# Patient Record
Sex: Female | Born: 1950 | Race: White | Hispanic: No | Marital: Single
Health system: Midwestern US, Community
[De-identification: ages and names within clinical notes are randomized; demographics above are authoritative.]

## PROBLEM LIST (undated history)

## (undated) DIAGNOSIS — J852 Abscess of lung without pneumonia: Principal | ICD-10-CM

## (undated) DIAGNOSIS — J851 Abscess of lung with pneumonia: Secondary | ICD-10-CM

---

## 2012-03-27 LAB — CBC WITH AUTOMATED DIFF
ABS. BASOPHILS: 0 10*3/uL (ref 0.0–0.06)
ABS. EOSINOPHILS: 0.2 10*3/uL (ref 0.0–0.4)
ABS. LYMPHOCYTES: 3 10*3/uL (ref 0.9–3.6)
ABS. MONOCYTES: 0.4 10*3/uL (ref 0.05–1.2)
ABS. NEUTROPHILS: 2.6 10*3/uL (ref 1.8–8.0)
BASOPHILS: 1 % (ref 0–2)
EOSINOPHILS: 3 % (ref 0–5)
HCT: 43.6 % (ref 35.0–45.0)
HGB: 14.9 g/dL (ref 12.0–16.0)
LYMPHOCYTES: 49 % (ref 21–52)
MCH: 32.5 PG (ref 24.0–34.0)
MCHC: 34.2 g/dL (ref 31.0–37.0)
MCV: 95 FL (ref 74.0–97.0)
MONOCYTES: 6 % (ref 3–10)
MPV: 10.2 FL (ref 9.2–11.8)
NEUTROPHILS: 41 % (ref 40–73)
PLATELET: 214 10*3/uL (ref 135–420)
RBC: 4.59 M/uL (ref 4.20–5.30)
RDW: 13.2 % (ref 11.6–14.5)
WBC: 6.2 10*3/uL (ref 4.6–13.2)

## 2012-03-27 LAB — URINALYSIS W/ RFLX MICROSCOPIC
Bilirubin: NEGATIVE
Blood: NEGATIVE
Glucose: NEGATIVE MG/DL
Ketone: NEGATIVE MG/DL
Leukocyte Esterase: NEGATIVE
Nitrites: NEGATIVE
Protein: NEGATIVE MG/DL
Specific gravity: 1.02 (ref 1.003–1.030)
Urobilinogen: 0.2 EU/DL (ref 0.2–1.0)
pH (UA): 5.5 (ref 5.0–8.0)

## 2012-03-27 LAB — METABOLIC PANEL, COMPREHENSIVE
A-G Ratio: 1.1 (ref 0.8–1.7)
ALT (SGPT): 20 U/L (ref 12.0–78.0)
AST (SGOT): 20 U/L (ref 15–37)
Albumin: 3.9 g/dL (ref 3.4–5.0)
Alk. phosphatase: 93 U/L (ref 50–136)
Anion gap: 5 mmol/L (ref 3.0–18)
BUN/Creatinine ratio: 21 — ABNORMAL HIGH (ref 12–20)
BUN: 18 MG/DL (ref 7.0–18)
Bilirubin, total: 0.4 MG/DL (ref 0.2–1.0)
CO2: 34 MMOL/L — ABNORMAL HIGH (ref 21–32)
Calcium: 9.1 MG/DL (ref 8.5–10.1)
Chloride: 103 MMOL/L (ref 100–108)
Creatinine: 0.84 MG/DL (ref 0.6–1.3)
GFR est AA: >60 mL/min/{1.73_m2} (ref >60)
GFR est non-AA: >60 mL/min/{1.73_m2} (ref >60)
Globulin: 3.6 g/dL (ref 2.0–4.0)
Glucose: 85 MG/DL (ref 74–99)
Potassium: 3.9 MMOL/L (ref 3.5–5.5)
Protein, total: 7.5 g/dL (ref 6.4–8.2)
Sodium: 142 MMOL/L (ref 136–145)

## 2012-03-27 LAB — CARDIAC PANEL,(CK, CKMB & TROPONIN)
CK - MB: 1.5 ng/ml (ref 0.5–3.6)
CK-MB Index: 1.3 % (ref 0.0–4.0)
CK: 116 U/L (ref 26–192)
Troponin-I, QT: <0.02 NG/ML (ref 0.00–0.06)

## 2012-03-27 LAB — AMYLASE: Amylase: 65 U/L (ref 25–115)

## 2012-03-27 LAB — LIPASE: Lipase: 174 U/L (ref 73–393)

## 2012-03-27 MED ORDER — ONDANSETRON (PF) 4 MG/2 ML INJECTION
4 mg/2 mL | Freq: Once | INTRAMUSCULAR | Status: AC
Start: 2012-03-27 — End: 2012-03-27
  Administered 2012-03-27: 23:00:00 via INTRAVENOUS

## 2012-03-27 MED ADMIN — sodium chloride 0.9 % bolus infusion 1,000 mL: INTRAVENOUS | @ 23:00:00 | NDC 00409798309

## 2012-03-27 MED FILL — SODIUM CHLORIDE 0.9 % IV: INTRAVENOUS | Qty: 1000

## 2012-03-27 NOTE — ED Notes (Signed)
Pt ambulatory to bathroom with steady gait to void.

## 2012-03-27 NOTE — ED Notes (Signed)
I have reviewed discharge instructions with the patient.  The patient verbalized understanding. Patient armband removed and shredded.

## 2012-03-27 NOTE — ED Notes (Signed)
No answer when called to triage room for bed placement

## 2012-03-27 NOTE — ED Provider Notes (Signed)
HPI Comments: 61 y.o. female presents to the ED C/O epigastric pain onset yesterday and worsening today. At bedside pt reports a 4/10 epigastric pain described as burning, stabbing, and associated with a "feeling of fullness." Pt states she has been experiencing "bubbling and rumbling" stomach noises that are so loud it wakes her up from sleep. Pt C/O subjective fever onset this morning. Pt reports Hx of GERD and HTN; PMHx of hysterectomy. Pt denies Hx of high cholesterol or DM; chest pain, SOB, and any further symptoms or complaints     (+) smoker; 6 cigarettes daily   (-) ETOH use      Patient is a 61 y.o. female presenting with abdominal pain. The history is provided by the patient. No language interpreter was used.   Abdominal Pain   This is a new problem. The problem has not changed since onset.The pain is located in the epigastric region. The quality of the pain is burning. The pain is at a severity of 4/10. The pain is moderate. Associated symptoms include a fever (subjective ) and nausea. Pertinent negatives include no diarrhea, no vomiting, no dysuria, no headaches, no arthralgias, no myalgias and no chest pain.        Past Medical History   Diagnosis Date   ??? Hypertension    ??? Gastrointestinal disorder    ??? GERD (gastroesophageal reflux disease)         Past Surgical History   Procedure Date   ??? Hx gyn          No family history on file.     History     Social History   ??? Marital Status: SINGLE     Spouse Name: N/A     Number of Children: N/A   ??? Years of Education: N/A     Occupational History   ??? Not on file.     Social History Main Topics   ??? Smoking status: Current Everyday Smoker   ??? Smokeless tobacco: Not on file   ??? Alcohol Use:    ??? Drug Use:    ??? Sexually Active:      Other Topics Concern   ??? Not on file     Social History Narrative   ??? No narrative on file                  ALLERGIES: Phenergan; Gluten; and Voltaren      Review of Systems   Constitutional: Positive for fever (subjective ).  Negative for fatigue.   HENT: Negative for sore throat and rhinorrhea.    Respiratory: Negative for cough and shortness of breath.    Cardiovascular: Negative for chest pain and palpitations.   Gastrointestinal: Positive for nausea and abdominal pain. Negative for vomiting and diarrhea.   Genitourinary: Negative for dysuria and difficulty urinating.   Musculoskeletal: Negative for myalgias and arthralgias.   Skin: Negative for color change and rash.   Neurological: Negative for light-headedness and headaches.   All other systems reviewed and are negative.        Filed Vitals:    03/27/12 1612 03/27/12 2021   BP: 166/92 172/74   Pulse: 86 79   Temp: 98.3 ??F (36.8 ??C)    Resp: 15 18   Height: 5' 4.5" (1.638 m)    Weight: 68.493 kg (151 lb)    SpO2: 98% 100%            Physical Exam   Vital signs and nursing notes reviewed  CONSTITUTIONAL: Alert, elderly female in no apparent distress; well-developed; well-nourished.  HEAD:  Normocephalic, atraumatic  EYES: PERRL; EOM's intact.  ENTM: Nose: no rhinorrhea; Throat: no erythema or exudate, mucous membranes moist  Neck:  No JVD, supple without lymphadenopathy  RESP: Chest clear, equal breath sounds.  CV: S1 and S2 WNL; No murmurs, gallops or rubs.  GI: Normal bowel sounds, abdomen soft with epigastric TTP and RUQ TTP. No masses or organomegaly.  GU: No costo-vertebral angle tenderness.  BACK:  Non-tender  UPPER EXT:  Normal inspection  LOWER EXT: No edema, no calf tenderness.  Distal pulses intact.  NEURO: CN intact, reflexes 2/4 and sym, strength 5/5 and sym, sensation intact.  SKIN: No rashes; Normal for age and stage.  PSYCH:  Alert and oriented, normal affect.      MDM     Differential Diagnosis; Clinical Impression; Plan:     DDX: GERD, gallstones, pancreatitis   Amount and/or Complexity of Data Reviewed:   Clinical lab tests:  Ordered and reviewed  Tests in the radiology section of CPT??:  Ordered and reviewed (US gallbladder )  Tests in the medicine section of the  CPT??:  Ordered and reviewed (EKG)   Obtain history from someone other than the patient:  No   Review and summarize past medical records:  Yes   Independant visualization of image, tracing, or specimen:  Yes (EKG)      Procedures    PROGRESS NOTE:   8:42 PM  Pt has been re-examined by Kenn File, MD. Pt is feeling better and pt is ready for discharge.   Written by Tresa Moore, ED Scribe, as dictated by Hetty Blend, MD .    Results    EKG interpretation: (Preliminary)  7:08 PM   NSR at 78 bpm; possible left atrial enlargement; non-specific changes   EKG read by Hetty Blend, MD at 18:56    Labs Reviewed   METABOLIC PANEL, COMPREHENSIVE - Abnormal; Notable for the following:     CO2 34 (*)      BUN/Creatinine ratio 21 (*)      All other components within normal limits   URINALYSIS W/ RFLX MICROSCOPIC   CBC WITH AUTOMATED DIFF   LIPASE   AMYLASE   CARDIAC PANEL,(CK, CKMB & TROPONIN)       Recent Results (from the past 12 hour(s))   URINALYSIS W/ RFLX MICROSCOPIC    Collection Time    03/27/12  4:15 PM       Component Value Range    Color YELLOW      Appearance CLEAR      Specific gravity 1.020  1.003 - 1.030      pH 5.5  5.0 - 8.0      Protein NEGATIVE   NEGATIVE MG/DL    Glucose NEGATIVE   NEGATIVE MG/DL    Ketone NEGATIVE   NEGATIVE MG/DL    Bilirubin NEGATIVE   NEGATIVE    Blood NEGATIVE   NEGATIVE    Urobilinogen 0.2  0.2 - 1.0 EU/DL    Nitrites NEGATIVE   NEGATIVE    Leukocyte Esterase NEGATIVE   NEGATIVE   CBC WITH AUTOMATED DIFF    Collection Time    03/27/12  6:45 PM       Component Value Range    WBC 6.2  4.6 - 13.2 K/uL    RBC 4.59  4.20 - 5.30 M/uL    HGB 14.9  12.0 - 16.0 g/dL    HCT  43.6  35.0 - 45.0 %    MCV 95.0  74.0 - 97.0 FL    MCH 32.5  24.0 - 34.0 PG    MCHC 34.2  31.0 - 37.0 g/dL    RDW 16.1  09.6 - 04.5 %    PLATELET 214  135 - 420 K/uL    MPV 10.2  9.2 - 11.8 FL    NEUTROPHILS 41  40 - 73 %    LYMPHOCYTES 49  21 - 52 %    MONOCYTES 6  3 - 10 %    EOSINOPHILS 3  0 - 5 %     BASOPHILS 1  0 - 2 %    ABS. NEUTROPHILS 2.6  1.8 - 8.0 K/UL    ABS. LYMPHOCYTES 3.0  0.9 - 3.6 K/UL    ABS. MONOCYTES 0.4  0.05 - 1.2 K/UL    ABS. EOSINOPHILS 0.2  0.0 - 0.4 K/UL    ABS. BASOPHILS 0.0  0.0 - 0.06 K/UL    DF AUTOMATED     METABOLIC PANEL, COMPREHENSIVE    Collection Time    03/27/12  6:45 PM       Component Value Range    Sodium 142  136 - 145 MMOL/L    Potassium 3.9  3.5 - 5.5 MMOL/L    Chloride 103  100 - 108 MMOL/L    CO2 34 (*) 21 - 32 MMOL/L    Anion gap 5  3.0 - 18 mmol/L    Glucose 85  74 - 99 MG/DL    BUN 18  7.0 - 18 MG/DL    Creatinine 4.09  0.6 - 1.3 MG/DL    BUN/Creatinine ratio 21 (*) 12 - 20      GFR est AA >60  >60 ml/min/1.25m2    GFR est non-AA >60  >60 ml/min/1.10m2    Calcium 9.1  8.5 - 10.1 MG/DL    Bilirubin, total 0.4  0.2 - 1.0 MG/DL    ALT 20  81.1 - 91.4 U/L    AST 20  15 - 37 U/L    Alk. phosphatase 93  50 - 136 U/L    Protein, total 7.5  6.4 - 8.2 g/dL    Albumin 3.9  3.4 - 5.0 g/dL    Globulin 3.6  2.0 - 4.0 g/dL    A-G Ratio 1.1  0.8 - 1.7     LIPASE    Collection Time    03/27/12  6:45 PM       Component Value Range    Lipase 174  73 - 393 U/L   AMYLASE    Collection Time    03/27/12  6:45 PM       Component Value Range    Amylase 65  25 - 115 U/L   CARDIAC PANEL,(CK, CKMB & TROPONIN)    Collection Time    03/27/12  6:45 PM       Component Value Range    CK 116  26 - 192 U/L    CK - MB 1.5  0.5 - 3.6 ng/ml    CK-MB Index 1.3  0.0 - 4.0 %    Troponin-I, Qt. <0.02  0.00 - 0.06 NG/ML   EKG, 12 LEAD, INITIAL    Collection Time    03/27/12  6:56 PM       Component Value Range    Ventricular Rate 78      Atrial Rate 78      P-R  Interval 174      QRS Duration 78      Q-T Interval 378      QTC Calculation (Bezet) 430      Calculated P Axis 62      Calculated R Axis 7      Calculated T Axis 70      Diagnosis        Value: Normal sinus rhythm      Possible Left atrial enlargement      Anterior myocardial infarction , age undetermined      Abnormal ECG      No previous ECGs available       Confirmed by [id number], Fenton Foy MD [phone]) on 03/27/2012 8:35:09 PM       CLINICAL IMPRESSION    1. GERD (gastroesophageal reflux disease)      8:42 PM  [facility]'s  results have been reviewed with her.  She has been counseled regarding her diagnosis, treatment, and plan.  She verbally conveys understanding and agreement of the signs, symptoms, diagnosis, treatment and prognosis and additionally agrees to follow up as discussed.  She also agrees with the care-plan and conveys that all of her questions have been answered.  I have also provided discharge instructions for her that include: educational information regarding their diagnosis and treatment, and list of reasons why they would want to return to the ED prior to their follow-up appointment, should her condition change.      Written by Tresa Moore, ED Scribe, as dictated by Hetty Blend, MD

## 2012-03-27 NOTE — ED Notes (Signed)
Report given to b. Walls rn.

## 2012-03-27 NOTE — ED Notes (Signed)
Pt upset, ready to leave. Awaiting MD to talk to her about [place] results. Dr. Sandi Mariscal notified.

## 2012-03-27 NOTE — ED Notes (Signed)
Explanation of wait provided to patient/caregiver at this time. Patient expressed understanding and is to notify triage nurse of any new or worsening symptoms.

## 2012-03-27 NOTE — ED Notes (Signed)
Patient states "my stomach hurts" "I get nauseous when I eat solid foods" "its bubbling and burning" "

## 2012-03-27 NOTE — ED Notes (Signed)
Assumed care of pt. [place] bedside doing gallbladder US at this time. Will reassess when [place] is finished. Call bell within reach. Will continue to monitor.

## 2012-03-27 NOTE — ED Notes (Signed)
Pt resting on stretcher a/o answering questions approp. Reports 4/10 epigastric pain that started last night. Pt reports this occuring before but thinking it was her acid reflux. Pt reports increased pain upon palpations. Pt reports feeling nauseous but denies v/d. Last bm today.

## 2012-03-27 NOTE — ED Notes (Signed)
Dr. Sandi Mariscal bedside discussing lab results and [place] results and need for follow up. Pt verbalizes understanding.

## 2012-03-28 LAB — EKG, 12 LEAD, INITIAL
Atrial Rate: 78 {beats}/min
Calculated P Axis: 62 degrees
Calculated R Axis: 7 degrees
Calculated T Axis: 70 degrees
Diagnosis: NORMAL
P-R Interval: 174 ms
Q-T Interval: 378 ms
QRS Duration: 78 ms
QTC Calculation (Bezet): 430 ms
Ventricular Rate: 78 {beats}/min

## 2012-03-28 MED ORDER — OMEPRAZOLE 20 MG CAP, DELAYED RELEASE
20 mg | ORAL_CAPSULE | Freq: Two times a day (BID) | ORAL | Status: DC
Start: 2012-03-28 — End: 2016-05-09

## 2016-05-02 ENCOUNTER — Inpatient Hospital Stay

## 2016-05-02 ENCOUNTER — Inpatient Hospital Stay
Admit: 2016-05-02 | Discharge: 2016-05-07 | Disposition: A | Discharge status: Home / Self Care | Payer: MEDICARE | Attending: Family Medicine | Admitting: Family Medicine

## 2016-05-02 ENCOUNTER — Emergency Department: Admit: 2016-05-02 | Payer: MEDICARE | Primary: Family Medicine

## 2016-05-02 DIAGNOSIS — J852 Abscess of lung without pneumonia: Principal | ICD-10-CM

## 2016-05-02 LAB — URINALYSIS W/ RFLX MICROSCOPIC
Bilirubin: NEGATIVE
Blood: NEGATIVE
Glucose: NEGATIVE mg/dL
Ketone: 15 mg/dL — AB
Leukocyte Esterase: NEGATIVE
Nitrites: NEGATIVE
Protein: NEGATIVE mg/dL
Specific gravity: 1.013 (ref 1.003–1.030)
Urobilinogen: 1 EU/dL (ref 0.2–1.0)
pH (UA): 5.5 (ref 5.0–8.0)

## 2016-05-02 LAB — LIPASE: Lipase: 80 U/L (ref 73–393)

## 2016-05-02 LAB — METABOLIC PANEL, COMPREHENSIVE
A-G Ratio: 0.5 — ABNORMAL LOW (ref 0.8–1.7)
ALT (SGPT): 19 U/L (ref 13–56)
AST (SGOT): 19 U/L (ref 15–37)
Albumin: 2.4 g/dL — ABNORMAL LOW (ref 3.4–5.0)
Alk. phosphatase: 93 U/L (ref 45–117)
Anion gap: 9 mmol/L (ref 3.0–18)
BUN/Creatinine ratio: 17 (ref 12–20)
BUN: 11 MG/DL (ref 7.0–18)
Bilirubin, total: 0.6 MG/DL (ref 0.2–1.0)
CO2: 28 mmol/L (ref 21–32)
Calcium: 8.7 MG/DL (ref 8.5–10.1)
Chloride: 98 mmol/L — ABNORMAL LOW (ref 100–108)
Creatinine: 0.64 MG/DL (ref 0.6–1.3)
GFR est AA: >60 mL/min/{1.73_m2} (ref >60)
GFR est non-AA: >60 mL/min/{1.73_m2} (ref >60)
Globulin: 5 g/dL — ABNORMAL HIGH (ref 2.0–4.0)
Glucose: 104 mg/dL — ABNORMAL HIGH (ref 74–99)
Potassium: 3.9 mmol/L (ref 3.5–5.5)
Protein, total: 7.4 g/dL (ref 6.4–8.2)
Sodium: 135 mmol/L — ABNORMAL LOW (ref 136–145)

## 2016-05-02 LAB — CBC WITH AUTOMATED DIFF
ABS. BASOPHILS: 0.1 10*3/uL — ABNORMAL HIGH (ref 0.0–0.06)
ABS. EOSINOPHILS: 0.1 10*3/uL (ref 0.0–0.4)
ABS. LYMPHOCYTES: 1.8 10*3/uL (ref 0.9–3.6)
ABS. MONOCYTES: 1.2 10*3/uL (ref 0.05–1.2)
ABS. NEUTROPHILS: 11.3 10*3/uL — ABNORMAL HIGH (ref 1.8–8.0)
BASOPHILS: 0 % (ref 0–2)
EOSINOPHILS: 1 % (ref 0–5)
HCT: 37.2 % (ref 35.0–45.0)
HGB: 12.8 g/dL (ref 12.0–16.0)
LYMPHOCYTES: 12 % — ABNORMAL LOW (ref 21–52)
MCH: 31.8 PG (ref 24.0–34.0)
MCHC: 34.4 g/dL (ref 31.0–37.0)
MCV: 92.3 FL (ref 74.0–97.0)
MONOCYTES: 9 % (ref 3–10)
MPV: 10.1 FL (ref 9.2–11.8)
NEUTROPHILS: 78 % — ABNORMAL HIGH (ref 40–73)
PLATELET: 327 10*3/uL (ref 135–420)
RBC: 4.03 M/uL — ABNORMAL LOW (ref 4.20–5.30)
RDW: 12.9 % (ref 11.6–14.5)
WBC: 14.4 10*3/uL — ABNORMAL HIGH (ref 4.6–13.2)

## 2016-05-02 LAB — CARDIAC PANEL,(CK, CKMB & TROPONIN)
CK - MB: <1 ng/ml (ref <3.6)
CK: 87 U/L (ref 26–192)
Troponin-I, QT: <0.02 NG/ML (ref 0.00–0.06)

## 2016-05-02 LAB — EKG, 12 LEAD, INITIAL
Atrial Rate: 83 {beats}/min
Calculated P Axis: 61 degrees
Calculated R Axis: -21 degrees
Calculated T Axis: 64 degrees
Diagnosis: NORMAL
P-R Interval: 162 ms
Q-T Interval: 378 ms
QRS Duration: 90 ms
QTC Calculation (Bezet): 444 ms
Ventricular Rate: 83 {beats}/min

## 2016-05-02 LAB — HIV 1/2 RAPID SCREEN
HIV RAPID SCR 1/2,RHIV12: NEGATIVE
HIV-1/2 rapid screen: NEGATIVE

## 2016-05-02 LAB — MAGNESIUM: Magnesium: 1.8 mg/dL (ref 1.6–2.6)

## 2016-05-02 LAB — EKG 12-LEAD
Atrial Rate: 83 {beats}/min
Diagnosis: NORMAL
P Axis: 61 degrees
P-R Interval: 162 ms
Q-T Interval: 378 ms
QRS Duration: 90 ms
QTc Calculation (Bazett): 444 ms
R Axis: -21 degrees
T Axis: 64 degrees
Ventricular Rate: 83 {beats}/min

## 2016-05-02 MED ORDER — PROPRANOLOL 20 MG TAB
20 mg | Freq: Three times a day (TID) | ORAL | Status: DC
Start: 2016-05-02 — End: 2016-05-07
  Administered 2016-05-03 – 2016-05-07 (×12): via ORAL

## 2016-05-02 MED ORDER — SODIUM CHLORIDE 0.9 % IV
INTRAVENOUS | Status: DC
Start: 2016-05-02 — End: 2016-05-02
  Administered 2016-05-02: 19:00:00 via INTRAVENOUS

## 2016-05-02 MED ORDER — IOPAMIDOL 61 % IV SOLN
300 mg iodine /mL (61 %) | Freq: Once | INTRAVENOUS | Status: AC
Start: 2016-05-02 — End: 2016-05-02
  Administered 2016-05-02: 14:00:00 via INTRAVENOUS

## 2016-05-02 MED ORDER — PIPERACILLIN-TAZOBACTAM 4.5 GRAM IV SOLR
4.5 gram | Freq: Four times a day (QID) | INTRAVENOUS | Status: DC
Start: 2016-05-02 — End: 2016-05-06
  Administered 2016-05-03 – 2016-05-06 (×16): via INTRAVENOUS

## 2016-05-02 MED ORDER — SODIUM CHLORIDE 0.9% BOLUS IV
0.9 % | Freq: Once | INTRAVENOUS | Status: AC
Start: 2016-05-02 — End: 2016-05-07
  Administered 2016-05-02: 12:00:00 via INTRAVENOUS

## 2016-05-02 MED ORDER — IPRATROPIUM-ALBUTEROL 2.5 MG-0.5 MG/3 ML NEB SOLUTION
2.5 mg-0.5 mg/3 ml | RESPIRATORY_TRACT | Status: DC | PRN
Start: 2016-05-02 — End: 2016-05-07

## 2016-05-02 MED ORDER — HEPARIN (PORCINE) 5,000 UNIT/ML IJ SOLN
5000 unit/mL | Freq: Three times a day (TID) | INTRAMUSCULAR | Status: DC
Start: 2016-05-02 — End: 2016-05-07
  Administered 2016-05-02 – 2016-05-07 (×11): via SUBCUTANEOUS

## 2016-05-02 MED ORDER — SODIUM CHLORIDE 0.9 % IV PIGGY BACK
4.5 gram | INTRAVENOUS | Status: AC
Start: 2016-05-02 — End: 2016-05-07
  Administered 2016-05-02: 17:00:00 via INTRAVENOUS

## 2016-05-02 MED ORDER — VANCOMYCIN 10 GRAM IV SOLR
10 gram | Freq: Once | INTRAVENOUS | Status: AC
Start: 2016-05-02 — End: 2016-05-07
  Administered 2016-05-02: 19:00:00 via INTRAVENOUS

## 2016-05-02 MED ORDER — PHARMACY INFORMATION NOTE
Status: DC
Start: 2016-05-02 — End: 2016-05-02

## 2016-05-02 MED ORDER — SODIUM CHLORIDE 0.9 % IV
INTRAVENOUS | Status: AC
Start: 2016-05-02 — End: 2016-05-04
  Administered 2016-05-03 (×3): via INTRAVENOUS

## 2016-05-02 MED ORDER — SODIUM CHLORIDE 0.9 % IV PIGGY BACK
1000 mg | Freq: Two times a day (BID) | INTRAVENOUS | Status: DC
Start: 2016-05-02 — End: 2016-05-02

## 2016-05-02 MED FILL — PIPERACILLIN-TAZOBACTAM 4.5 GRAM IV SOLR: 4.5 gram | INTRAVENOUS | Qty: 4.5

## 2016-05-02 MED FILL — VANCOMYCIN 10 GRAM IV SOLR: 10 gram | INTRAVENOUS | Qty: 1250

## 2016-05-02 MED FILL — SODIUM CHLORIDE 0.9 % IV: INTRAVENOUS | Qty: 1000

## 2016-05-02 MED FILL — PHARMACY INFORMATION NOTE: Qty: 1

## 2016-05-02 MED FILL — ISOVUE-300  61 % INTRAVENOUS SOLUTION: 300 mg iodine /mL (61 %) | INTRAVENOUS | Qty: 100

## 2016-05-02 MED FILL — HEPARIN (PORCINE) 5,000 UNIT/ML IJ SOLN: 5000 unit/mL | INTRAMUSCULAR | Qty: 1

## 2016-05-02 NOTE — ED Provider Notes (Signed)
[facility]  EMERGENCY DEPARTMENT HISTORY AND PHYSICAL EXAM       Date: 05/02/2016   Patient Name: Denise [name]   Date of Birth: [birth year]  Medical Record Number: [id number]    History of Presenting Illness     Chief Complaint   Patient presents with   ??? Cough   ??? Lethargy        History Provided By:  patient    Additional History: 7:13 AM   Denise [name] is a 65 y.o. female who presents to the emergency department C/O fatigue and cough productive of "milky" phlegm. Associated sxs include decreased appetite, decreased sense of taste, weight loss, nausea, and vomiting (1 episode) starting 1.5 weeks ago. Pt has been evaluated at [facility] and was rx'd Doxycycline for Bronchitis. Other sxs include occasional chest pressure and SOB starting "years ago". Medical allergies to Phenergan. Hx of coccidioidomycosis and surgery. Reports recent cigarette use cessation 1.5 weeks ago. Denies chest pain, dysuria, increased/decreased urination, and any other sxs or complaints.     Primary Care Provider: Hyman Bower, MD   Specialist:    Past History     Past Medical History:   Past Medical History:   Diagnosis Date   ??? Gastrointestinal disorder    ??? GERD (gastroesophageal reflux disease)    ??? Hypertension         Past Surgical History:   Past Surgical History:   Procedure Laterality Date   ??? HX GYN          Family History:   History reviewed. No pertinent family history.     Social History:   Social History   Substance Use Topics   ??? Smoking status: Former Smoker     Quit date: 04/19/2016   ??? Smokeless tobacco: None   ??? Alcohol use None        Allergies:   Allergies   Allergen Reactions   ??? Phenergan [Promethazine] Anaphylaxis   ??? Gluten Other (comments)   ??? Voltaren [Diclofenac Sodium] Other (comments)     syncope        Review of Systems   Review of Systems   Constitutional: Positive for appetite change (decreased), fatigue, fever and unexpected weight change (weight loss).    HENT: Negative for rhinorrhea and sore throat.    Respiratory: Positive for cough. Negative for shortness of breath.    Cardiovascular: Negative for chest pain and palpitations.   Gastrointestinal: Positive for nausea. Negative for abdominal pain, diarrhea and vomiting.   Genitourinary: Negative for decreased urine volume, difficulty urinating, dysuria and frequency.   Musculoskeletal: Negative for arthralgias and myalgias.   Skin: Negative for color change and rash.   Neurological: Negative for light-headedness and headaches.   All other systems reviewed and are negative.      Physical Exam  Vitals:    05/02/16 1000 05/02/16 1035 05/02/16 1045 05/02/16 1100   BP: 135/54 157/81 147/53 152/67   Pulse:       Resp:       Temp:       SpO2: 99% 100% 99% 99%   Weight:       Height:           Physical Exam   Nursing note and vitals reviewed.  Vital signs and nursing notes reviewed    CONSTITUTIONAL: Alert, in no apparent distress; well-developed; well-nourished.  HEAD:  Normocephalic, atraumatic  EYES: PERRL; EOM's intact.  ENTM: Nose: no rhinorrhea; Throat: no  erythema or exudate, mucous membranes moist  Neck:  No JVD, supple without lymphadenopathy  RESP: Rhonchi in chest  CV: S1 and S2 WNL; No murmurs, gallops or rubs.  GI: Normal bowel sounds, abdomen soft with epigastric tenderness. No masses or organomegaly.  GU: No costo-vertebral angle tenderness.  BACK:  Non-tender  UPPER EXT:  Normal inspection  LOWER EXT: No edema, no calf tenderness.  Distal pulses intact.  NEURO: CN intact, reflexes 2/4 and sym, strength 5/5 and sym, sensation intact.  SKIN: No rashes; Normal for age and stage.  PSYCH:  Alert and oriented, normal affect.     Diagnostic Study Results     Labs -      Recent Results (from the past 12 hour(s))   EKG, 12 LEAD, INITIAL    Collection Time: 05/02/16  7:37 AM   Result Value Ref Range    Ventricular Rate 83 BPM    Atrial Rate 83 BPM    P-R Interval 162 ms    QRS Duration 90 ms    Q-T Interval 378 ms     QTC Calculation (Bezet) 444 ms    Calculated P Axis 61 degrees    Calculated R Axis -21 degrees    Calculated T Axis 64 degrees    Diagnosis       Normal sinus rhythm  Normal ECG  When compared with ECG of 27-Mar-2012 18:56,  Criteria for Anteroseptal infarct are no longer present     CBC WITH AUTOMATED DIFF    Collection Time: 05/02/16  7:50 AM   Result Value Ref Range    WBC 14.4 (H) 4.6 - 13.2 K/uL    RBC 4.03 (L) 4.20 - 5.30 M/uL    HGB 12.8 12.0 - 16.0 g/dL    HCT 37.2 35.0 - 45.0 %    MCV 92.3 74.0 - 97.0 FL    MCH 31.8 24.0 - 34.0 PG    MCHC 34.4 31.0 - 37.0 g/dL    RDW 12.9 11.6 - 14.5 %    PLATELET 327 135 - 420 K/uL    MPV 10.1 9.2 - 11.8 FL    NEUTROPHILS 78 (H) 40 - 73 %    LYMPHOCYTES 12 (L) 21 - 52 %    MONOCYTES 9 3 - 10 %    EOSINOPHILS 1 0 - 5 %    BASOPHILS 0 0 - 2 %    ABS. NEUTROPHILS 11.3 (H) 1.8 - 8.0 K/UL    ABS. LYMPHOCYTES 1.8 0.9 - 3.6 K/UL    ABS. MONOCYTES 1.2 0.05 - 1.2 K/UL    ABS. EOSINOPHILS 0.1 0.0 - 0.4 K/UL    ABS. BASOPHILS 0.1 (H) 0.0 - 0.06 K/UL    DF AUTOMATED     LIPASE    Collection Time: 05/02/16  8:45 AM   Result Value Ref Range    Lipase 80 73 - 393 U/L   CARDIAC PANEL,(CK, CKMB & TROPONIN)    Collection Time: 05/02/16  8:45 AM   Result Value Ref Range    CK 87 26 - 192 U/L    CK - MB <1.0 <3.6 ng/ml    CK-MB Index Cannot be calulated 0.0 - 4.0 %    Troponin-I, Qt. <0.02 0.00 - 0.06 NG/ML   MAGNESIUM    Collection Time: 05/02/16  8:45 AM   Result Value Ref Range    Magnesium 1.8 1.6 - 2.6 mg/dL   METABOLIC PANEL, COMPREHENSIVE    Collection Time: 05/02/16  8:45  AM   Result Value Ref Range    Sodium 135 (L) 136 - 145 mmol/L    Potassium 3.9 3.5 - 5.5 mmol/L    Chloride 98 (L) 100 - 108 mmol/L    CO2 28 21 - 32 mmol/L    Anion gap 9 3.0 - 18 mmol/L    Glucose 104 (H) 74 - 99 mg/dL    BUN 11 7.0 - 18 MG/DL    Creatinine 0.64 0.6 - 1.3 MG/DL    BUN/Creatinine ratio 17 12 - 20      GFR est AA >60 >60 ml/min/1.66m    GFR est non-AA >60 >60 ml/min/1.785m    Calcium 8.7 8.5 - 10.1 MG/DL    Bilirubin, total 0.6 0.2 - 1.0 MG/DL    ALT (SGPT) 19 13 - 56 U/L    AST (SGOT) 19 15 - 37 U/L    Alk. phosphatase 93 45 - 117 U/L    Protein, total 7.4 6.4 - 8.2 g/dL    Albumin 2.4 (L) 3.4 - 5.0 g/dL    Globulin 5.0 (H) 2.0 - 4.0 g/dL    A-G Ratio 0.5 (L) 0.8 - 1.7     URINALYSIS W/ RFLX MICROSCOPIC    Collection Time: 05/02/16  9:00 AM   Result Value Ref Range    Color YELLOW      Appearance CLEAR      Specific gravity 1.013 1.003 - 1.030      pH (UA) 5.5 5.0 - 8.0      Protein NEGATIVE  NEG mg/dL    Glucose NEGATIVE  NEG mg/dL    Ketone 15 (A) NEG mg/dL    Bilirubin NEGATIVE  NEG      Blood NEGATIVE  NEG      Urobilinogen 1.0 0.2 - 1.0 EU/dL    Nitrites NEGATIVE  NEG      Leukocyte Esterase NEGATIVE  NEG         Radiologic Studies -  The following have been ordered and reviewed:  CT CHEST W CONT   Final Result   1. Abnormal radiographic opacity corresponds to a large area of abnormal  pulmonary parenchymal opacity with focal areas of hypoattenuation soft tissue  gas present within the superior segment of the right lower lobe, without  crossing the adjacent right major fissure. Overall imaging appearance is most  suggestive of a abscess forming pulmonary infectious process, with underlying  malignancy difficult to exclude on the basis of current imaging. No associated  pleural effusion is present. The adjacent subpleural fat, and lateral right ribs  are normal in appearance. Recommend cross-sectional imaging follow-up to  document expected resolution following treatment.  ??  2. Increased number of mediastinal lymph nodes, with likely reactive subcarinal  Adenopathy.  As read by the radiologist.    XR CHEST PA LAT   Final Result   1. Rounded area of opacity at the right lung base with associated suture  material. Unclear this pertains to a loculation of pleural fluid or pseudotumor  with associated granuloma formation, or underlying parenchymal lesion.   Correlation with surgical history and prior cross-sectional imaging of the chest  recommended if available. No acute radiographic cardiopulmonary abnormality is  Present.  As read by the radiologist.    [place]   Final Result   1. No sonographic evidence of acute cholecystitis or biliary dilatation. No  cholelithiasis.  2. 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  As read  by the radiologist.            Medical Decision Making   I am the first provider for this patient.     I reviewed the vital signs, available nursing notes, past medical history, past surgical history, family history and social history.     Vital Signs-Reviewed the patient's vital signs.   Patient Vitals for the past 12 hrs:   Temp Pulse Resp BP SpO2   05/02/16 1100 - - - 152/67 99 %   05/02/16 1045 - - - 147/53 99 %   05/02/16 1035 - - - 157/81 100 %   05/02/16 1000 - - - 135/54 99 %   05/02/16 0927 - - - - 97 %   05/02/16 0925 - - - 110/83 -   05/02/16 0845 - 79 20 133/44 99 %   05/02/16 0832 - 83 16 122/56 100 %   05/02/16 0806 - - - - 97 %   05/02/16 0700 98.2 ??F (36.8 ??C) 90 18 158/78 98 %       Pulse Oximetry Analysis - Normal 98% on room air     Old Medical Records: Nursing notes.     Provider Notes:   INITIAL CLINICAL IMPRESSION and PLANS:  The patient presents with the primary complaint(s) of: fatigue. The presentation, to include historical aspects and clinical findings are consistent with the DX of gallstones. However, other possible DX's to consider as primary, associated with, or exacerbated by include:    1.  Pancreatitis  2.  Bronchitis  3.  Electrolyte abnormality  4.  Anemia    Considering the above, my initial management plan to evaluate and therapeutic interventions include the following and as noted in the orders:    1.  Labs: Urinalysis, Cardiac Panel, Lipase, Magnesium, CMP, CBC  2.  Imaging: US gallbladder, Chest X-ray, EKG, CT Chest with contrast     EKG interpretation: (Preliminary)  NSR. Rate 83 bpm. Normal QTC 444 ms.   EKG read by Jackelyn Hoehn, MD at 7:37 AM     Procedures:   Procedures    ED Course:  7:13 AM  Initial assessment performed. The patients presenting problems have been discussed, and they are in agreement with the care plan formulated and outlined with them.  I have encouraged them to ask questions as they arise throughout their visit.    10:00 AM I received a call from radiology regarding the patient's Chest X-ray. He states that there is a nodule in the right base of the lung and recommends ordering a CT chest with contrast.     11:24 AM I reviewed the patient???s CT results and I am concerned for a lung abscess. Will consult Pulmonary. Of note, the patient did not meet SIRS criteria as she had no fever, no heart rate greater than 90, no respiration greater than 20. Her white count was elevated. The initial Chest X-ray did not show probable pneumonia. Blood cultures will be ordered. Will await final disposition based on discussion with Pulmonary.     11:28 AM Discussed patient's history, exam, and available diagnostics results with Belva Chimes, MD, Pulmonology, who states that if there is concern for lung abscess, she should be admitted for IV antibiotics. He will be available if the hospitalist would like to consult.     11:47 AM Discussed patient's history, exam, and available diagnostics results with Lonia Blood, MD, internal medicine, who agrees to admit the patient to Medical    11:47 AM  Patient is being admitted to the hospital by Lonia Blood, MD to Medical. The results of their tests and reasons for their admission have been discussed with them and/or available family. They convey agreement and understanding for the need to be admitted and for their admission diagnosis.      CONDITIONS ON ADMISSION:  Deep Vein Thrombosis is not present at the time of admission. Thrombosis  is not present at the time of admission. Urinary Tract Infection is not present at the time of admission. Pneumonia is not present at the time of admission. MRSA is not present at the time of admission. Wound infection is not present at the time of admission. Pressure Ulcer is not present at the time of admission.      Medications Given in the ED:  Medications   piperacillin-tazobactam (ZOSYN) 4.5 g in 0.9% sodium chloride (MBP/ADV) 100 mL MBP (not administered)   0.9% sodium chloride infusion (not administered)   sodium chloride 0.9 % bolus infusion 1,000 mL (1,000 mL IntraVENous New Bag 05/02/16 0758)   iopamidol (ISOVUE 300) 61 % contrast injection 50-100 mL (100 mL IntraVENous Given 05/02/16 1022)       Diagnosis   Clinical Impression:   1. Abscess of upper lobe of right lung without pneumonia [facility])         Discussion:  Patient presents with cough and fatigue. She denied any fevers. She did not meet SIRS criteria and had a white count of 14. Her chest X-ray did not show pneumonia, but showed some sort of mass. CT showed lung abscess, malignancy cannot be excluded. A blood culture was done and she will be admitted with consultation with Pulmonary and Infectious Disease.   _______________________________   Attestations:     This note is prepared by Ned Clines, acting as a Scribe for Jackelyn Hoehn, MD on 7:13 AM on 05/02/2016 .    Jackelyn Hoehn, MD: The scribe's documentation has been prepared under my direction and personally reviewed by me in its entirety.  _______________________________

## 2016-05-02 NOTE — ED Notes (Signed)
Dr Barbee Coughpak at cartside examining and consulting with pt

## 2016-05-02 NOTE — Progress Notes (Signed)
Received patient from the ED.    1345 patient assessment was completed at this time.  Patient states that she is a little concerned about her situations, talked with patient during this time the patient calmed down.      Shift summary- rest of shift was uneventful.

## 2016-05-02 NOTE — Progress Notes (Addendum)
2017-Assessment complete at this time. Pt A&O x4. Lung sounds clear with diminished sounds on left lower base. Productive, hacking cough. Pt has +PP bilaterally. Pt denies tingling and numbness in LE's. Pain reported a  0/10 on pain scale. 20G to left ac patent and infusing. Skin warm and dry. Abdomen soft and non-tender. Bowel sounds active x4 quadrants WDL's. Patient resting with bed in lowest position. Call light in reach.     2051-Sputum sample collected and sent to lab    2330-Paged and spoke with Dr. Eula ListenHussain about pt's fever(102.2). 650mg  PO Tylenol PRN ordered.    2445-650mg  PO Tylenol given    0130-IV infiltration at left ac site. Mild swelling. IV removed and pt given ice pack. Another IV site started, 22G to right forearm. See flow sheet for details    0315-Shift reassessment complete at this time. No changes noted to previous assessment. See flow sheet for details. Pt resting with bed in lowest position. Call light in reach.     End of shift summary. Pt had uneventful shift. Pain was well controlled with PRN medications. Pt ambulates x1 assist to restroom. 0500 Heparin held due to progress notes for possible procedure today. Pt resting with no needs or signs of distress at this time. Call light in reach.

## 2016-05-02 NOTE — Consults (Signed)
ID Consult  Dictated #[id number]    Thanks.  Curley Fayette, MD

## 2016-05-02 NOTE — Consults (Signed)
[facility]  CONSULTATIONS    Name:  Denise [name], Denise [name]  MR#:  [id number]  DOB:  01/27/1951  Account #:  [id number]  Date of Adm:  05/02/2016  Date of Consultation:      REASON FOR CONSULTATION: A 65 year old female referred by Dr.  Dinah BeersGuan for further evaluation and management of lung abscess.    RECOMMENDATIONS:  1. Lung abscess: This patient reports foul tasting and smelling sputum  production along with systemic complaints of sweats, anorexia, and  weight loss. She has longstanding history of tobacco abuse. CT image  with evidence of right lower lobe abscess. Gas is seen.  2. Associated lymphadenopathy. Of concern is malignancy  complicating this syndrome. This patient does not have obvious risk  factors of alcohol abuse or poor dentition to increase her risk of  pyogenic lung abscess alone.    As such, we will continue empiric Zosyn. Will check sputum. Will  pursue PICC line placement as ordinarily this syndrome requires  several weeks of parenteral antibiotics. I suspect a bronchoscopy will  be worthwhile to exclude underlying malignancy at some point.    Thank you for allowing us to participate in the care of your patient. Will  continue to follow with you.    HISTORY OF PRESENT ILLNESS: The patient is a 65 year old  female with longstanding history of tobacco abuse, who states over the  last several weeks to have had progressive anorexia, cough, and foul  tasting sputum. The patient developed some modest abdominal  discomfort related to the repeated cough. [facility] and was [facility]. She has undergone CT  imaging demonstrating evidence of lung abscess. She has been  started on broad-spectrum antimicrobial therapy.    Presently, the patient states she feels no worse. She has some  associated headache. There are no complaints of diplopia, facial pain,  mouth pain, or neck pain. There is no significant hemoptysis. No   palpitations. She complains of modest abdominal discomfort generally  that has not worsened. There has been no associated nausea,  vomiting, diarrhea, dysuria, urgency, frequency, additional myalgias,  arthralgias, or skin rash. She does complain of weight loss,  though cannot quantify. Full 12-point review of systems was performed  and unless otherwise specifically stated above, was negative.    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 She complains of hypertension. No known history of  diabetes, heart disease, or chronic lung disease. 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  This  appears to have been treated with surgical resection alone.    ALLERGIES: THE PATIENT REPORTS ALLERGY OF AUGMENTIN  CAUSING STOMACH UPSET.    SOCIAL HISTORY: The patient quit smoking 2 weeks ago. Denies  alcohol abuse. Works at [employer]. Denies other unusual environmental  exposures.    REVIEW OF SYSTEMS  As above.    PHYSICAL EXAMINATION  GENERAL: Awake, alert, oriented x3, in no acute distress.  VITAL SIGNS: Temperature 99.0, blood pressure 160/70, heart rate  100.  HEENT: Mild temporal wasting. No conjunctival lesions or icterus. Oral  cavity clear, no thrush.  NECK: Supple. No JVD or adenopathy.  CHEST: Scattered bibasilar crackles, no external wheezes, right  greater than left, with dullness to percussion.  CARDIOVASCULAR: Normal S1, S2. No murmurs.  ABDOMEN: Soft, nontender. No rebound or guarding, no  hepatosplenomegaly, no masses.  GENITOURINARY: No inguinal adenopathy. No lesions.  EXTREMITIES: Trace distal edema. No rash or petechiae.  LABORATORY: Chest CT independently reviewed.    White blood cells 14,000, hemoglobin 12.8, platelet count 327,000.  Sodium 135, potassium 3.9, chloride 98, bicarbonate 28, BUN 11,  creatinine 0.6. Liver enzymes are normal. Blood cultures are sterile to  date.    CT independently reviewed.        Marguerita Merles, MD     DK / MS1  D:  05/02/2016   19:58  T:  05/02/2016   20:20  Job #:  [id number]

## 2016-05-02 NOTE — Progress Notes (Signed)
Chaplain conducted an initial consultation and Spiritual Assessment for Denise [name], who is a 65 y.o.,female. Patient???s Primary Language is: [place].   According to the patient???s EMR Religious Affiliation is: Catholic.     The reason the Patient came to the hospital is:   Patient Active Problem List    Diagnosis Date Noted   ??? Lung abscess without pneumonia (HCC) 05/02/2016   ??? COPD (chronic obstructive pulmonary disease) (HCC) 05/02/2016   ??? Weight loss 05/02/2016   ??? Celiac disease 05/02/2016        The Chaplain provided the following Interventions:  Initiated a relationship of care and support.   Explored issues of faith, belief, spirituality and religious/ritual needs while hospitalized.  Listened empathically.  Provided chaplaincy education.  Provided information about Spiritual Care Services.  Offered prayer and assurance of continued prayers on patient's behalf.   Chart reviewed.    The following outcomes were achieved:  Patient shared limited information about both their medical narrative and spiritual journey/beliefs.  Patient processed feeling about current hospitalization.  Patient expressed gratitude for the Chaplain's visit.    Assessment:  Patient does not have any religious/cultural needs that will affect patient???s preferences in health care.  Patient did not indicate any spiritual or religious issues which require Spiritual Care Services interventions at this time.       Plan:  Chaplains will continue to follow and will provide pastoral care on an as needed/requested basis.  Chaplain recommends bedside caregivers page chaplain on duty if patient shows signs of acute spiritual or emotional distress.    [address]ne [place], [place], [place]

## 2016-05-02 NOTE — ED Notes (Signed)
TRANSFER - OUT REPORT:    Verbal report given to Sabino GasserNicole rn  (name) on Denise [name]  being transferred to med  (unit) for routine progression of care       Report consisted of patient???s Situation, Background, Assessment and   Recommendations(SBAR).     Information from the following report(s) SBAR was reviewed with the receiving nurse.    Lines:   Peripheral IV 05/02/16 Left Antecubital (Active)   Site Assessment Clean, dry, & intact 05/02/2016  7:54 AM   Phlebitis Assessment 0 05/02/2016  7:54 AM   Infiltration Assessment 0 05/02/2016  7:54 AM   Dressing Status Clean, dry, & intact 05/02/2016  7:54 AM   Dressing Type Transparent 05/02/2016  7:54 AM   Hub Color/Line Status Pink 05/02/2016  7:54 AM        Opportunity for questions and clarification was provided.      Patient transported with:   [employer]

## 2016-05-02 NOTE — ED Triage Notes (Signed)
States has had cough and lethargy with decreased appetite and no sense of smell or taste since stopping smoking 1.5 weeks ago. Currently [facility] Bronchitis. Intermittent nausea  Sepsis Screening completed    (  )Patient meets SIRS criteria.  (xx  )Patient does not meet SIRS criteria.      SIRS Criteria is achieved when two or more of the following are present  ? Temperature < 96.8??F (36??C) or > 100.9??F (38.3??C)  ? Heart Rate > 90 beats per minute  ? Respiratory Rate > 20 breaths per minute  ? WBC count > 12,000 or <4,000 or > 10% bands

## 2016-05-02 NOTE — H&P (Signed)
History & Physical    Patient: Denise [name] MRN: [id number]  CSN: [id number]    Date of Birth: September 23, 1951  Age: 65 y.o.  Sex: female      DOA: 05/02/2016  Primary Care Provider:  Hyman Bower, MD      Assessment/Plan     Patient Active Problem List   Diagnosis Code   ??? Lung abscess without pneumonia ([place]) J85.2   ??? COPD (chronic obstructive pulmonary disease) (HCC) J44.9   ??? Weight loss R63.4   ??? Celiac disease K90.0     Admit to [facility]  Lung abscess IV Zosyn and Vanco, with her weight loss, need to r/o the malignancy, work up ordered. Consult to Pulmonary and ID. F/U CBC, CRP, ESR  Hx of coccidiomycosis in 1983; check Cocci Ig G and Ig M  Tremor: Continue inderal  Celiac disease: gluten free diet  Weight loss: need to r/o malignancy  COPD: Duo neb prn  Long hx of smoking: just quit smoking 2 wks ago.  DVT/GI prophylaxis  Supportive care  Full code      CC:2 wks hx of worsening cough and chills.        HPI:     Denise [name] is a 65 y.o. female with 67yrsmoking hx, hx of coccidiomycosis lung infection in 1983 s/p drainage in [place], tremor and ocular flashing for which on inderal who came with 2 wks hx of worsening cough and chills. She states that she just quit smoking 2 wks ago when she started productive cough, no fevers, +chills, + fatigue with anorexia and weight loss. No hx of hemoptysis. Denies night sweats. Denies diarrhea. [facility] and was treated with doxy with no improvement. She brought herself to ER today, CT of the chest revealed with possible lung abscess with large area of opacity with varing density, IV Abx started, she is to admitted for further evaluation.     Past Medical History:   Diagnosis Date   ??? Gastrointestinal disorder    ??? GERD (gastroesophageal reflux disease)    ??? Hypertension        Past Surgical History:   Procedure Laterality Date   ??? HX GYN         History reviewed. No pertinent family history.    Social History     Social History   ??? Marital status: SINGLE      Spouse name: N/A   ??? Number of children: N/A   ??? Years of education: N/A     Social History Main Topics   ??? Smoking status: Former Smoker     Quit date: 04/19/2016   ??? Smokeless tobacco: None   ??? Alcohol use None   ??? Drug use: None   ??? Sexual activity: Not Asked     Other Topics Concern   ??? None     Social History Narrative       Prior to Admission medications    Medication Sig Start Date End Date Taking? Authorizing Provider   propranolol (INDERAL) 10 mg tablet Take 10 mg by mouth three (3) times daily.   Yes Phys Other, MD   naproxen sodium (ALEVE) 220 mg cap Take  by mouth.   Yes Phys Other, MD   pseudoephedrine (SUDAFED) 30 mg tablet Take  by mouth every four (4) hours as needed.   Yes Phys Other, MD   omeprazole (PRILOSEC) 20 mg capsule Take 1 Cap by mouth two (2) times a  day. 03/27/12  Yes Jackelyn Hoehn, MD       Allergies   Allergen Reactions   ??? Phenergan [Promethazine] Anaphylaxis   ??? Gluten Other (comments)   ??? Voltaren [Diclofenac Sodium] Other (comments)     syncope       Review of Systems  Gen: No fever. +chills, +malaise,+ weight loss.   Heent: No headache, rhinorrhea, epistaxis, ear pain, hearing loss, sinus pain, neck pain/stiffness, sore throat.   Heart: No chest pain, palpitations, DOE, pnd, or orthopnea.   Resp: + cough, no hemoptysis, no wheezing and shortness of breath.   GI: No nausea, vomiting, diarrhea, constipation, melena or hematochezia.   GU: No urinary obstruction, dysuria or hematuria.   Derm: No rash, new skin lesion or pruritis.   Musc/skeletal: no bone or joint complains.  Vasc: No edema, cyanosis or claudication.   Endo: No heat/cold intolerance, no polyuria,polydipsia or polyphagia.   Neuro: No unilateral weakness, numbness, tingling. No seizures.   Heme: No easy bruising or bleeding.      Physical Exam:     Physical Exam:  Visit Vitals   ??? BP 177/67   ??? Pulse 97   ??? Temp 99 ??F (37.2 ??C)   ??? Resp 18   ??? Ht 5' 4.5" (1.638 m)   ??? Wt 61.2 kg (135 lb)   ??? SpO2 97%    ??? BMI 22.81 kg/m2      O2 Device: Room air    Temp (24hrs), Avg:98.9 ??F (37.2 ??C), Min:98.2 ??F (36.8 ??C), Max:99.3 ??F (37.4 ??C)             General:  Awake, cooperative, no distress.   Head:  Normocephalic, without obvious abnormality, atraumatic.   Eyes:  Conjunctivae/corneas clear, sclera anicteric, PERRL, EOMs intact.   Nose: Nares normal. No drainage or sinus tenderness.   Throat: Lips, mucosa, and tongue normal.    Neck: Supple, symmetrical, trachea midline, no adenopathy.       Lungs:   Clear to auscultation bilaterally.       Heart:  Regular rate and rhythm, S1, S2 normal, no murmur, click, rub or gallop.     Abdomen: Soft, non-tender. Bowel sounds normal. No masses,  No organomegaly.   Extremities: Extremities normal, atraumatic, no cyanosis or edema. Capillary refill normal.   Pulses: 2+ and symmetric all extremities.   Skin: Skin color pink, turgor normal. No rashes or lesions   Neurologic: CNII-XII intact. No focal motor or sensory deficit.       Labs Reviewed:    Recent Results (from the past 24 hour(s))   EKG, 12 LEAD, INITIAL    Collection Time: 05/02/16  7:37 AM   Result Value Ref Range    Ventricular Rate 83 BPM    Atrial Rate 83 BPM    P-R Interval 162 ms    QRS Duration 90 ms    Q-T Interval 378 ms    QTC Calculation (Bezet) 444 ms    Calculated P Axis 61 degrees    Calculated R Axis -21 degrees    Calculated T Axis 64 degrees    Diagnosis       Normal sinus rhythm  Normal ECG  When compared with ECG of 27-Mar-2012 18:56,  Criteria for Anteroseptal infarct are no longer present  Confirmed by Purnell Shoemaker, MD. [phone]) on 05/02/2016 1:45:16 PM     CBC WITH AUTOMATED DIFF    Collection Time: 05/02/16  7:50 AM   Result Value Ref Range  WBC 14.4 (H) 4.6 - 13.2 K/uL    RBC 4.03 (L) 4.20 - 5.30 M/uL    HGB 12.8 12.0 - 16.0 g/dL    HCT 37.2 35.0 - 45.0 %    MCV 92.3 74.0 - 97.0 FL    MCH 31.8 24.0 - 34.0 PG    MCHC 34.4 31.0 - 37.0 g/dL    RDW 12.9 11.6 - 14.5 %    PLATELET 327 135 - 420 K/uL     MPV 10.1 9.2 - 11.8 FL    NEUTROPHILS 78 (H) 40 - 73 %    LYMPHOCYTES 12 (L) 21 - 52 %    MONOCYTES 9 3 - 10 %    EOSINOPHILS 1 0 - 5 %    BASOPHILS 0 0 - 2 %    ABS. NEUTROPHILS 11.3 (H) 1.8 - 8.0 K/UL    ABS. LYMPHOCYTES 1.8 0.9 - 3.6 K/UL    ABS. MONOCYTES 1.2 0.05 - 1.2 K/UL    ABS. EOSINOPHILS 0.1 0.0 - 0.4 K/UL    ABS. BASOPHILS 0.1 (H) 0.0 - 0.06 K/UL    DF AUTOMATED     LIPASE    Collection Time: 05/02/16  8:45 AM   Result Value Ref Range    Lipase 80 73 - 393 U/L   CARDIAC PANEL,(CK, CKMB & TROPONIN)    Collection Time: 05/02/16  8:45 AM   Result Value Ref Range    CK 87 26 - 192 U/L    CK - MB <1.0 <3.6 ng/ml    CK-MB Index Cannot be calulated 0.0 - 4.0 %    Troponin-I, Qt. <0.02 0.00 - 0.06 NG/ML   MAGNESIUM    Collection Time: 05/02/16  8:45 AM   Result Value Ref Range    Magnesium 1.8 1.6 - 2.6 mg/dL   METABOLIC PANEL, COMPREHENSIVE    Collection Time: 05/02/16  8:45 AM   Result Value Ref Range    Sodium 135 (L) 136 - 145 mmol/L    Potassium 3.9 3.5 - 5.5 mmol/L    Chloride 98 (L) 100 - 108 mmol/L    CO2 28 21 - 32 mmol/L    Anion gap 9 3.0 - 18 mmol/L    Glucose 104 (H) 74 - 99 mg/dL    BUN 11 7.0 - 18 MG/DL    Creatinine 0.64 0.6 - 1.3 MG/DL    BUN/Creatinine ratio 17 12 - 20      GFR est AA >60 >60 ml/min/1.33m    GFR est non-AA >60 >60 ml/min/1.779m   Calcium 8.7 8.5 - 10.1 MG/DL    Bilirubin, total 0.6 0.2 - 1.0 MG/DL    ALT (SGPT) 19 13 - 56 U/L    AST (SGOT) 19 15 - 37 U/L    Alk. phosphatase 93 45 - 117 U/L    Protein, total 7.4 6.4 - 8.2 g/dL    Albumin 2.4 (L) 3.4 - 5.0 g/dL    Globulin 5.0 (H) 2.0 - 4.0 g/dL    A-G Ratio 0.5 (L) 0.8 - 1.7     URINALYSIS W/ RFLX MICROSCOPIC    Collection Time: 05/02/16  9:00 AM   Result Value Ref Range    Color YELLOW      Appearance CLEAR      Specific gravity 1.013 1.003 - 1.030      pH (UA) 5.5 5.0 - 8.0      Protein NEGATIVE  NEG mg/dL    Glucose NEGATIVE  NEG  mg/dL    Ketone 15 (A) NEG mg/dL    Bilirubin NEGATIVE  NEG      Blood NEGATIVE  NEG       Urobilinogen 1.0 0.2 - 1.0 EU/dL    Nitrites NEGATIVE  NEG      Leukocyte Esterase NEGATIVE  NEG         Procedures/imaging: see electronic medical records for all procedures/Xrays and details which were not copied into this note but were reviewed prior to creation of Plan      CC: Hyman Bower, MD

## 2016-05-02 NOTE — Progress Notes (Signed)
Vancomycin - Pharmacy to Dose  Consult provided for this 65 y.o. year old ,female for indication of Pneumonia, Lung Abscess.  Therapy day 1        Wt Readings from Last 1 Encounters:   05/02/16 61.2 kg (135 lb)       Ht Readings from Last 1 Encounters:   05/02/16 163.8 cm (64.5")     Dosing Weight:   61.2 kg    Date  05/02/16    Lab Results   Component Value Date/Time    Creatinine 0.64 05/02/2016 08:45 AM     creatinine clearance  77.3 ml/min    white blood cell count    14.4    Will continue Vancomycin at 1000 mg IV q12hrs ( 12hrs after Vanc 1250 mg IV given )    Trough Level after 4-5 doses given.     Dose calculated to approximate a therapeutic trough of 15-5520mcg/mL.      Pharmacy to follow daily and will make changes to dose and/or frequency based on clinical status.      Pharmacist Leim FabryMark Sandberg, [place]    [phone]

## 2016-05-02 NOTE — Consults (Signed)
[facility]  Pulmonary, Critical Care, and Sleep Medicine    Name: Denise [name] MRN: [id number]   DOB: March 31, 1951 Hospital: [facility]   Date: 05/02/2016        Pulmonary  Consult    Requesting MD:                                                  Reason for CC Consult:  IMPRESSION:   ?? Lung abscess , possibly malignancy given risk factors.  ?? Nicotine dependence, quit 2 weeks ago  ?? GERD      RECOMMENDATIONS:   ?? Respiratory culture  ?? Broaden abx coverage with zosyn and vanc  ?? ID consult  ?? Check for cocci IgG and IgM  ?? May need bronchoscopy  ?? Full code     Subjective/History:     This patient has been seen and evaluated at the request of Dr.Hutchison for lung lesion.  Patient is a 65 y.o. female with 60yrsmoking hx. 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 Also reports anorexia and weight loss.  No hx of hemoptysis. Denies night sweats.  No fever.  Denies diarrhea    I have reviewed the CT of the chest, it suggest of large area of opacity with varing density  Suggestive of infectious process.         Past Medical History:   Diagnosis Date   ??? Gastrointestinal disorder    ??? GERD (gastroesophageal reflux disease)    ??? Hypertension       Past Surgical History:   Procedure Laterality Date   ??? HX GYN        Prior to Admission medications    Medication Sig Start Date End Date Taking? Authorizing Provider   propranolol (INDERAL) 10 mg tablet Take 10 mg by mouth three (3) times daily.   Yes Phys Other, MD   naproxen sodium (ALEVE) 220 mg cap Take  by mouth.   Yes Phys Other, MD   pseudoephedrine (SUDAFED) 30 mg tablet Take  by mouth every four (4) hours as needed.   Yes Phys Other, MD   omeprazole (PRILOSEC) 20 mg capsule Take 1 Cap by mouth two (2) times a day. 03/27/12  Yes TJackelyn Hoehn MD     Current Facility-Administered Medications   Medication Dose Route Frequency    ??? piperacillin-tazobactam (ZOSYN) 4.5 g in 0.9% sodium chloride (MBP/ADV) 100 mL MBP  4.5 g IntraVENous NOW   ??? 0.9% sodium chloride infusion  150 mL/hr IntraVENous CONTINUOUS     Allergies   Allergen Reactions   ??? Phenergan [Promethazine] Anaphylaxis   ??? Gluten Other (comments)   ??? Voltaren [Diclofenac Sodium] Other (comments)     syncope      Social History   Substance Use Topics   ??? Smoking status: Former Smoker     Quit date: 04/19/2016   ??? Smokeless tobacco: Not on file   ??? Alcohol use Not on file      History reviewed. No pertinent family history.     Review of Systems:  A comprehensive review of systems was negative except for that written in the HPI.    Objective:   Vital Signs:    Visit Vitals   ??? BP  179/71   ??? Pulse 79   ??? Temp 98.2 ??F (36.8 ??C)   ??? Resp 20   ??? Ht 5' 4.5" (1.638 m)   ??? Wt 61.2 kg (135 lb)   ??? SpO2 98%   ??? BMI 22.81 kg/m2       O2 Device: Room air       Temp (24hrs), Avg:98.2 ??F (36.8 ??C), Min:98.2 ??F (36.8 ??C), Max:98.2 ??F (36.8 ??C)       Intake/Output:   Last shift:         Last 3 shifts:    No intake or output data in the 24 hours ending 05/02/16 1251  Hemodynamics:   .@MAP      .@CVP        Ventilator Settings:  Mode Rate Tidal Volume Pressure FiO2 PEEP                    Peak airway pressure:      Minute ventilation:        ARDS network Guidelines: Lung protective strategy and Pl pressure goals____________    Physical Exam:    General:  Alert, cooperative, no distress, appears stated age.   Head:  Normocephalic, without obvious abnormality, atraumatic.   Eyes:  Conjunctivae/corneas clear. PERRL, EOMs intact.   Nose: Nares normal. Septum midline. Mucosa normal. No drainage or sinus tenderness.   Throat: Lips, mucosa, and tongue normal. Teeth and gums normal.   Neck: Supple, symmetrical, trachea midline, no adenopathy, thyroid: no enlargment/tenderness/nodules, no carotid bruit and no JVD.   Back:   Symmetric, no curvature. ROM normal.   Lungs:   Clear to auscultation bilaterally.    Chest wall:  No tenderness or deformity.   Heart:  Regular rate and rhythm, S1, S2 normal, no murmur, click, rub or gallop.   Abdomen:   Soft, non-tender. Bowel sounds normal. No masses,  No organomegaly.   Extremities: Extremities normal, atraumatic, no cyanosis or edema.   Pulses: 2+ and symmetric all extremities.   Skin: Skin color, texture, turgor normal. No rashes or lesions   Lymph nodes: Cervical, supraclavicular, and axillary nodes normal.   Neurologic: Grossly nonfocal       Data:     Recent Results (from the past 24 hour(s))   EKG, 12 LEAD, INITIAL    Collection Time: 05/02/16  7:37 AM   Result Value Ref Range    Ventricular Rate 83 BPM    Atrial Rate 83 BPM    P-R Interval 162 ms    QRS Duration 90 ms    Q-T Interval 378 ms    QTC Calculation (Bezet) 444 ms    Calculated P Axis 61 degrees    Calculated R Axis -21 degrees    Calculated T Axis 64 degrees    Diagnosis       Normal sinus rhythm  Normal ECG  When compared with ECG of 27-Mar-2012 18:56,  Criteria for Anteroseptal infarct are no longer present     CBC WITH AUTOMATED DIFF    Collection Time: 05/02/16  7:50 AM   Result Value Ref Range    WBC 14.4 (H) 4.6 - 13.2 K/uL    RBC 4.03 (L) 4.20 - 5.30 M/uL    HGB 12.8 12.0 - 16.0 g/dL    HCT 37.2 35.0 - 45.0 %    MCV 92.3 74.0 - 97.0 FL    MCH 31.8 24.0 - 34.0 PG    MCHC 34.4 31.0 - 37.0 g/dL  RDW 12.9 11.6 - 14.5 %    PLATELET 327 135 - 420 K/uL    MPV 10.1 9.2 - 11.8 FL    NEUTROPHILS 78 (H) 40 - 73 %    LYMPHOCYTES 12 (L) 21 - 52 %    MONOCYTES 9 3 - 10 %    EOSINOPHILS 1 0 - 5 %    BASOPHILS 0 0 - 2 %    ABS. NEUTROPHILS 11.3 (H) 1.8 - 8.0 K/UL    ABS. LYMPHOCYTES 1.8 0.9 - 3.6 K/UL    ABS. MONOCYTES 1.2 0.05 - 1.2 K/UL    ABS. EOSINOPHILS 0.1 0.0 - 0.4 K/UL    ABS. BASOPHILS 0.1 (H) 0.0 - 0.06 K/UL    DF AUTOMATED     LIPASE    Collection Time: 05/02/16  8:45 AM   Result Value Ref Range    Lipase 80 73 - 393 U/L   CARDIAC PANEL,(CK, CKMB & TROPONIN)    Collection Time: 05/02/16  8:45 AM    Result Value Ref Range    CK 87 26 - 192 U/L    CK - MB <1.0 <3.6 ng/ml    CK-MB Index Cannot be calulated 0.0 - 4.0 %    Troponin-I, Qt. <0.02 0.00 - 0.06 NG/ML   MAGNESIUM    Collection Time: 05/02/16  8:45 AM   Result Value Ref Range    Magnesium 1.8 1.6 - 2.6 mg/dL   METABOLIC PANEL, COMPREHENSIVE    Collection Time: 05/02/16  8:45 AM   Result Value Ref Range    Sodium 135 (L) 136 - 145 mmol/L    Potassium 3.9 3.5 - 5.5 mmol/L    Chloride 98 (L) 100 - 108 mmol/L    CO2 28 21 - 32 mmol/L    Anion gap 9 3.0 - 18 mmol/L    Glucose 104 (H) 74 - 99 mg/dL    BUN 11 7.0 - 18 MG/DL    Creatinine 0.64 0.6 - 1.3 MG/DL    BUN/Creatinine ratio 17 12 - 20      GFR est AA >60 >60 ml/min/1.66m    GFR est non-AA >60 >60 ml/min/1.736m   Calcium 8.7 8.5 - 10.1 MG/DL    Bilirubin, total 0.6 0.2 - 1.0 MG/DL    ALT (SGPT) 19 13 - 56 U/L    AST (SGOT) 19 15 - 37 U/L    Alk. phosphatase 93 45 - 117 U/L    Protein, total 7.4 6.4 - 8.2 g/dL    Albumin 2.4 (L) 3.4 - 5.0 g/dL    Globulin 5.0 (H) 2.0 - 4.0 g/dL    A-G Ratio 0.5 (L) 0.8 - 1.7     URINALYSIS W/ RFLX MICROSCOPIC    Collection Time: 05/02/16  9:00 AM   Result Value Ref Range    Color YELLOW      Appearance CLEAR      Specific gravity 1.013 1.003 - 1.030      pH (UA) 5.5 5.0 - 8.0      Protein NEGATIVE  NEG mg/dL    Glucose NEGATIVE  NEG mg/dL    Ketone 15 (A) NEG mg/dL    Bilirubin NEGATIVE  NEG      Blood NEGATIVE  NEG      Urobilinogen 1.0 0.2 - 1.0 EU/dL    Nitrites NEGATIVE  NEG      Leukocyte Esterase NEGATIVE  NEG  Telemetry:normal sinus rhythm    Imaging:  I have personally reviewed the patient???s radiographs and have reviewed the reports:     High: Phenergan [Promethazine]    ?? Unspecified: Gluten;  Voltaren [Diclofenac Sodium]   ??   Result Information    ?? Status Provider Status      ?? Final result (Exam End: 05/02/2016 10:37 AM) Open    ??   Study Result    ?? EXAM: CT Chest   ??  INDICATION: Cough, elevated white blood cell count..  ??   COMPARISON: Chest radiograph 05/02/2016.Marland Kitchen  ??  TECHNIQUE: Axial CT imaging from the thoracic inlet through the diaphragm after  intravenous contrast. Multiplanar reformations were generated. One or more dose  reduction techniques were used on this CT: automated exposure control,  adjustment of the mAs and/or kVp according to patient's size, and iterative  reconstruction techniques. The specific techniques utilized on this CT exam have  been documented in the patient's electronic medical record.  ??  _______________  ??  FINDINGS:  ??  LUNGS: In keeping with radiographic findings, there is a large area of diffusely  abnormal opacity present within the superior segment of the right lower lobe,  with internal features of hypoattenuation and small soft tissue gas formation  (series 3, image 98). There is adjacent groundglass opacity, which is mild in  nature. This process respects the adjacent fissural surfaces. Overall  measurements are somewhat difficult given the oblong nature of this opacity,  with gross measurements of approximately 8.8 x 6.2 x 9.4 cm in size.  ??  Linear areas of atelectatic opacity are present at the dependent portions of  each lung base, as well as in the right upper and right middle lobes. No  additional site of focal pneumonic consolidation or suspicious pulmonary  nodule/mass.  ??  PLEURA: No pneumothorax or pleural effusion.  ??  AIRWAY: Central airways are patent.  ??  MEDIASTINUM: Diffuse atherosclerotic calcification of the thoracic aorta is  present without evidence of aneurysmal dilatation. Cardiac size is normal. There  is no pericardial effusion. Small amount of fluid present within the superior  pericardial recess.  ??  LYMPH NODES: No supra clavicular or axillary adenopathy. Increased number of  mediastinal lymph nodes are noted, with enlarged subcarinal lymph node measuring  approximately 1.4 x 2.2 cm in size.  ??  UPPER ABDOMEN: Unremarkable.  ??   OSSEOUS STRUCTURES: Unremarkable. No suspicious or blastic osseous lesions.  ??  OTHER:  ??  _______________  ??  IMPRESSION  IMPRESSION:  ??  ??  1. Abnormal radiographic opacity corresponds to a large area of abnormal  pulmonary parenchymal opacity with focal areas of hypoattenuation soft tissue  gas present within the superior segment of the right lower lobe, without  crossing the adjacent right major fissure. Overall imaging appearance is most  suggestive of a abscess forming pulmonary infectious process, with underlying  malignancy difficult to exclude on the basis of current imaging. No associated  pleural effusion is present. The adjacent subpleural fat, and lateral right ribs  are normal in appearance. Recommend cross-sectional imaging follow-up to  document expected resolution following treatment.  ??  2. Increased number of mediastinal lymph nodes, with likely reactive subcarinal  adenopathy.   ??              Total critical care time exclusive of procedures: 35 minutes  Melissa Montane, MD  05/02/2016, 12:51 PM

## 2016-05-02 NOTE — Other (Signed)
TRANSFER - IN REPORT:    Verbal report received from Paul HalfM. Miller RN(name) on Denise [name]  being received from ED (unit) for routine progression of care      Report consisted of patient???s Situation, Background, Assessment and   Recommendations(SBAR).     Information from the following report(s) SBAR, Kardex, Intake/Output and MAR was reviewed with the receiving nurse.    Opportunity for questions and clarification was provided.      Assessment completed upon patient???s arrival to unit and care assumed.

## 2016-05-02 NOTE — Other (Signed)
Bedside and Verbal shift change report given to C. Manson PasseyBrown RN (Cabin crewoncoming nurse) by Judie PetitM. Jarold MottoPatterson RN Physiological scientist(offgoing nurse). Report included the following information SBAR, Kardex, Intake/Output and MAR.   Visit Vitals   ??? BP 151/58 (BP 1 Location: Right arm, BP Patient Position: At rest)   ??? Pulse 100   ??? Temp 98.9 ??F (37.2 ??C)   ??? Resp 18   ??? Ht 5' 4.5" (1.638 m)   ??? Wt 61.2 kg (135 lb)   ??? SpO2 96%   ??? BMI 22.81 kg/m2

## 2016-05-02 NOTE — Consults (Signed)
[facility]  CONSULTATIONS    Name:  Avon GullyMAGEE, Margurite  MR#:  [id number]  DOB:  07-12-1951  Account #:  [id number]  Date of Adm:  05/02/2016  Date of Consultation:      REASON FOR CONSULTATION: A 65 year old female referred by Dr.  Dinah BeersGuan for further evaluation and management of lung abscess.    RECOMMENDATIONS:  1. Lung abscess: This patient reports foul tasting and smelling sputum  production along with systemic complaints of sweats, anorexia, and  weight loss. She has longstanding history of tobacco abuse. CT image  with evidence of right lower lobe abscess. Gas is seen.  2. Associated lymphadenopathy. Of concern is malignancy  complicating this syndrome. This patient does not have obvious risk  factors of alcohol abuse or poor dentition to increase her risk of  pyogenic lung abscess alone.    As such, we will continue empiric Zosyn. Will check sputum. Will  pursue PICC line placement as ordinarily this syndrome requires  several weeks of parenteral antibiotics. I suspect a bronchoscopy will  be worthwhile to exclude underlying malignancy at some point.    Thank you for allowing us to participate in the care of your patient. Will  continue to follow with you.    HISTORY OF PRESENT ILLNESS: The patient is a [age] year old  female with longstanding history of tobacco abuse, who states over the  last several weeks to have had progressive anorexia, cough, and foul  tasting sputum. The patient developed some modest abdominal  discomfort related to the repeated cough. [facility]. She has undergone CT  imaging demonstrating evidence of lung abscess. She has been  started on broad-spectrum antimicrobial therapy.    Presently, the patient states she feels no worse. She has some  associated headache. There are no complaints of diplopia, facial pain,  mouth pain, or neck pain. There is no significant hemoptysis. No  palpitations. She complains of modest  abdominal discomfort generally  that has not worsened. There has been no associated nausea,  vomiting, diarrhea, dysuria, urgency, frequency, additional myalgias,  arthralgias, or skin rash. She does complain of weight loss,  though cannot quantify. Full 12-point review of systems was performed  and unless otherwise specifically stated above, was negative.    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 She complains of hypertension. No known history of  diabetes, heart disease, or chronic lung disease. 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  This  appears to have been treated with surgical resection alone.    ALLERGIES: THE PATIENT REPORTS ALLERGY OF AUGMENTIN  CAUSING STOMACH UPSET.    SOCIAL HISTORY: The patient quit smoking 2 weeks ago. Denies  alcohol abuse. Works at [employer]. Denies other unusual environmental  exposures.    REVIEW OF SYSTEMS  As above.    PHYSICAL EXAMINATION  GENERAL: Awake, alert, oriented x3, in no acute distress.  VITAL SIGNS: Temperature 99.0, blood pressure 160/70, heart rate  100.  HEENT: Mild temporal wasting. No conjunctival lesions or icterus. Oral  cavity clear, no thrush.  NECK: Supple. No JVD or adenopathy.  CHEST: Scattered bibasilar crackles, no external wheezes, right  greater than left, with dullness to percussion.  CARDIOVASCULAR: Normal S1, S2. No murmurs.  ABDOMEN: Soft, nontender. No rebound or guarding, no  hepatosplenomegaly, no masses.  GENITOURINARY: No inguinal adenopathy. No lesions.  EXTREMITIES: Trace distal edema. No rash or petechiae.  LABORATORY: Chest CT independently reviewed.    White blood cells 14,000, hemoglobin 12.8, platelet count 327,000.  Sodium 135, potassium 3.9, chloride 98, bicarbonate 28, BUN 11,  creatinine 0.6. Liver enzymes are normal. Blood cultures are sterile to  date.    CT independently reviewed.        Marguerita Merles, MD    DK / MS1  D:  05/02/2016   19:58  T:   05/02/2016   20:20  Job #:  [id number]

## 2016-05-02 NOTE — Consults (Signed)
Consults by Melissa Montane at 05/02/16 1251                Author: Melissa Montane  Service: Pulmonary Disease  Author Type: Physician       Filed: 05/02/16 1258  Date of Service: 05/02/16 1251  Status: Signed          Editor: Melissa Montane            Consult Orders        1. IP CONSULT TO PULMONOLOGY [[id number]] ordered by Jackelyn Hoehn, MD at 05/02/16 [facility]   Pulmonary, Critical Care, and Sleep Medicine           Name:  Denise [name]  MRN:  [id number]          DOB:  January 02, 1951  Hospital:  [facility]     Date:  05/02/2016             Pulmonary  Consult      Requesting MD:                                                  Reason for CC Consult:     IMPRESSION:     ??    Lung abscess , possibly malignancy given risk factors.   ??  Nicotine dependence, quit 2 weeks ago   ??  GERD              RECOMMENDATIONS:     ??    Respiratory culture   ??  Broaden abx coverage with zosyn and vanc   ??  ID consult   ??  Check for cocci IgG and IgM   ??  May need bronchoscopy   ??  Full code          Subjective/History:        This patient has been seen and evaluated at the request of Dr.Hutchison for lung lesion.  Patient is a  65 y.o. female with 35yrsmoking hx. 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 Also reports anorexia and weight loss.  No hx of hemoptysis. Denies night sweats.  No fever.  Denies diarrhea    I have reviewed the CT of the chest, it suggest of large area of opacity  with varing density  Suggestive of infectious process.              Past Medical History:        Diagnosis  Date         ?  Gastrointestinal disorder       ?  GERD (gastroesophageal reflux disease)           ?  Hypertension             Past Surgical History:         Procedure  Laterality  Date          ?  HX GYN               Prior to Admission medications  Medication  Sig  Start  Date  End Date  Taking?  Authorizing Provider            propranolol (INDERAL) 10 mg tablet  Take 10 mg by mouth three (3) times daily.      Yes  Phys Other, MD     naproxen sodium (ALEVE) 220 mg cap  Take  by mouth.      Yes  Phys Other, MD     pseudoephedrine (SUDAFED) 30 mg tablet  Take  by mouth every four (4) hours as needed.      Yes  Phys Other, MD            omeprazole (PRILOSEC) 20 mg capsule  Take 1 Cap by mouth two (2) times a day.  03/27/12    Yes  Jackelyn Hoehn, MD          Current Facility-Administered Medications          Medication  Dose  Route  Frequency           ?  piperacillin-tazobactam (ZOSYN) 4.5 g in 0.9% sodium chloride (MBP/ADV) 100 mL MBP   4.5 g  IntraVENous  NOW           ?  0.9% sodium chloride infusion   150 mL/hr  IntraVENous  CONTINUOUS          Allergies        Allergen  Reactions         ?  Phenergan [Promethazine]  Anaphylaxis     ?  Gluten  Other (comments)     ?  Voltaren [Diclofenac Sodium]  Other (comments)             syncope           Social History       Substance Use Topics         ?  Smoking status:  Former Smoker              Quit date:  04/19/2016         ?  Smokeless tobacco:  Not on file         ?  Alcohol use  Not on file         History reviewed. No pertinent family history.       Review of Systems:   A comprehensive review of systems was negative except for that written in the HPI.        Objective:     Vital Signs:     Visit Vitals      ?  BP  179/71      ?  Pulse  79      ?  Temp  98.2 ??F (36.8 ??C)      ?  Resp  20      ?  Ht  5' 4.5" (1.638 m)      ?  Wt  61.2 kg (135 lb)      ?  SpO2  98%      ?  BMI  22.81 kg/m2                O2 Device: Room air           Temp (24hrs), Avg:98.2 ??F (36.8 ??C), Min:98.2 ??F (36.8 ??C), Max:98.2 ??F (36.8 ??C)           Intake/Output:    Last shift:          Last 3  shifts:     No intake or output data in the 24 hours ending 05/02/16 1251   Hemodynamics:        .'@MAP'          .'@CVP'             Ventilator Settings :          Mode  Rate   Tidal Volume  Pressure  FiO2  PEEP                                 Peak airway pressure:            Minute ventilation:            ARDS network Guidelines: Lung protective strategy and Pl pressure goals____________      Physical Exam:         General:   Alert, cooperative, no distress, appears stated age.        Head:   Normocephalic, without obvious abnormality, atraumatic.     Eyes:   Conjunctivae/corneas clear. PERRL, EOMs intact.        Nose:  Nares normal. Septum midline. Mucosa normal. No drainage or sinus tenderness.        Throat:  Lips, mucosa, and tongue normal. Teeth and gums normal.        Neck:  Supple, symmetrical, trachea midline, no adenopathy, thyroid: no enlargment/tenderness/nodules, no carotid bruit and no JVD.     Back:    Symmetric, no curvature. ROM normal.     Lungs:    Clear to auscultation bilaterally.        Chest wall:   No tenderness or deformity.        Heart:   Regular rate and rhythm, S1, S2 normal, no murmur, click, rub or gallop.     Abdomen:    Soft, non-tender. Bowel sounds normal. No masses,  No organomegaly.     Extremities:  Extremities normal, atraumatic, no cyanosis or edema.     Pulses:  2+ and symmetric all extremities.     Skin:  Skin color, texture, turgor normal. No rashes or lesions     Lymph nodes:  Cervical, supraclavicular, and axillary nodes normal.     Neurologic:  Grossly nonfocal             Data:          Recent Results (from the past 24 hour(s))     EKG, 12 LEAD, INITIAL          Collection Time: 05/02/16  7:37 AM         Result  Value  Ref Range            Ventricular Rate  83  BPM       Atrial Rate  83  BPM       P-R Interval  162  ms       QRS Duration  90  ms       Q-T Interval  378  ms       QTC Calculation (Bezet)  444  ms       Calculated P Axis  61  degrees       Calculated R Axis  -21  degrees       Calculated T Axis  64  degrees       Diagnosis  Normal sinus rhythm   Normal ECG   When compared with ECG of 27-Mar-2012 18:56,   Criteria  for Anteroseptal infarct are no longer present          CBC WITH AUTOMATED DIFF          Collection Time: 05/02/16  7:50 AM         Result  Value  Ref Range            WBC  14.4 (H)  4.6 - 13.2 K/uL       RBC  4.03 (L)  4.20 - 5.30 M/uL       HGB  12.8  12.0 - 16.0 g/dL       HCT  37.2  35.0 - 45.0 %       MCV  92.3  74.0 - 97.0 FL       MCH  31.8  24.0 - 34.0 PG       MCHC  34.4  31.0 - 37.0 g/dL       RDW  12.9  11.6 - 14.5 %       PLATELET  327  135 - 420 K/uL       MPV  10.1  9.2 - 11.8 FL       NEUTROPHILS  78 (H)  40 - 73 %       LYMPHOCYTES  12 (L)  21 - 52 %       MONOCYTES  9  3 - 10 %       EOSINOPHILS  1  0 - 5 %       BASOPHILS  0  0 - 2 %       ABS. NEUTROPHILS  11.3 (H)  1.8 - 8.0 K/UL       ABS. LYMPHOCYTES  1.8  0.9 - 3.6 K/UL       ABS. MONOCYTES  1.2  0.05 - 1.2 K/UL       ABS. EOSINOPHILS  0.1  0.0 - 0.4 K/UL       ABS. BASOPHILS  0.1 (H)  0.0 - 0.06 K/UL       DF  AUTOMATED          LIPASE          Collection Time: 05/02/16  8:45 AM         Result  Value  Ref Range            Lipase  80  73 - 393 U/L       CARDIAC PANEL,(CK, CKMB & TROPONIN)          Collection Time: 05/02/16  8:45 AM         Result  Value  Ref Range            CK  87  26 - 192 U/L       CK - MB  <1.0  <3.6 ng/ml       CK-MB Index  Cannot be calulated  0.0 - 4.0 %       Troponin-I, Qt.  <0.02  0.00 - 0.06 NG/ML       MAGNESIUM          Collection Time: 05/02/16  8:45 AM         Result  Value  Ref Range            Magnesium  1.8  1.6 - 2.6 mg/dL       METABOLIC PANEL,  COMPREHENSIVE          Collection Time: 05/02/16  8:45 AM         Result  Value  Ref Range            Sodium  135 (L)  136 - 145 mmol/L       Potassium  3.9  3.5 - 5.5 mmol/L       Chloride  98 (L)  100 - 108 mmol/L       CO2  28  21 - 32 mmol/L       Anion gap  9  3.0 - 18 mmol/L       Glucose  104 (H)  74 - 99 mg/dL       BUN  11  7.0 - 18 MG/DL       Creatinine  0.64  0.6 - 1.3 MG/DL       BUN/Creatinine ratio  17  12 - 20         GFR est AA  >60  >60  ml/min/1.23m       GFR est non-AA  >60  >60 ml/min/1.72m      Calcium  8.7  8.5 - 10.1 MG/DL       Bilirubin, total  0.6  0.2 - 1.0 MG/DL       ALT (SGPT)  19  13 - 56 U/L       AST (SGOT)  19  15 - 37 U/L       Alk. phosphatase  93  45 - 117 U/L       Protein, total  7.4  6.4 - 8.2 g/dL       Albumin  2.4 (L)  3.4 - 5.0 g/dL       Globulin  5.0 (H)  2.0 - 4.0 g/dL       A-G Ratio  0.5 (L)  0.8 - 1.7         URINALYSIS W/ RFLX MICROSCOPIC          Collection Time: 05/02/16  9:00 AM         Result  Value  Ref Range            Color  YELLOW          Appearance  CLEAR          Specific gravity  1.013  1.003 - 1.030         pH (UA)  5.5  5.0 - 8.0         Protein  NEGATIVE   NEG mg/dL       Glucose  NEGATIVE   NEG mg/dL       Ketone  15 (A)  NEG mg/dL       Bilirubin  NEGATIVE   NEG         Blood  NEGATIVE   NEG         Urobilinogen  1.0  0.2 - 1.0 EU/dL       Nitrites  NEGATIVE   NEG              Leukocyte Esterase  NEGATIVE   NEG                   Telemetry:normal sinus rhythm      Imaging:   I have personally reviewed the patients radiographs and have reviewed the reports:         High: Phenergan [Promethazine]        ??  Unspecified: Gluten;  Voltaren [Diclofenac Sodium]      ??      Result Information       ??  Status  Provider Status          ??  Final result (Exam End: 05/02/2016 10:37 AM)  Open        ??      Study Result       ??  EXAM: CT Chest    ??   INDICATION: Cough, elevated white blood cell count..   ??   COMPARISON: Chest radiograph 05/02/2016.Marland Kitchen   ??   TECHNIQUE: Axial CT imaging from the thoracic inlet through the diaphragm after   intravenous contrast. Multiplanar reformations were generated. One or more dose   reduction techniques were used on this CT: automated exposure control,   adjustment of the mAs and/or kVp according to patient's size, and iterative   reconstruction techniques. The specific techniques utilized on this CT exam have   been documented in the patient's electronic medical record.   ??    _______________   ??   FINDINGS:   ??   LUNGS: In keeping with radiographic findings, there is a large area of diffusely   abnormal opacity present within the superior segment of the right lower lobe,   with internal features of hypoattenuation and small soft tissue gas formation   (series 3, image 98). There is adjacent groundglass opacity, which is mild in   nature. This process respects the adjacent fissural surfaces. Overall   measurements are somewhat difficult given the oblong nature of this opacity,   with gross measurements of approximately 8.8 x 6.2 x 9.4 cm in size.   ??   Linear areas of atelectatic opacity are present at the dependent portions of   each lung base, as well as in the right upper and right middle lobes. No   additional site of focal pneumonic consolidation or suspicious pulmonary   nodule/mass.   ??   PLEURA: No pneumothorax or pleural effusion.   ??   AIRWAY: Central airways are patent.   ??   MEDIASTINUM: Diffuse atherosclerotic calcification of the thoracic aorta is   present without evidence of aneurysmal dilatation. Cardiac size is normal. There   is no pericardial effusion. Small amount of fluid present within the superior   pericardial recess.   ??   LYMPH NODES: No supra clavicular or axillary adenopathy. Increased number of   mediastinal lymph nodes are noted, with enlarged subcarinal lymph node measuring   approximately 1.4 x 2.2 cm in size.   ??   UPPER ABDOMEN: Unremarkable.   ??   OSSEOUS STRUCTURES: Unremarkable. No suspicious or blastic osseous lesions.   ??   OTHER:   ??   _______________   ??   IMPRESSION   IMPRESSION:   ??   ??   1. Abnormal radiographic opacity corresponds to a large area of abnormal   pulmonary parenchymal opacity with focal areas of hypoattenuation soft tissue   gas present within the superior segment of the right lower lobe, without   crossing the adjacent right major fissure. Overall imaging appearance is most   suggestive of a abscess forming pulmonary infectious  process, with underlying   malignancy difficult to exclude on the basis of current imaging. No associated   pleural effusion is present. The adjacent subpleural fat, and lateral right ribs   are normal in appearance. Recommend cross-sectional imaging follow-up to   document expected resolution following  treatment.   ??   2. Increased number of mediastinal lymph nodes, with likely reactive subcarinal   adenopathy.      ??                             Total critical care time exclusive of procedures : 35 minutes   Melissa Montane, MD   05/02/2016, 12:51 PM

## 2016-05-02 NOTE — Consults (Signed)
ID Consult  Dictated #[id number]    Thanks.  Brandon Melnickaniel Jules Baty, MD

## 2016-05-03 ENCOUNTER — Inpatient Hospital Stay: Payer: MEDICARE | Primary: Family Medicine

## 2016-05-03 LAB — CBC WITH AUTOMATED DIFF
ABS. BASOPHILS: 0 10*3/uL (ref 0.0–0.06)
ABS. EOSINOPHILS: 0.1 10*3/uL (ref 0.0–0.4)
ABS. LYMPHOCYTES: 1.7 10*3/uL (ref 0.9–3.6)
ABS. MONOCYTES: 1.1 10*3/uL (ref 0.05–1.2)
ABS. NEUTROPHILS: 10.1 10*3/uL — ABNORMAL HIGH (ref 1.8–8.0)
BASOPHILS: 0 % (ref 0–2)
EOSINOPHILS: 1 % (ref 0–5)
HCT: 33.2 % — ABNORMAL LOW (ref 35.0–45.0)
HGB: 11.4 g/dL — ABNORMAL LOW (ref 12.0–16.0)
LYMPHOCYTES: 13 % — ABNORMAL LOW (ref 21–52)
MCH: 31.7 PG (ref 24.0–34.0)
MCHC: 34.3 g/dL (ref 31.0–37.0)
MCV: 92.2 FL (ref 74.0–97.0)
MONOCYTES: 9 % (ref 3–10)
MPV: 10 FL (ref 9.2–11.8)
NEUTROPHILS: 77 % — ABNORMAL HIGH (ref 40–73)
PLATELET: 281 10*3/uL (ref 135–420)
RBC: 3.6 M/uL — ABNORMAL LOW (ref 4.20–5.30)
RDW: 13 % (ref 11.6–14.5)
WBC: 13 10*3/uL (ref 4.6–13.2)

## 2016-05-03 LAB — METABOLIC PANEL, BASIC
Anion gap: 11 mmol/L (ref 3.0–18)
BUN/Creatinine ratio: 12 (ref 12–20)
BUN: 9 MG/DL (ref 7.0–18)
CO2: 25 mmol/L (ref 21–32)
Calcium: 7.8 MG/DL — ABNORMAL LOW (ref 8.5–10.1)
Chloride: 101 mmol/L (ref 100–108)
Creatinine: 0.78 MG/DL (ref 0.6–1.3)
GFR est AA: >60 mL/min/{1.73_m2} (ref >60)
GFR est non-AA: >60 mL/min/{1.73_m2} (ref >60)
Glucose: 111 mg/dL — ABNORMAL HIGH (ref 74–99)
Potassium: 3.6 mmol/L (ref 3.5–5.5)
Sodium: 137 mmol/L (ref 136–145)

## 2016-05-03 LAB — SED RATE (ESR): Sed rate, automated: 54 mm/hr — ABNORMAL HIGH (ref 0–30)

## 2016-05-03 LAB — PROTHROMBIN TIME + INR
INR: 1.4 — ABNORMAL HIGH (ref 0.8–1.2)
Prothrombin time: 16.3 s — ABNORMAL HIGH (ref 11.5–15.2)

## 2016-05-03 MED ORDER — ACETAMINOPHEN 325 MG TABLET
325 mg | Freq: Four times a day (QID) | ORAL | Status: DC | PRN
Start: 2016-05-03 — End: 2016-05-07
  Administered 2016-05-03: 05:00:00 via ORAL

## 2016-05-03 MED FILL — ZOSYN 4.5 GRAM INTRAVENOUS SOLUTION: 4.5 gram | INTRAVENOUS | Qty: 4.5

## 2016-05-03 MED FILL — PROPRANOLOL 20 MG TAB: 20 mg | ORAL | Qty: 1

## 2016-05-03 MED FILL — ACETAMINOPHEN 325 MG TABLET: 325 mg | ORAL | Qty: 2

## 2016-05-03 MED FILL — HEPARIN (PORCINE) 5,000 UNIT/ML IJ SOLN: 5000 unit/mL | INTRAMUSCULAR | Qty: 1

## 2016-05-03 NOTE — Other (Signed)
Bedside and verbal shift change report given to T. Phipps RN (oncoming nurse) by C. Brown RN (offgoing nurse). Report included the following information SBAR, Kardex and MAR.

## 2016-05-03 NOTE — Progress Notes (Signed)
[facility]  Pulmonary, Critical Care, and Sleep Medicine    Name: Denise [name] MRN: [id number]   DOB: Nov 11, 1951 Hospital: [facility]   Date: 05/03/2016            Requesting MD:                                                  Reason for CC Consult:  IMPRESSION:   ?? Lung abscess , possibly malignancy given risk factors.  ?? Nicotine dependence, quit 2 weeks ago  ?? GERD      RECOMMENDATIONS:   ?? Respiratory culture  ?? Broaden abx coverage with zosyn and vanc  ?? ID consult  ?? Check for cocci IgG and IgM  ?? May need bronchoscopy, will wait for culture findings.  ?? Full code     Subjective/History:     This patient has been seen and evaluated at the request of Dr.Hutchison for lung lesion.  Patient is a 65 y.o. female with 23yrsmoking hx. 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 Also reports anorexia and weight loss.  No hx of hemoptysis. Denies night sweats.  No fever.  Denies diarrhea    I have reviewed the CT of the chest, it suggest of large area of opacity with varing density  Suggestive of infectious process.       5/30  No issues overnight  Feeling better since abx.  Coughing less  Reviewed the ID input.    Waiting for resp culture result,    Past Medical History:   Diagnosis Date   ??? Gastrointestinal disorder    ??? GERD (gastroesophageal reflux disease)    ??? Hypertension       Past Surgical History:   Procedure Laterality Date   ??? HX GYN        Prior to Admission medications    Medication Sig Start Date End Date Taking? Authorizing Provider   propranolol (INDERAL) 10 mg tablet Take 10 mg by mouth three (3) times daily.   Yes Phys Other, MD   naproxen sodium (ALEVE) 220 mg cap Take  by mouth.   Yes Phys Other, MD   pseudoephedrine (SUDAFED) 30 mg tablet Take  by mouth every four (4) hours as needed.   Yes Phys Other, MD   omeprazole (PRILOSEC) 20 mg capsule Take 1 Cap by mouth two (2) times a  day. 03/27/12  Yes TJackelyn Hoehn MD     Current Facility-Administered Medications   Medication Dose Route Frequency   ??? heparin (porcine) injection 5,000 Units  5,000 Units SubCUTAneous Q8H   ??? piperacillin-tazobactam (ZOSYN) 4.5 g in 0.9% sodium chloride (MBP/ADV) 100 mL MBP  4.5 g IntraVENous Q6H   ??? propranolol (INDERAL) tablet 10 mg  10 mg Oral TID   ??? 0.9% sodium chloride infusion  100 mL/hr IntraVENous CONTINUOUS     Allergies   Allergen Reactions   ??? Phenergan [Promethazine] Anaphylaxis   ??? Gluten Other (comments)   ??? Voltaren [Diclofenac Sodium] Other (comments)     syncope      Social History   Substance Use Topics   ??? Smoking status: Former Smoker     Quit date: 04/19/2016   ??? Smokeless tobacco: Not on file   ???  Alcohol use Not on file      History reviewed. No pertinent family history.     Review of Systems:  A comprehensive review of systems was negative except for that written in the HPI.    Objective:   Vital Signs:    Visit Vitals   ??? BP 132/49   ??? Pulse 68   ??? Temp 98.1 ??F (36.7 ??C)   ??? Resp 17   ??? Ht 5' 4.5" (1.638 m)   ??? Wt 61.5 kg (135 lb 8 oz)   ??? SpO2 100%   ??? BMI 22.9 kg/m2       O2 Device: Room air       Temp (24hrs), Avg:99 ??F (37.2 ??C), Min:98.1 ??F (36.7 ??C), Max:102.2 ??F (39 ??C)       Intake/Output:   Last shift:      05/30 0701 - 05/30 1900  In: 100 [P.O.:100]  Out: -   Last 3 shifts: 05/28 1901 - 05/30 0700  In: 2424 [P.O.:400; I.V.:2024]  Out: 0     Intake/Output Summary (Last 24 hours) at 05/03/16 1306  Last data filed at 05/03/16 1024   Gross per 24 hour   Intake             2524 ml   Output                0 ml   Net             2524 ml     Hemodynamics:   .@MAP      .@CVP        Ventilator Settings:  Mode Rate Tidal Volume Pressure FiO2 PEEP                    Peak airway pressure:      Minute ventilation:        ARDS network Guidelines: Lung protective strategy and Pl pressure goals____________    Physical Exam:    General:  Alert, cooperative, no distress, appears stated age.    Head:  Normocephalic, without obvious abnormality, atraumatic.   Eyes:  Conjunctivae/corneas clear. PERRL, EOMs intact.   Nose: Nares normal. Septum midline. Mucosa normal. No drainage or sinus tenderness.   Throat: Lips, mucosa, and tongue normal. Teeth and gums normal.   Neck: Supple, symmetrical, trachea midline, no adenopathy, thyroid: no enlargment/tenderness/nodules, no carotid bruit and no JVD.   Back:   Symmetric, no curvature. ROM normal.   Lungs:   Clear to auscultation bilaterally.   Chest wall:  No tenderness or deformity.   Heart:  Regular rate and rhythm, S1, S2 normal, no murmur, click, rub or gallop.   Abdomen:   Soft, non-tender. Bowel sounds normal. No masses,  No organomegaly.   Extremities: Extremities normal, atraumatic, no cyanosis or edema.   Pulses: 2+ and symmetric all extremities.   Skin: Skin color, texture, turgor normal. No rashes or lesions   Lymph nodes: Cervical, supraclavicular, and axillary nodes normal.   Neurologic: Grossly nonfocal       Data:     Recent Results (from the past 24 hour(s))   METABOLIC PANEL, BASIC    Collection Time: 05/03/16  1:50 AM   Result Value Ref Range    Sodium 137 136 - 145 mmol/L    Potassium 3.6 3.5 - 5.5 mmol/L    Chloride 101 100 - 108 mmol/L    CO2 25 21 - 32 mmol/L    Anion gap 11 3.0 -  18 mmol/L    Glucose 111 (H) 74 - 99 mg/dL    BUN 9 7.0 - 18 MG/DL    Creatinine 0.78 0.6 - 1.3 MG/DL    BUN/Creatinine ratio 12 12 - 20      GFR est AA >60 >60 ml/min/1.57m    GFR est non-AA >60 >60 ml/min/1.733m   Calcium 7.8 (L) 8.5 - 10.1 MG/DL   CBC WITH AUTOMATED DIFF    Collection Time: 05/03/16  1:50 AM   Result Value Ref Range    WBC 13.0 4.6 - 13.2 K/uL    RBC 3.60 (L) 4.20 - 5.30 M/uL    HGB 11.4 (L) 12.0 - 16.0 g/dL    HCT 33.2 (L) 35.0 - 45.0 %    MCV 92.2 74.0 - 97.0 FL    MCH 31.7 24.0 - 34.0 PG    MCHC 34.3 31.0 - 37.0 g/dL    RDW 13.0 11.6 - 14.5 %    PLATELET 281 135 - 420 K/uL    MPV 10.0 9.2 - 11.8 FL    NEUTROPHILS 77 (H) 40 - 73 %     LYMPHOCYTES 13 (L) 21 - 52 %    MONOCYTES 9 3 - 10 %    EOSINOPHILS 1 0 - 5 %    BASOPHILS 0 0 - 2 %    ABS. NEUTROPHILS 10.1 (H) 1.8 - 8.0 K/UL    ABS. LYMPHOCYTES 1.7 0.9 - 3.6 K/UL    ABS. MONOCYTES 1.1 0.05 - 1.2 K/UL    ABS. EOSINOPHILS 0.1 0.0 - 0.4 K/UL    ABS. BASOPHILS 0.0 0.0 - 0.06 K/UL    DF AUTOMATED     SED RATE (ESR)    Collection Time: 05/03/16  1:50 AM   Result Value Ref Range    Sed rate, automated 54 (H) 0 - 30 mm/hr   PROTHROMBIN TIME + INR    Collection Time: 05/03/16  8:18 AM   Result Value Ref Range    Prothrombin time 16.3 (H) 11.5 - 15.2 sec    INR 1.4 (H) 0.8 - 1.2               Telemetry:normal sinus rhythm    Imaging:  I have personally reviewed the patient???s radiographs and have reviewed the reports:     High: Phenergan [Promethazine]    ?? Unspecified: Gluten;  Voltaren [Diclofenac Sodium]   ??   Result Information    ?? Status Provider Status      ?? Final result (Exam End: 05/02/2016 10:37 AM) Open    ??   Study Result    ?? EXAM: CT Chest   ??  INDICATION: Cough, elevated white blood cell count..  ??  COMPARISON: Chest radiograph 05/02/2016.. Marland Kitchen??  TECHNIQUE: Axial CT imaging from the thoracic inlet through the diaphragm after  intravenous contrast. Multiplanar reformations were generated. One or more dose  reduction techniques were used on this CT: automated exposure control,  adjustment of the mAs and/or kVp according to patient's size, and iterative  reconstruction techniques. The specific techniques utilized on this CT exam have  been documented in the patient's electronic medical record.  ??  _______________  ??  FINDINGS:  ??  LUNGS: In keeping with radiographic findings, there is a large area of diffusely  abnormal opacity present within the superior segment of the right lower lobe,  with internal features of hypoattenuation and small soft tissue gas formation  (series 3, image 98). There  is adjacent groundglass opacity, which is mild in   nature. This process respects the adjacent fissural surfaces. Overall  measurements are somewhat difficult given the oblong nature of this opacity,  with gross measurements of approximately 8.8 x 6.2 x 9.4 cm in size.  ??  Linear areas of atelectatic opacity are present at the dependent portions of  each lung base, as well as in the right upper and right middle lobes. No  additional site of focal pneumonic consolidation or suspicious pulmonary  nodule/mass.  ??  PLEURA: No pneumothorax or pleural effusion.  ??  AIRWAY: Central airways are patent.  ??  MEDIASTINUM: Diffuse atherosclerotic calcification of the thoracic aorta is  present without evidence of aneurysmal dilatation. Cardiac size is normal. There  is no pericardial effusion. Small amount of fluid present within the superior  pericardial recess.  ??  LYMPH NODES: No supra clavicular or axillary adenopathy. Increased number of  mediastinal lymph nodes are noted, with enlarged subcarinal lymph node measuring  approximately 1.4 x 2.2 cm in size.  ??  UPPER ABDOMEN: Unremarkable.  ??  OSSEOUS STRUCTURES: Unremarkable. No suspicious or blastic osseous lesions.  ??  OTHER:  ??  _______________  ??  IMPRESSION  IMPRESSION:  ??  ??  1. Abnormal radiographic opacity corresponds to a large area of abnormal  pulmonary parenchymal opacity with focal areas of hypoattenuation soft tissue  gas present within the superior segment of the right lower lobe, without  crossing the adjacent right major fissure. Overall imaging appearance is most  suggestive of a abscess forming pulmonary infectious process, with underlying  malignancy difficult to exclude on the basis of current imaging. No associated  pleural effusion is present. The adjacent subpleural fat, and lateral right ribs  are normal in appearance. Recommend cross-sectional imaging follow-up to  document expected resolution following treatment.  ??  2. Increased number of mediastinal lymph nodes, with likely reactive subcarinal   adenopathy.   ??              Total critical care time exclusive of procedures:    Melissa Montane, MD  05/03/2016, 12:51 PM

## 2016-05-03 NOTE — Progress Notes (Signed)
Chaplain conducted a Follow up consultation and Spiritual Assessment for Verl DickerJanet Jo Schoen, who is a 65 y.o.,female.      Continued the relationship of care and support.   Listened empathically.  Offered prayer and assurance of continued prayer on patients behalf.     Plan:  Chaplains will continue to follow and will provide pastoral care as needed or requested.  Chaplain recommends bedside caregivers page the chaplain on duty if patient shows signs of acute spiritual or emotional distress.     Father Darliss RidgelAlex Shuter, M.Div.  Chaplain/Priest  [phone] - Office

## 2016-05-03 NOTE — Other (Signed)
Bedside shift change report given to stephanie M., RN (oncoming nurse) by Ranjana M.,RN (offgoing nurse). Report included the following information SBAR, Kardex and MAR.

## 2016-05-03 NOTE — Progress Notes (Addendum)
2250 - Pt resting in bed at this time. A&Ox4, RA. Denies chest pain. SOB with coughing. 22G to right FA intact and patent. Pain rated 0/10. No concerns voiced. Bed in lowest position, call bell within reach.

## 2016-05-03 NOTE — Other (Signed)
EMR entered and reviewed by Professional Development Specialist for the purpose of chart review in the course of performing educational functions and responsibilities related to performance improvement.

## 2016-05-03 NOTE — Other (Addendum)
[id number] Assumed care of pt at this time. Pt in bed with no signs of distress. Pt left with call light within reach and encouraged to call for assistance.    1024 Head to toe assessment completed at this time  Patient is A&OX4. Pt denies N/V chest pain and SOB or distress. Pt is calm and cooperative.Pedal pulses are present. Capillary refill less than 3 seconds. Skin in warm and dry. Lungs are clear bilaterally. Bowel sounds are active. Abdomen is soft and non-tender.   No tingling or numbness to lower extremities. 22 g needle in the RFA. Pain scale explained to patient. Reasons for taking PRN meds explained to patient.  Patient instructed to call for prn when needed.  Pain level is 0.   Patient was oriented to call bell and bed function. Will continue to monitor      1755 sputum culture send to lab at this time.      Shift summary: Patient had uneventful shift.patient has poor appetite so encouraged to eat.PICC line not placed today , may be placed tomorrow . No issues/concerns at this time

## 2016-05-03 NOTE — Progress Notes (Signed)
Infectious Disease Progress Note    Antibiotics:  zosyn    CC:  Lung abscess    Subj:  65 yo long time smoker admitted with progressive coughing with foul tasting sputum, sweats, anorexia.  Pt reports some weight loss over the past 3-5 years - but relates this more to diagnosis of celiac sprue than unintentional weight loss.  No weight loss in the past year.    Pt sitting up in bed and reports continued cough with productive sputum.  She c/o some chest discomfort.  Poor appetite persists.  Denies rash    No n/v   No diarrhea.  No dysuria or frequency.      Physical Exam:  Vital signs reviewed  Tmax 102.2  General:  NAD  Non toxic.  No resp distress  HEENT:  PERRL, OP clear, sclera anicteric  Neck:  Supple  CHest: CTA B.  No rhonchi or wheeze noted.  Slightly decreased at R base  CV:  RRR no murmur noted  Abd:  Soft NT  ND  Ext:  No edema  Skin:  No rash    Labs, cultures and radiology reviewed  Wbc 13.0  plt 281   crt 0.78    5/29 blood cx x 1:  NGTD  5/29  Sputum:  >25 wbc, <10 Epi  Few G+C pairs/chains Rare GNR    Assessment/ Plan:  -  RLL lung abscess with reactive subcarinal adnopathy            Unable to exclude underlying malignancy in this long time cigarette user            No hx of ETOH abuse or poor dentition to increase risk of abscess      ==>  Will continue current abx therapy :  Zosyn and vancomycin pending culture results                   Pt will require prolonged course of IV abx with serial radiographic monitoring    ==>  Consider bronchoscopy to evaluate for malignancy    -  Nicotine dependence - long term            Quit 2 weeks prior to admission            Have counseled to avoid further use    -  ? 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

## 2016-05-03 NOTE — Progress Notes (Signed)
Hospitalist Progress Note-critical care note     Patient: Denise DickerJanet Jo [name] MRN: [id number]  CSN: [id number]    Date of Birth: 1951/07/11  Age: 65 y.o.  Sex: female    DOA: 05/02/2016 LOS:  LOS: 1 day            Chief complaint:    Assessment/Plan         Patient Active Problem List   Diagnosis Code   ??? Lung abscess without pneumonia (HCC) J85.2   ??? COPD (chronic obstructive pulmonary disease) (HCC) J44.9   ??? Weight loss R63.4   ??? Celiac disease K90.0     1. Lung abscess vs malignant   - afebrile overnight  -ID and  Pulm on board  -will continue IV Zosyn and Vanco,   -Hx of coccidiomycosis in 1983; check Cocci Ig G and Ig M  -possible bronch      2. Tremor: Continue inderal  3. Celiac disease  -gluten free diet    4. Weight loss, no appetite ,  Adenopathy on ct chest  Need to r/o malignancy     5. COPD with long hx of smoking: just quit smoking 2 wks ago   stable  Duo neb prn  .  Subjective: the food taste bad, no appetite ,chills-but room T was low.     Will decrease iv hydration   Review of systems:    General: No fevers . + chills ,tired   Cardiovascular: No chest pain or pressure. No palpitations.   Pulmonary: No shortness of breath.   Gastrointestinal: No nausea, vomiting.     Vital signs/Intake and Output:  Visit Vitals   ??? BP 132/49   ??? Pulse 68   ??? Temp 98.1 ??F (36.7 ??C)   ??? Resp 17   ??? Ht 5' 4.5" (1.638 m)   ??? Wt 61.5 kg (135 lb 8 oz)   ??? SpO2 100%   ??? BMI 22.9 kg/m2     Current Shift:  05/30 0701 - 05/30 1900  In: 100 [P.O.:100]  Out: -   Last three shifts:  05/28 1901 - 05/30 0700  In: 2424 [P.O.:400; I.V.:2024]  Out: 0     Physical Exam:  General: WD, WN.  Alert, cooperative, no acute distress????  HEENT: NC, Atraumatic.  PERRLA, anicteric sclerae.  Lungs: Mild crackles of Rt side, no wheezing   Heart:  Regular  rhythm,?? +murmur, No Rubs, No Gallops  Abdomen: Soft, Non distended, Non tender. ??+Bowel sounds,   Extremities: No c/c/e  Psych:???? Not anxious or agitated.   Neurologic:?? No acute neurological deficit.             Labs: Results:       Chemistry Recent Labs      05/03/16   0150  05/02/16   0845   GLU  111*  104*   NA  137  135*   K  3.6  3.9   CL  101  98*   CO2  25  28   BUN  9  11   CREA  0.78  0.64   CA  7.8*  8.7   AGAP  11  9   BUCR  12  17   AP   --   93   TP   --   7.4   ALB   --   2.4*   GLOB   --   5.0*   AGRAT   --   0.5*  CBC w/Diff Recent Labs      05/03/16   0150  05/02/16   0750   WBC  13.0  14.4*   RBC  3.60*  4.03*   HGB  11.4*  12.8   HCT  33.2*  37.2   PLT  281  327   GRANS  77*  78*   LYMPH  13*  12*   EOS  1  1      Cardiac Enzymes Recent Labs      05/02/16   0845   CPK  87   CKND1  Cannot be calulated      Coagulation Recent Labs      05/03/16   0818   PTP  16.3*   INR  1.4*       Lipid Panel No results found for: CHOL, CHOLPOCT, CHOLX, CHLST, CHOLV, [id number], HDL, LDL, NLDLCT, DLDL, LDLC, DLDLP, [id number], VLDLC, VLDL, TGL, TGLX, TRIGL, [id number], TRIGP, TGLPOCT, [id number], CHHD, CHHDX   BNP No results for input(s): BNPP in the last 72 hours.   Liver Enzymes Recent Labs      05/02/16   0845   TP  7.4   ALB  2.4*   AP  93   SGOT  19      Thyroid Studies No results found for: T4, T3U, TSH, TSHEXT     Procedures/imaging: see electronic medical records for all procedures/Xrays and details which were not copied into this note but were reviewed prior to creation of Plan        Wendi Snipes, MD

## 2016-05-03 NOTE — Progress Notes (Signed)
Pt admitted due to lung abscess with out pneumonia. Pt is independent and lives alone.  Pt is uninsured and has been screened by [employer].  [employer] has assisted pt with applying for Medicare and has provided pt with a finical application.  Spoke with ID and have discussed options for [facility].  Awaiting cultures to assist with determining ABX course, duration and frequency.  If pt will require IV ABX, please consider a daily dose if possible to assist with transition of care.  Pt has indicated she has transportation to [facility].  CM to continue to follow and assist with plan of care needs.      Readmission Risk Assessment:     Moderate Risk and MSSP/Good Help [facility] patients    RRAT Score:  13-20    Initial Assessment: [facility]      Emergency Contact:  Tommye StandardMaryellen Haywood [phone]    Pertinent Medical Hx:    See clinical      PCP/Specialists:  Lorinda Creedharles Seager      Community Services:   None at this time    DME:      None    Moderate Risk Care Transition Plan:  1. [facility], [facility], acute rehab, community care coordination of resources.  2. Involve patient/caregiver in assessment, planning, education and implement of intervention.  3. CM daily patient care huddles/interdisciplinary rounds.  4. PCP/Specialist appointment within 5 ??? 7 days made prior to discharge.  5. Facilitate transportation and logistics for follow-up appointments.  6. Medication reconciliation ??? Pharmacy  7. Formal handoff between hospital provider and post-acute provider to transition patient  [facility]off to [facility] Nurse Navigator or PCP practice.      Care Management Interventions  PCP Verified by CM: Yes  Mode of Transport at Discharge: Other (see comment) (Friend)  Transition of Care Consult (CM Consult): Discharge Planning, Infusion Center  Health Maintenance Reviewed: Yes  Current Support Network: Family Lives Nearby  Confirm Follow Up Transport: Friends

## 2016-05-04 ENCOUNTER — Inpatient Hospital Stay: Admit: 2016-05-04 | Payer: MEDICARE | Primary: Family Medicine

## 2016-05-04 LAB — CBC WITH AUTOMATED DIFF
ABS. BASOPHILS: 0 10*3/uL (ref 0.0–0.06)
ABS. EOSINOPHILS: 0.2 10*3/uL (ref 0.0–0.4)
ABS. LYMPHOCYTES: 1.7 10*3/uL (ref 0.9–3.6)
ABS. MONOCYTES: 1 10*3/uL (ref 0.05–1.2)
ABS. NEUTROPHILS: 7.9 10*3/uL (ref 1.8–8.0)
BASOPHILS: 0 % (ref 0–2)
EOSINOPHILS: 2 % (ref 0–5)
HCT: 31.6 % — ABNORMAL LOW (ref 35.0–45.0)
HGB: 10.9 g/dL — ABNORMAL LOW (ref 12.0–16.0)
LYMPHOCYTES: 16 % — ABNORMAL LOW (ref 21–52)
MCH: 31.9 PG (ref 24.0–34.0)
MCHC: 34.5 g/dL (ref 31.0–37.0)
MCV: 92.4 FL (ref 74.0–97.0)
MONOCYTES: 9 % (ref 3–10)
MPV: 10 FL (ref 9.2–11.8)
NEUTROPHILS: 73 % (ref 40–73)
PLATELET: 269 10*3/uL (ref 135–420)
RBC: 3.42 M/uL — ABNORMAL LOW (ref 4.20–5.30)
RDW: 13.3 % (ref 11.6–14.5)
WBC: 10.9 10*3/uL (ref 4.6–13.2)

## 2016-05-04 LAB — CRP, HIGH SENSITIVITY: C-Reactive Protein, Cardiac: 202.25 mg/L — ABNORMAL HIGH (ref 0.00–3.00)

## 2016-05-04 LAB — METABOLIC PANEL, BASIC
Anion gap: 9 mmol/L (ref 3.0–18)
BUN/Creatinine ratio: 6 — ABNORMAL LOW (ref 12–20)
BUN: 4 MG/DL — ABNORMAL LOW (ref 7.0–18)
CO2: 24 mmol/L (ref 21–32)
Calcium: 7.7 MG/DL — ABNORMAL LOW (ref 8.5–10.1)
Chloride: 105 mmol/L (ref 100–108)
Creatinine: 0.62 MG/DL (ref 0.6–1.3)
GFR est AA: >60 mL/min/{1.73_m2} (ref >60)
GFR est non-AA: >60 mL/min/{1.73_m2} (ref >60)
Glucose: 105 mg/dL — ABNORMAL HIGH (ref 74–99)
Potassium: 3.3 mmol/L — ABNORMAL LOW (ref 3.5–5.5)
Sodium: 138 mmol/L (ref 136–145)

## 2016-05-04 MED ORDER — HEPARIN LOCK FLUSH 100 UNIT/ML IV SOLN
100 unit/mL | Freq: Once | INTRAVENOUS | Status: AC
Start: 2016-05-04 — End: 2016-05-04
  Administered 2016-05-04: 15:00:00

## 2016-05-04 MED ORDER — HEPARIN (PORCINE) IN NS (PF) 1,000 UNIT/500 ML IV
1000 unit/500 mL | Freq: Once | INTRAVENOUS | Status: AC
Start: 2016-05-04 — End: 2016-05-07
  Administered 2016-05-04: 15:00:00

## 2016-05-04 MED ORDER — POTASSIUM CHLORIDE SR 20 MEQ TAB, PARTICLES/CRYSTALS
20 mEq | ORAL | Status: AC
Start: 2016-05-04 — End: 2016-05-04
  Administered 2016-05-04: 19:00:00 via ORAL

## 2016-05-04 MED ORDER — LIDOCAINE HCL 1 % (10 MG/ML) IJ SOLN
10 mg/mL (1 %) | Freq: Once | INTRAMUSCULAR | Status: AC
Start: 2016-05-04 — End: 2016-05-04
  Administered 2016-05-04: 15:00:00 via SUBCUTANEOUS

## 2016-05-04 MED FILL — POTASSIUM CHLORIDE SR 20 MEQ TAB, PARTICLES/CRYSTALS: 20 mEq | ORAL | Qty: 1

## 2016-05-04 MED FILL — HEPARIN (PORCINE) 5,000 UNIT/ML IJ SOLN: 5000 unit/mL | INTRAMUSCULAR | Qty: 1

## 2016-05-04 MED FILL — ZOSYN 4.5 GRAM INTRAVENOUS SOLUTION: 4.5 gram | INTRAVENOUS | Qty: 4.5

## 2016-05-04 MED FILL — PROPRANOLOL 20 MG TAB: 20 mg | ORAL | Qty: 1

## 2016-05-04 MED FILL — HEPARIN LOCK FLUSH 100 UNIT/ML IV SOLN: 100 unit/mL | INTRAVENOUS | Qty: 5

## 2016-05-04 NOTE — Progress Notes (Signed)
Infectious Disease Progress Note  ??  Antibiotics:  zosyn  ??  CC: Lung abscess  ??  Subj: [name] is a 65 yo long time smoker admitted with progressive coughing with foul tasting sputum, sweats, anorexia. Pt reports some weight loss over the past 3-5 years - but relates this more to diagnosis of celiac sprue than unintentional weight loss. No weight loss in the past year.  ??  Pt sitting up in bed and reports continued cough with productive sputum. She denies any fevers or chills. Denies any n/v/d. States that her taste is off - she eats a muffing but it tastes like an egg. She states that she is going to have a bronchoscopy tomorrow.  No dysuria or frequency.   ??  Physical Exam:  Vital signs reviewed  Tmax 100  General: NAD Non toxic. No resp distress  HEENT: PERRL, OP clear, sclera anicteric  Neck: Supple  CHest: CTA B. No rhonchi or wheeze noted. Slightly decreased at R base  CV: RRR no murmur noted  Abd: Soft NT ND  Ext: No edema  Skin: No rash  ??  Labs, cultures and radiology reviewed  Wbc 10.9, Hb=10.9, Plt=269  Cr=0.62  ??  5/29 blood cx x 1: NGTD  5/29 Sputum: >25 wbc, <10 Epi Few G+C pairs/chains Rare GNR - normal flora  5/30: sputum: normal flora  ??  Assessment/ Plan:  - RLL lung abscess with reactive subcarinal adnopathy  Unable to exclude underlying malignancy in this long time cigarette user  No hx of ETOH abuse or poor dentition to increase risk of abscess  ==> Will continue zosyn pending culture results  Pt will require prolonged course of IV abx with serial radiographic monitoring  ==> Agree with bronchoscopy to evaluate for malignancy  ??  - Nicotine dependence - long term  Quit 2 weeks prior to admission  Have counseled to avoid further use  ??  - ? 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  - Celiac Sprue diagnosed recently   Weight loss has stabilized with appropriate therapy.   ??      ?? ??   ??

## 2016-05-04 NOTE — Other (Signed)
335fr dl Bard PICC ready for use.

## 2016-05-04 NOTE — Progress Notes (Signed)
Received report from Delon SacramentoS. Morens RN.     [phone] patient assessment was completed at this time.  Patient states that she is not having any pain at this time.  Propranlol was held due to B/P 99/54.    1600 contacted ENDO patient going for Bronchoscopy tomorrow morning at 0930.      Shift summary- rest of shift was uneventful.

## 2016-05-04 NOTE — Progress Notes (Signed)
Hospitalist Progress Note-critical care note     Patient: Denise [name] MRN: [id number]  CSN: [id number]    Date of Birth: 07-11-1951  Age: 65 y.o.  Sex: female    DOA: 05/02/2016 LOS:  LOS: 2 days            Chief complaint: lung abscess, hypokalemia     Assessment/Plan         Patient Active Problem List   Diagnosis Code   ??? Lung abscess without pneumonia (HCC) J85.2   ??? COPD (chronic obstructive pulmonary disease) (HCC) J44.9   ??? Weight loss R63.4   ??? Celiac disease K90.0     1. Lung abscess vs malignant   - afebrile overnight, feel better today   -ID and  Pulm on board  -will continue IV Zosyn and Vanc, till respiratory  cx few rod and cocci  -Hx of coccidiomycosis in 1983;   -hiv negative,   -picc line placed today   -pulm on board, need more work up for possible malignancy     2. Tremor   Continue inderal    3. Celiac disease  -gluten free diet    4. Weight loss  appetite better today   Adenopathy on ct chest  Need to r/o malignancy     5. COPD with long hx of smoking:   -quitted smoking 2 wks ago   stable  Duo neb prn    6 hypokalemia   K replacement . Will check mg      Subjective: feel better     Will stop iv today.   She reported that she has pcm, but not like him, looking for new physician.    Review of systems:    General: No fevers . No chills , less fatigue   Cardiovascular: No chest pain or pressure. No palpitations.   Pulmonary: No shortness of breath.   Gastrointestinal: No nausea, vomiting.     Vital signs/Intake and Output:  Visit Vitals   ??? BP 99/54 (BP 1 Location: Left arm, BP Patient Position: At rest)   ??? Pulse 73   ??? Temp 98.6 ??F (37 ??C)   ??? Resp 17   ??? Ht 5' 4.5" (1.638 m)   ??? Wt 61.5 kg (135 lb 8 oz)   ??? SpO2 96%   ??? BMI 22.9 kg/m2     Current Shift:     Last three shifts:  05/29 1901 - 05/31 0700  In: 4349 [P.O.:600; I.V.:3749]  Out: 0     Physical Exam:  General: WD, WN.  Alert, cooperative, no acute distress????  HEENT: NC, Atraumatic.  PERRLA, anicteric sclerae.   Lungs: cta bilateral , no wheezing   Heart:  Regular  rhythm,?? +murmur, No Rubs, No Gallops  Abdomen: Soft, Non distended, Non tender. ??+Bowel sounds,   Extremities: No c/c/e  Psych:???? Not anxious or agitated.  Neurologic:?? No acute neurological deficit.             Labs: Results:       Chemistry Recent Labs      05/04/16   0546  05/03/16   0150  05/02/16   0845   GLU  105*  111*  104*   NA  138  137  135*   K  3.3*  3.6  3.9   CL  105  101  98*   CO2  BUN  4*  9  11   CREA  0.62  0.78  0.64   CA  7.7*  7.8*  8.7   AGAP  9  11  9    BUCR  6*  12  17   AP   --    --   93   TP   --    --   7.4   ALB   --    --   2.4*   GLOB   --    --   5.0*   AGRAT   --    --   0.5*      CBC w/Diff Recent Labs      05/04/16   0546  05/03/16   0150  05/02/16   0750   WBC  10.9  13.0  14.4*   RBC  3.42*  3.60*  4.03*   HGB  10.9*  11.4*  12.8   HCT  31.6*  33.2*  37.2   PLT  269  281  327   GRANS  73  77*  78*   LYMPH  16*  13*  12*   EOS  2  1  1       Cardiac Enzymes Recent Labs      05/02/16   0845   CPK  87   CKND1  Cannot be calulated      Coagulation Recent Labs      05/03/16   0818   PTP  16.3*   INR  1.4*       Lipid Panel No results found for: CHOL, CHOLPOCT, CHOLX, CHLST, CHOLV, [id number], HDL, LDL, NLDLCT, DLDL, LDLC, DLDLP, [id number], VLDLC, VLDL, TGL, TGLX, TRIGL, [id number], TRIGP, TGLPOCT, [id number], CHHD, CHHDX   BNP No results for input(s): BNPP in the last 72 hours.   Liver Enzymes Recent Labs      05/02/16   0845   TP  7.4   ALB  2.4*   AP  93   SGOT  19      Thyroid Studies No results found for: T4, T3U, TSH, TSHEXT, TSHEXT     Procedures/imaging: see electronic medical records for all procedures/Xrays and details which were not copied into this note but were reviewed prior to creation of Plan        Wendi SnipesXIA Danelle Curiale, MD

## 2016-05-04 NOTE — Progress Notes (Signed)
Patient was visited by Eucharist Minister Volunteer. Volunteer followed up with patient and/or family and reported no needs to this chaplain.  Chaplains will continue to follow and will provide pastoral care as needed or requested.    Chaplain Sister Levie Owensby [facility]  Chaplain  [phone] - Office

## 2016-05-04 NOTE — Other (Signed)
Bedside and Verbal shift change report given to A. Reinaldo RaddlePryor RN (Cabin crewoncoming nurse) by Judie PetitM. Jarold MottoPatterson RN Physiological scientist(offgoing nurse). Report included the following information SBAR, Kardex, Intake/Output and Accordion.   Visit Vitals   ??? BP 164/73 (BP 1 Location: Left arm, BP Patient Position: At rest)   ??? Pulse 91   ??? Temp 98.3 ??F (36.8 ??C)   ??? Resp 20   ??? Ht 5' 4.5" (1.638 m)   ??? Wt 61.5 kg (135 lb 8 oz)   ??? SpO2 96%   ??? BMI 22.9 kg/m2

## 2016-05-04 NOTE — Procedures (Signed)
Vascular & Interventional Radiology Brief Procedure Note    Interventional Radiologist: Tenna Childavid F Mayla Biddy, MD    Pre-operative Diagnosis:  IV ABX     Post-operative Diagnosis: Same as pre-op dx    Procedure(s) Performed:  Ultrasound/fluoroscopic guided PICC line placement    Anesthesia:  Local      Findings:  Successful right arm dual lumen 43F PICC line placement. Ready for immediate use.      Complications: None    Estimated Blood Loss:  minimal    Tubes and Drains: as above    Specimens: None    Condition: Stable       Tenna Childavid F Darriona Dehaas, MD  [facility]  Vascular & Interventional Radiology  05/04/2016

## 2016-05-04 NOTE — Progress Notes (Signed)
[facility]  Pulmonary, Critical Care, and Sleep Medicine    Name: Denise DickerJanet Jo [name] MRN: [id number]   DOB: August 01, 1951 [facility]   Date: 05/04/2016            Requesting MD:                                                  Reason for CC Consult:  IMPRESSION:   ?? Lung abscess , possibly malignancy given risk factors.  ?? Nicotine dependence, quit 2 weeks ago  ?? GERD      RECOMMENDATIONS:   ?? Respiratory culture  ?? Broaden abx coverage with zosyn and vanc  ?? Will attempt to schedule bronchoscopy tomorrow or Friday.  ?? Check for cocci IgG and IgM  ?? May need bronchoscopy, will wait for culture findings.  ?? Full code     Subjective/History:     This patient has been seen and evaluated at the request of Dr.Hutchison for lung lesion.  Patient is a 65 y.o. female with 6859yr smoking hx. 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 Also reports anorexia and weight loss.  No hx of hemoptysis. Denies night sweats.  No fever.  Denies diarrhea    I have reviewed the CT of the chest, it suggest of large area of opacity with varing density  Suggestive of infectious process.       5/31  Continue to cough less.  Wbc down  Tolerating po.   Discussed about need for bronchoscopy.      Past Medical History:   Diagnosis Date   ??? Gastrointestinal disorder    ??? GERD (gastroesophageal reflux disease)    ??? Hypertension       Past Surgical History:   Procedure Laterality Date   ??? HX GYN        Prior to Admission medications    Medication Sig Start Date End Date Taking? Authorizing Provider   propranolol (INDERAL) 10 mg tablet Take 10 mg by mouth three (3) times daily.   Yes Phys Other, MD   naproxen sodium (ALEVE) 220 mg cap Take  by mouth.   Yes Phys Other, MD   pseudoephedrine (SUDAFED) 30 mg tablet Take  by mouth every four (4) hours as needed.   Yes Phys Other, MD    omeprazole (PRILOSEC) 20 mg capsule Take 1 Cap by mouth two (2) times a day. 03/27/12  Yes Hetty Blendimothy Hutchison, MD     Current Facility-Administered Medications   Medication Dose Route Frequency   ??? potassium chloride (K-DUR, KLOR-CON) SR tablet 20 mEq  20 mEq Oral NOW   ??? heparin (porcine) injection 5,000 Units  5,000 Units SubCUTAneous Q8H   ??? piperacillin-tazobactam (ZOSYN) 4.5 g in 0.9% sodium chloride (MBP/ADV) 100 mL MBP  4.5 g IntraVENous Q6H   ??? propranolol (INDERAL) tablet 10 mg  10 mg Oral TID   ??? 0.9% sodium chloride infusion  75 mL/hr IntraVENous CONTINUOUS     Allergies   Allergen Reactions   ??? Phenergan [Promethazine] Anaphylaxis   ??? Gluten Other (comments)   ??? Voltaren [Diclofenac Sodium] Other (comments)     syncope      Social History   Substance Use Topics   ??? Smoking status: Former Smoker  Quit date: 04/19/2016   ??? Smokeless tobacco: Not on file   ??? Alcohol use Not on file      History reviewed. No pertinent family history.     Review of Systems:  A comprehensive review of systems was negative except for that written in the HPI.    Objective:   Vital Signs:    Visit Vitals   ??? BP 99/54 (BP 1 Location: Left arm, BP Patient Position: At rest)   ??? Pulse 73   ??? Temp 98.6 ??F (37 ??C)   ??? Resp 17   ??? Ht 5' 4.5" (1.638 m)   ??? Wt 61.5 kg (135 lb 8 oz)   ??? SpO2 96%   ??? BMI 22.9 kg/m2       O2 Device: Room air       Temp (24hrs), Avg:98.7 ??F (37.1 ??C), Min:97.9 ??F (36.6 ??C), Max:100 ??F (37.8 ??C)       Intake/Output:   Last shift:         Last 3 shifts: 05/29 1901 - 05/31 0700  In: 4349 [P.O.:600; I.V.:3749]  Out: 0     Intake/Output Summary (Last 24 hours) at 05/04/16 1412  Last data filed at 05/03/16 1946   Gross per 24 hour   Intake             1825 ml   Output                0 ml   Net             1825 ml     Hemodynamics:   .      .        Ventilator Settings:  Mode Rate Tidal Volume Pressure FiO2 PEEP                    Peak airway pressure:      Minute ventilation:         ARDS network Guidelines: Lung protective strategy and Pl pressure goals____________    Physical Exam:    General:  Alert, cooperative, no distress, appears stated age.   Head:  Normocephalic, without obvious abnormality, atraumatic.   Eyes:  Conjunctivae/corneas clear. PERRL, EOMs intact.   Nose: Nares normal. Septum midline. Mucosa normal. No drainage or sinus tenderness.   Throat: Lips, mucosa, and tongue normal. Teeth and gums normal.   Neck: Supple, symmetrical, trachea midline, no adenopathy, thyroid: no enlargment/tenderness/nodules, no carotid bruit and no JVD.   Back:   Symmetric, no curvature. ROM normal.   Lungs:   Clear to auscultation bilaterally.   Chest wall:  No tenderness or deformity.   Heart:  Regular rate and rhythm, S1, S2 normal, no murmur, click, rub or gallop.   Abdomen:   Soft, non-tender. Bowel sounds normal. No masses,  No organomegaly.   Extremities: Extremities normal, atraumatic, no cyanosis or edema.   Pulses: 2+ and symmetric all extremities.   Skin: Skin color, texture, turgor normal. No rashes or lesions   Lymph nodes: Cervical, supraclavicular, and axillary nodes normal.   Neurologic: Grossly nonfocal       Data:     Recent Results (from the past 24 hour(s))   CULTURE, RESPIRATORY/SPUTUM/BRONCH W GRAM STAIN    Collection Time: 05/03/16  5:55 PM   Result Value Ref Range    Special Requests: NO SPECIAL REQUESTS      GRAM STAIN 10-25 WBC/lpf      GRAM STAIN <10 EPI/lpf  GRAM STAIN FEW  GRAM POSITIVE COCCI  IN PAIRS        GRAM STAIN FEW  GRAM POSITIVE RODS        GRAM STAIN FEW  GRAM NEGATIVE RODS        GRAM STAIN MUCUS PRESENT      Culture result: FEW  NORMAL RESPIRATORY FLORA       METABOLIC PANEL, BASIC    Collection Time: 05/04/16  5:46 AM   Result Value Ref Range    Sodium 138 136 - 145 mmol/L    Potassium 3.3 (L) 3.5 - 5.5 mmol/L    Chloride 105 100 - 108 mmol/L    CO2 24 21 - 32 mmol/L    Anion gap 9 3.0 - 18 mmol/L    Glucose 105 (H) 74 - 99 mg/dL     BUN 4 (L) 7.0 - 18 MG/DL    Creatinine 0.98 0.6 - 1.3 MG/DL    BUN/Creatinine ratio 6 (L) 12 - 20      GFR est AA >60 >60 ml/min/1.97m2    GFR est non-AA >60 >60 ml/min/1.51m2    Calcium 7.7 (L) 8.5 - 10.1 MG/DL   CBC WITH AUTOMATED DIFF    Collection Time: 05/04/16  5:46 AM   Result Value Ref Range    WBC 10.9 4.6 - 13.2 K/uL    RBC 3.42 (L) 4.20 - 5.30 M/uL    HGB 10.9 (L) 12.0 - 16.0 g/dL    HCT 11.9 (L) 14.7 - 45.0 %    MCV 92.4 74.0 - 97.0 FL    MCH 31.9 24.0 - 34.0 PG    MCHC 34.5 31.0 - 37.0 g/dL    RDW 82.9 56.2 - 13.0 %    PLATELET 269 135 - 420 K/uL    MPV 10.0 9.2 - 11.8 FL    NEUTROPHILS 73 40 - 73 %    LYMPHOCYTES 16 (L) 21 - 52 %    MONOCYTES 9 3 - 10 %    EOSINOPHILS 2 0 - 5 %    BASOPHILS 0 0 - 2 %    ABS. NEUTROPHILS 7.9 1.8 - 8.0 K/UL    ABS. LYMPHOCYTES 1.7 0.9 - 3.6 K/UL    ABS. MONOCYTES 1.0 0.05 - 1.2 K/UL    ABS. EOSINOPHILS 0.2 0.0 - 0.4 K/UL    ABS. BASOPHILS 0.0 0.0 - 0.06 K/UL    DF AUTOMATED               Telemetry:normal sinus rhythm    Imaging:  I have personally reviewed the patient???s radiographs and have reviewed the reports:     High: Phenergan [Promethazine]    ?? Unspecified: Gluten;  Voltaren [Diclofenac Sodium]   ??   Result Information    ?? Status Provider Status      ?? Final result (Exam End: 05/02/2016 10:37 AM) Open    ??   Study Result    ?? EXAM: CT Chest   ??  INDICATION: Cough, elevated white blood cell count..  ??  COMPARISON: Chest radiograph 05/02/2016.Marland Kitchen  ??  TECHNIQUE: Axial CT imaging from the thoracic inlet through the diaphragm after  intravenous contrast. Multiplanar reformations were generated. One or more dose  reduction techniques were used on this CT: automated exposure control,  adjustment of the mAs and/or kVp according to patient's size, and iterative  reconstruction techniques. The specific techniques utilized on this CT exam have  been documented in the patient's electronic medical record.  ??  _______________  ??  FINDINGS:  ??   LUNGS: In keeping with radiographic findings, there is a large area of diffusely  abnormal opacity present within the superior segment of the right lower lobe,  with internal features of hypoattenuation and small soft tissue gas formation  (series 3, image 98). There is adjacent groundglass opacity, which is mild in  nature. This process respects the adjacent fissural surfaces. Overall  measurements are somewhat difficult given the oblong nature of this opacity,  with gross measurements of approximately 8.8 x 6.2 x 9.4 cm in size.  ??  Linear areas of atelectatic opacity are present at the dependent portions of  each lung base, as well as in the right upper and right middle lobes. No  additional site of focal pneumonic consolidation or suspicious pulmonary  nodule/mass.  ??  PLEURA: No pneumothorax or pleural effusion.  ??  AIRWAY: Central airways are patent.  ??  MEDIASTINUM: Diffuse atherosclerotic calcification of the thoracic aorta is  present without evidence of aneurysmal dilatation. Cardiac size is normal. There  is no pericardial effusion. Small amount of fluid present within the superior  pericardial recess.  ??  LYMPH NODES: No supra clavicular or axillary adenopathy. Increased number of  mediastinal lymph nodes are noted, with enlarged subcarinal lymph node measuring  approximately 1.4 x 2.2 cm in size.  ??  UPPER ABDOMEN: Unremarkable.  ??  OSSEOUS STRUCTURES: Unremarkable. No suspicious or blastic osseous lesions.  ??  OTHER:  ??  _______________  ??  IMPRESSION  IMPRESSION:  ??  ??  1. Abnormal radiographic opacity corresponds to a large area of abnormal  pulmonary parenchymal opacity with focal areas of hypoattenuation soft tissue  gas present within the superior segment of the right lower lobe, without  crossing the adjacent right major fissure. Overall imaging appearance is most  suggestive of a abscess forming pulmonary infectious process, with underlying   malignancy difficult to exclude on the basis of current imaging. No associated  pleural effusion is present. The adjacent subpleural fat, and lateral right ribs  are normal in appearance. Recommend cross-sectional imaging follow-up to  document expected resolution following treatment.  ??  2. Increased number of mediastinal lymph nodes, with likely reactive subcarinal  adenopathy.   ??              Total critical care time exclusive of procedures:    Alvera Novel, MD  05/04/2016, 12:51 PM

## 2016-05-04 NOTE — Other (Signed)
Bedside and Verbal shift change report given to N. Patterson, RN by Stephanie Morens, RN. Report included the following information SBAR, Kardex, OR Summary, Intake/Output and MAR.

## 2016-05-04 NOTE — Procedures (Signed)
Procedures  by Haani Bakula, Candace Cruiseavid F, MD at 05/04/16 1106                Author: Karl LukeSlat, Candace Cruiseavid F, MD  Service: RADIOLOGY  Author Type: Physician       Filed: 05/04/16 1107  Date of Service: 05/04/16 1106  Status: Signed          Editor: Keysean Savino, Candace Cruiseavid F, MD (Physician)                               Vascular & Interventional Radiology Brief Procedure Note      Interventional Radiologist: Tenna Childavid F Fianna Snowball, MD      Pre-operative Diagnosis:  IV ABX       Post-operative Diagnosis: Same as pre-op dx      Procedure(s) Performed:  Ultrasound/fluoroscopic guided PICC line placement      Anesthesia:  Local        Findings:  Successful right arm dual lumen 57F PICC line placement. Ready for immediate use.        Complications: None      Estimated Blood Loss:  minimal      Tubes and Drains: as above      Specimens: None      Condition: Stable          Tenna Childavid F Antavious Spanos, MD   [facility]   Vascular & Interventional Radiology   05/04/2016

## 2016-05-05 ENCOUNTER — Inpatient Hospital Stay: Admit: 2016-05-05 | Payer: MEDICARE | Primary: Family Medicine

## 2016-05-05 ENCOUNTER — Inpatient Hospital Stay: Payer: MEDICARE | Primary: Family Medicine

## 2016-05-05 LAB — METABOLIC PANEL, BASIC
Anion gap: 8 mmol/L (ref 3.0–18)
BUN/Creatinine ratio: 7 — ABNORMAL LOW (ref 12–20)
BUN: 4 MG/DL — ABNORMAL LOW (ref 7.0–18)
CO2: 28 mmol/L (ref 21–32)
Calcium: 7.9 MG/DL — ABNORMAL LOW (ref 8.5–10.1)
Chloride: 104 mmol/L (ref 100–108)
Creatinine: 0.58 MG/DL — ABNORMAL LOW (ref 0.6–1.3)
GFR est AA: >60 mL/min/{1.73_m2} (ref >60)
GFR est non-AA: >60 mL/min/{1.73_m2} (ref >60)
Glucose: 84 mg/dL (ref 74–99)
Potassium: 3.4 mmol/L — ABNORMAL LOW (ref 3.5–5.5)
Sodium: 140 mmol/L (ref 136–145)

## 2016-05-05 LAB — CBC WITH AUTOMATED DIFF
ABS. BASOPHILS: 0 10*3/uL (ref 0.0–0.06)
ABS. EOSINOPHILS: 0.3 10*3/uL (ref 0.0–0.4)
ABS. LYMPHOCYTES: 2 10*3/uL (ref 0.9–3.6)
ABS. MONOCYTES: 0.7 10*3/uL (ref 0.05–1.2)
ABS. NEUTROPHILS: 6.6 10*3/uL (ref 1.8–8.0)
BASOPHILS: 0 % (ref 0–2)
EOSINOPHILS: 3 % (ref 0–5)
HCT: 31 % — ABNORMAL LOW (ref 35.0–45.0)
HGB: 10.6 g/dL — ABNORMAL LOW (ref 12.0–16.0)
LYMPHOCYTES: 21 % (ref 21–52)
MCH: 31.5 PG (ref 24.0–34.0)
MCHC: 34.2 g/dL (ref 31.0–37.0)
MCV: 92 FL (ref 74.0–97.0)
MONOCYTES: 7 % (ref 3–10)
MPV: 10.4 FL (ref 9.2–11.8)
NEUTROPHILS: 69 % (ref 40–73)
PLATELET: 280 10*3/uL (ref 135–420)
RBC: 3.37 M/uL — ABNORMAL LOW (ref 4.20–5.30)
RDW: 13.1 % (ref 11.6–14.5)
WBC: 9.6 10*3/uL (ref 4.6–13.2)

## 2016-05-05 LAB — CULTURE, RESPIRATORY/SPUTUM/BRONCH W GRAM STAIN
GRAM STAIN: <10
GRAM STAIN: >25

## 2016-05-05 LAB — PROTHROMBIN TIME + INR
INR: 1.3 — ABNORMAL HIGH (ref 0.8–1.2)
Prothrombin time: 15.3 s — ABNORMAL HIGH (ref 11.5–15.2)

## 2016-05-05 LAB — MAGNESIUM: Magnesium: 1.7 mg/dL (ref 1.6–2.6)

## 2016-05-05 MED ORDER — POTASSIUM CHLORIDE SR 20 MEQ TAB, PARTICLES/CRYSTALS
20 mEq | ORAL | Status: AC
Start: 2016-05-05 — End: 2016-05-05
  Administered 2016-05-05: 17:00:00 via ORAL

## 2016-05-05 MED ORDER — MAGNESIUM SULFATE 2 GRAM/50 ML IVPB
2 gram/50 mL (4 %) | Freq: Once | INTRAVENOUS | Status: AC
Start: 2016-05-05 — End: 2016-05-07
  Administered 2016-05-05: 13:00:00 via INTRAVENOUS

## 2016-05-05 MED ORDER — LIDOCAINE (PF) 40 MG/ML (4 %) IJ SOLN
40 mg/mL (4 %) | INTRAMUSCULAR | Status: AC
Start: 2016-05-05 — End: ?

## 2016-05-05 MED ORDER — LIDOCAINE 2 % MUCOSAL GEL
2 % | Status: AC
Start: 2016-05-05 — End: ?

## 2016-05-05 MED ORDER — LIDOCAINE 2 % MUCOSAL GEL
2 % | Freq: Once | Status: DC
Start: 2016-05-05 — End: 2016-05-05
  Administered 2016-05-05: 14:00:00 via TOPICAL

## 2016-05-05 MED ORDER — FENTANYL CITRATE (PF) 50 MCG/ML IJ SOLN
50 mcg/mL | INTRAMUSCULAR | Status: DC | PRN
Start: 2016-05-05 — End: 2016-05-05
  Administered 2016-05-05 (×2): via INTRAVENOUS

## 2016-05-05 MED ORDER — NALOXONE 0.4 MG/ML INJECTION
0.4 mg/mL | INTRAMUSCULAR | Status: DC | PRN
Start: 2016-05-05 — End: 2016-05-05

## 2016-05-05 MED ORDER — LIDOCAINE 2 % MUCOSAL SOLN
2 % | Freq: Once | Status: DC
Start: 2016-05-05 — End: 2016-05-05
  Administered 2016-05-05: 14:00:00 via OROMUCOSAL

## 2016-05-05 MED ORDER — MIDAZOLAM 1 MG/ML IJ SOLN
1 mg/mL | INTRAMUSCULAR | Status: AC
Start: 2016-05-05 — End: ?

## 2016-05-05 MED ORDER — FLUMAZENIL 0.1 MG/ML IV SOLN
0.1 mg/mL | INTRAVENOUS | Status: DC | PRN
Start: 2016-05-05 — End: 2016-05-05

## 2016-05-05 MED ORDER — LIDOCAINE HCL 2 % (20 MG/ML) IJ SOLN
20 mg/mL (2 %) | INTRAMUSCULAR | Status: AC
Start: 2016-05-05 — End: ?

## 2016-05-05 MED ORDER — FENTANYL CITRATE (PF) 50 MCG/ML IJ SOLN
50 mcg/mL | INTRAMUSCULAR | Status: AC
Start: 2016-05-05 — End: ?

## 2016-05-05 MED ORDER — BENZOCAINE 20 % MUCOSAL AEROSOL SPRAY
20 % | Freq: Once | Status: AC
Start: 2016-05-05 — End: 2016-05-05
  Administered 2016-05-05 (×2)

## 2016-05-05 MED ORDER — MIDAZOLAM 1 MG/ML IJ SOLN
1 mg/mL | INTRAMUSCULAR | Status: DC | PRN
Start: 2016-05-05 — End: 2016-05-05
  Administered 2016-05-05 (×3): via INTRAVENOUS

## 2016-05-05 MED ORDER — LIDOCAINE HCL 1 % (10 MG/ML) IJ SOLN
10 mg/mL (1 %) | INTRAMUSCULAR | Status: AC
Start: 2016-05-05 — End: ?

## 2016-05-05 MED FILL — ZOSYN 4.5 GRAM INTRAVENOUS SOLUTION: 4.5 gram | INTRAVENOUS | Qty: 4.5

## 2016-05-05 MED FILL — LIDOCAINE 2 % MUCOSAL GEL: 2 % | Qty: 5

## 2016-05-05 MED FILL — PROPRANOLOL 20 MG TAB: 20 mg | ORAL | Qty: 1

## 2016-05-05 MED FILL — LIDOCAINE (PF) 40 MG/ML (4 %) IJ SOLN: 40 mg/mL (4 %) | INTRAMUSCULAR | Qty: 5

## 2016-05-05 MED FILL — MIDAZOLAM 1 MG/ML IJ SOLN: 1 mg/mL | INTRAMUSCULAR | Qty: 5

## 2016-05-05 MED FILL — HEPARIN (PORCINE) 5,000 UNIT/ML IJ SOLN: 5000 unit/mL | INTRAMUSCULAR | Qty: 1

## 2016-05-05 MED FILL — ACETAMINOPHEN 325 MG TABLET: 325 mg | ORAL | Qty: 2

## 2016-05-05 MED FILL — POTASSIUM CHLORIDE SR 20 MEQ TAB, PARTICLES/CRYSTALS: 20 mEq | ORAL | Qty: 1

## 2016-05-05 MED FILL — FENTANYL CITRATE (PF) 50 MCG/ML IJ SOLN: 50 mcg/mL | INTRAMUSCULAR | Qty: 2

## 2016-05-05 MED FILL — MAGNESIUM SULFATE 2 GRAM/50 ML IVPB: 2 gram/50 mL (4 %) | INTRAVENOUS | Qty: 50

## 2016-05-05 MED FILL — LIDOCAINE HCL 2 % (20 MG/ML) IJ SOLN: 20 mg/mL (2 %) | INTRAMUSCULAR | Qty: 20

## 2016-05-05 MED FILL — LIDOCAINE HCL 1 % (10 MG/ML) IJ SOLN: 10 mg/mL (1 %) | INTRAMUSCULAR | Qty: 20

## 2016-05-05 NOTE — Progress Notes (Signed)
Shift summary: Pt A&Ox4, up ad lib, ambulating to the bathroom. Pt denies pain. Slept through most of the night. Pt NPO since midnight.     Patient Vitals for the past 12 hrs:   Temp Pulse Resp BP SpO2   05/05/16 0805 (P) 97.9 ??F (36.6 ??C) (P) 96 (P) 18 (P) 130/57 98 %   05/05/16 0343 98.8 ??F (37.1 ??C) 81 18 162/63 95 %   05/04/16 2336 98.6 ??F (37 ??C) 81 20 183/80 98 %

## 2016-05-05 NOTE — Progress Notes (Signed)
Awaiting IVABX determination to assist with arranging OPIC.  CM to continue to follow.

## 2016-05-05 NOTE — Other (Signed)
Patient transported back to her room via bed by transportation.

## 2016-05-05 NOTE — Progress Notes (Addendum)
[facility]  Pulmonary, Critical Care, and Sleep Medicine    Name: Denise [name] MRN: [id number]   DOB: 1951-08-03 Hospital: [facility]   Date: 05/05/2016            Requesting MD:                                                  Reason for CC Consult:  IMPRESSION:   ?? Lung abscess , possibly malignancy given risk factors however less suspcious  ?? Nicotine dependence, quit 2 weeks ago  ?? GERD      RECOMMENDATIONS:   ?? Bronch today.  ?? Broaden abx coverage with zosyn and vanc  ?? Will attempt to schedule bronchoscopy tomorrow or Friday.  ?? Check for cocci IgG and IgM  ?? Full code     Subjective/History:     This patient has been seen and evaluated at the request of Dr.Hutchison for lung lesion.  Patient is a 65 y.o. female with 24yr smoking hx. 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 Also reports anorexia and weight loss.  No hx of hemoptysis. Denies night sweats.  No fever.  Denies diarrhea    I have reviewed the CT of the chest, it suggest of large area of opacity with varing density  Suggestive of infectious process.       6/1  Agreed to bronch  NO issues overnight  Coughing less  resp culture with normal flora.    Past Medical History:   Diagnosis Date   ??? Gastrointestinal disorder    ??? GERD (gastroesophageal reflux disease)    ??? Hypertension       Past Surgical History:   Procedure Laterality Date   ??? HX GYN        Prior to Admission medications    Medication Sig Start Date End Date Taking? Authorizing Provider   propranolol (INDERAL) 10 mg tablet Take 10 mg by mouth three (3) times daily.   Yes Phys Other, MD   naproxen sodium (ALEVE) 220 mg cap Take  by mouth.   Yes Phys Other, MD   pseudoephedrine (SUDAFED) 30 mg tablet Take  by mouth every four (4) hours as needed.   Yes Phys Other, MD   omeprazole (PRILOSEC) 20 mg capsule Take 1 Cap by mouth two (2) times a  day. 03/27/12  Yes Hetty Blend, MD     Current Facility-Administered Medications   Medication Dose Route Frequency   ??? potassium chloride (K-DUR, KLOR-CON) SR tablet 20 mEq  20 mEq Oral NOW   ??? lidocaine (XYLOCAINE) 2 % viscous solution 5 mL  5 mL Mouth/Throat ONCE   ??? lidocaine (XYLOCAINE) 2 % jelly   Topical ONCE   ??? benzocaine (HURRICANE) 20 % spray   Mucous Membrane ONCE   ??? heparin (porcine) injection 5,000 Units  5,000 Units SubCUTAneous Q8H   ??? piperacillin-tazobactam (ZOSYN) 4.5 g in 0.9% sodium chloride (MBP/ADV) 100 mL MBP  4.5 g IntraVENous Q6H   ??? propranolol (INDERAL) tablet 10 mg  10 mg Oral TID     Allergies   Allergen Reactions   ??? Phenergan [Promethazine] Anaphylaxis   ??? Gluten Other (comments)   ??? Voltaren [Diclofenac Sodium] Other (comments)     syncope  Social History   Substance Use Topics   ??? Smoking status: Former Smoker     Quit date: 04/19/2016   ??? Smokeless tobacco: Not on file   ??? Alcohol use Not on file      History reviewed. No pertinent family history.     Review of Systems:  A comprehensive review of systems was negative except for that written in the HPI.    Objective:   Vital Signs:    Visit Vitals   ??? BP 130/57 (BP 1 Location: Left arm, BP Patient Position: At rest)   ??? Pulse 96   ??? Temp 97.9 ??F (36.6 ??C)   ??? Resp 18   ??? Ht 5' 4.5" (1.638 m)   ??? Wt 64.6 kg (142 lb 6.4 oz)   ??? SpO2 98%   ??? BMI 24.07 kg/m2       O2 Device: Room air       Temp (24hrs), Avg:98.4 ??F (36.9 ??C), Min:97.9 ??F (36.6 ??C), Max:98.8 ??F (37.1 ??C)       Intake/Output:   Last shift:         Last 3 shifts: 05/30 1901 - 06/01 0700  In: 1725 [I.V.:1725]  Out: -   No intake or output data in the 24 hours ending 05/05/16 0957  Hemodynamics:   .      .        Ventilator Settings:  Mode Rate Tidal Volume Pressure FiO2 PEEP                    Peak airway pressure:      Minute ventilation:        ARDS network Guidelines: Lung protective strategy and Pl pressure goals____________    Physical Exam:     General:  Alert, cooperative, no distress, appears stated age.   Head:  Normocephalic, without obvious abnormality, atraumatic.   Eyes:  Conjunctivae/corneas clear. PERRL, EOMs intact.   Nose: Nares normal. Septum midline. Mucosa normal. No drainage or sinus tenderness.   Throat: Lips, mucosa, and tongue normal. Teeth and gums normal.   Neck: Supple, symmetrical, trachea midline, no adenopathy, thyroid: no enlargment/tenderness/nodules, no carotid bruit and no JVD.   Back:   Symmetric, no curvature. ROM normal.   Lungs:   Clear to auscultation bilaterally.   Chest wall:  No tenderness or deformity.   Heart:  Regular rate and rhythm, S1, S2 normal, no murmur, click, rub or gallop.   Abdomen:   Soft, non-tender. Bowel sounds normal. No masses,  No organomegaly.   Extremities: Extremities normal, atraumatic, no cyanosis or edema.   Pulses: 2+ and symmetric all extremities.   Skin: Skin color, texture, turgor normal. No rashes or lesions   Lymph nodes: Cervical, supraclavicular, and axillary nodes normal.   Neurologic: Grossly nonfocal       Data:     Recent Results (from the past 24 hour(s))   CBC WITH AUTOMATED DIFF    Collection Time: 05/05/16  5:00 AM   Result Value Ref Range    WBC 9.6 4.6 - 13.2 K/uL    RBC 3.37 (L) 4.20 - 5.30 M/uL    HGB 10.6 (L) 12.0 - 16.0 g/dL    HCT 09.8 (L) 11.9 - 45.0 %    MCV 92.0 74.0 - 97.0 FL    MCH 31.5 24.0 - 34.0 PG    MCHC 34.2 31.0 - 37.0 g/dL    RDW 14.7 82.9 - 56.2 %    PLATELET 280 135 -  420 K/uL    MPV 10.4 9.2 - 11.8 FL    NEUTROPHILS 69 40 - 73 %    LYMPHOCYTES 21 21 - 52 %    MONOCYTES 7 3 - 10 %    EOSINOPHILS 3 0 - 5 %    BASOPHILS 0 0 - 2 %    ABS. NEUTROPHILS 6.6 1.8 - 8.0 K/UL    ABS. LYMPHOCYTES 2.0 0.9 - 3.6 K/UL    ABS. MONOCYTES 0.7 0.05 - 1.2 K/UL    ABS. EOSINOPHILS 0.3 0.0 - 0.4 K/UL    ABS. BASOPHILS 0.0 0.0 - 0.06 K/UL    DF AUTOMATED     METABOLIC PANEL, BASIC    Collection Time: 05/05/16  5:00 AM   Result Value Ref Range    Sodium 140 136 - 145 mmol/L     Potassium 3.4 (L) 3.5 - 5.5 mmol/L    Chloride 104 100 - 108 mmol/L    CO2 28 21 - 32 mmol/L    Anion gap 8 3.0 - 18 mmol/L    Glucose 84 74 - 99 mg/dL    BUN 4 (L) 7.0 - 18 MG/DL    Creatinine 1.610.58 (L) 0.6 - 1.3 MG/DL    BUN/Creatinine ratio 7 (L) 12 - 20      GFR est AA >60 >60 ml/min/1.6873m2    GFR est non-AA >60 >60 ml/min/1.3373m2    Calcium 7.9 (L) 8.5 - 10.1 MG/DL   MAGNESIUM    Collection Time: 05/05/16  5:00 AM   Result Value Ref Range    Magnesium 1.7 1.6 - 2.6 mg/dL   PROTHROMBIN TIME + INR    Collection Time: 05/05/16  5:00 AM   Result Value Ref Range    Prothrombin time 15.3 (H) 11.5 - 15.2 sec    INR 1.3 (H) 0.8 - 1.2               Telemetry:normal sinus rhythm    Imaging:  I have personally reviewed the patient???s radiographs and have reviewed the reports:     High: Phenergan [Promethazine]    ?? Unspecified: Gluten;  Voltaren [Diclofenac Sodium]   ??   Result Information    ?? Status Provider Status      ?? Final result (Exam End: 05/02/2016 10:37 AM) Open    ??   Study Result    ?? EXAM: CT Chest   ??  INDICATION: Cough, elevated white blood cell count..  ??  COMPARISON: Chest radiograph 05/02/2016.Marland Kitchen.  ??  TECHNIQUE: Axial CT imaging from the thoracic inlet through the diaphragm after  intravenous contrast. Multiplanar reformations were generated. One or more dose  reduction techniques were used on this CT: automated exposure control,  adjustment of the mAs and/or kVp according to patient's size, and iterative  reconstruction techniques. The specific techniques utilized on this CT exam have  been documented in the patient's electronic medical record.  ??  _______________  ??  FINDINGS:  ??  LUNGS: In keeping with radiographic findings, there is a large area of diffusely  abnormal opacity present within the superior segment of the right lower lobe,  with internal features of hypoattenuation and small soft tissue gas formation  (series 3, image 98). There is adjacent groundglass opacity, which is mild in   nature. This process respects the adjacent fissural surfaces. Overall  measurements are somewhat difficult given the oblong nature of this opacity,  with gross measurements of approximately 8.8 x 6.2 x 9.4 cm in  size.  ??  Linear areas of atelectatic opacity are present at the dependent portions of  each lung base, as well as in the right upper and right middle lobes. No  additional site of focal pneumonic consolidation or suspicious pulmonary  nodule/mass.  ??  PLEURA: No pneumothorax or pleural effusion.  ??  AIRWAY: Central airways are patent.  ??  MEDIASTINUM: Diffuse atherosclerotic calcification of the thoracic aorta is  present without evidence of aneurysmal dilatation. Cardiac size is normal. There  is no pericardial effusion. Small amount of fluid present within the superior  pericardial recess.  ??  LYMPH NODES: No supra clavicular or axillary adenopathy. Increased number of  mediastinal lymph nodes are noted, with enlarged subcarinal lymph node measuring  approximately 1.4 x 2.2 cm in size.  ??  UPPER ABDOMEN: Unremarkable.  ??  OSSEOUS STRUCTURES: Unremarkable. No suspicious or blastic osseous lesions.  ??  OTHER:  ??  _______________  ??  IMPRESSION  IMPRESSION:  ??  ??  1. Abnormal radiographic opacity corresponds to a large area of abnormal  pulmonary parenchymal opacity with focal areas of hypoattenuation soft tissue  gas present within the superior segment of the right lower lobe, without  crossing the adjacent right major fissure. Overall imaging appearance is most  suggestive of a abscess forming pulmonary infectious process, with underlying  malignancy difficult to exclude on the basis of current imaging. No associated  pleural effusion is present. The adjacent subpleural fat, and lateral right ribs  are normal in appearance. Recommend cross-sectional imaging follow-up to  document expected resolution following treatment.  ??  2. Increased number of mediastinal lymph nodes, with likely reactive subcarinal   adenopathy.   ??              Total critical care time exclusive of procedures:    Alvera Novel, MD  05/05/2016, 12:51 PM    MD Assessment prior to bronchoscopy  ASA 2  MP  2

## 2016-05-05 NOTE — Progress Notes (Signed)
Hospitalist Progress Note-critical care note     Patient: Denise DickerJanet Jo [name] MRN: [id number]  CSN: [id number]    Date of Birth: 06-12-1951  Age: 65 y.o.  Sex: female    DOA: 05/02/2016 LOS:  LOS: 3 days            Chief complaint: lung abscess, hypokalemia     Assessment/Plan         Patient Active Problem List   Diagnosis Code   ??? Lung abscess without pneumonia (HCC) J85.2   ??? COPD (chronic obstructive pulmonary disease) (HCC) J44.9   ??? Weight loss R63.4   ??? Celiac disease K90.0   ??? Hypokalemia E87.6     1. Lung abscess vs malignant   -mild improving   -ID and  Pulm on board  -will continue IV Zosyn and Vanc, till respiratory  cx few rod and cocci  -Hx of coccidiomycosis in 1983;   -hiv negative,   -picc line placed   -pulm on board, will have bronch      2. Tremor   Continue inderal    3. Celiac disease  -gluten free diet    4. Weight loss  appetite better   Adenopathy on ct chest  Need to r/o malignancy     5. COPD with long hx of smoking:   -quitted smoking 2 wks ago   stable  Duo neb prn    6 hypokalemia   K replacement . Mg replace to keep mg 2 ,       Subjective: feel better     Nurse : no acute issue    Review of systems:    General: No fevers . No chills , less fatigue   Cardiovascular: No chest pain or pressure. No palpitations.   Pulmonary: No shortness of breath.   Gastrointestinal: No nausea, vomiting.     Vital signs/Intake and Output:  Visit Vitals   ??? BP 130/57 (BP 1 Location: Left arm, BP Patient Position: At rest)   ??? Pulse 96   ??? Temp 97.9 ??F (36.6 ??C)   ??? Resp 18   ??? Ht 5' 4.5" (1.638 m)   ??? Wt 64.6 kg (142 lb 6.4 oz)   ??? SpO2 98%   ??? BMI 24.07 kg/m2     Current Shift:     Last three shifts:  05/30 1901 - 06/01 0700  In: 1725 [I.V.:1725]  Out: -     Physical Exam:  General: WD, WN.  Alert, cooperative, no acute distress????  HEENT: NC, Atraumatic.  PERRLA, anicteric sclerae.  Lungs: Clear bilateral , no wheezing   Heart:  Regular  rhythm,?? +murmur, No Rubs, No Gallops   Abdomen: Soft, Non distended, Non tender. ??+Bowel sounds,   Extremities: No c/c/e  Psych:???? Not anxious or agitated.  Neurologic:?? No acute neurological deficit.             Labs: Results:       Chemistry Recent Labs      05/05/16   0500  05/04/16   0546  05/03/16   0150   GLU  84  105*  111*   NA  140  138  137   K  3.4*  3.3*  3.6   CL  104  105  101   CO2  28  24  25    BUN  4*  4*  9   CREA  0.58*  0.62  0.78   CA  7.9*  7.7*  7.8*  AGAP  BUCR  7*  6*  12      CBC w/Diff Recent Labs      05/05/16   0500  05/04/16   0546  05/03/16   0150   WBC  9.6  10.9  13.0   RBC  3.37*  3.42*  3.60*   HGB  10.6*  10.9*  11.4*   HCT  31.0*  31.6*  33.2*   PLT  280  269  281   GRANS  69  73  77*   LYMPH  21  16*  13*   EOS  Cardiac Enzymes No results for input(s): CPK, CKND1, MYO in the last 72 hours.    No lab exists for component: CKRMB, TROIP   Coagulation Recent Labs      05/05/16   0500  05/03/16   0818   PTP  15.3*  16.3*   INR  1.3*  1.4*       Lipid Panel No results found for: CHOL, CHOLPOCT, CHOLX, CHLST, CHOLV, [id number], HDL, LDL, NLDLCT, DLDL, LDLC, DLDLP, [id number], VLDLC, VLDL, TGL, TGLX, TRIGL, [id number], TRIGP, TGLPOCT, [id number], CHHD, CHHDX   BNP No results for input(s): BNPP in the last 72 hours.   Liver Enzymes No results for input(s): TP, ALB, TBIL, AP, SGOT, GPT in the last 72 hours.    No lab exists for component: DBIL   Thyroid Studies No results found for: T4, T3U, TSH, TSHEXT, TSHEXT     Procedures/imaging: see electronic medical records for all procedures/Xrays and details which were not copied into this note but were reviewed prior to creation of Plan        Wendi Snipes, MD

## 2016-05-05 NOTE — Progress Notes (Signed)
Infectious Disease Progress Note  ????  Antibiotics:  zosyn  ????  CC: Lung abscess  ????  Subj: [name] is a 65 yo long time smoker admitted with progressive coughing with foul tasting sputum, sweats, anorexia. Pt reports some weight loss over the past 3-5 years - but relates this more to diagnosis of celiac sprue than unintentional weight loss. No weight loss in the past year.  ????  Pt sitting up in bed and reports continued cough with productive sputum. She is s/p bronchoscopy earlier today with findings most c/w abscess. Cultures and pathology pending. She denies any fevers or chills. Denies any n/v/d. States that her taste is still off.  No dysuria or frequency.   ????  Physical Exam:  Vital signs reviewed  Tmax 98.8  General: NAD Non toxic. No resp distress  HEENT: PERRL, OP clear, sclera anicteric  Neck: Supple  CHest: CTA B. No rhonchi or wheeze noted. Slightly decreased at R base  CV: RRR no murmur noted  Abd: Soft NT ND  Ext: No edema  Skin: No rash  ????  Labs, cultures and radiology reviewed  Wbc 9.6, Hb=10.6, Plt=280  Cr=0.58  ????  5/29 blood cx x 1: NGTD  5/29 Sputum: >25 wbc, <10 Epi Few G+C pairs/chains Rare GNR - normal flora  5/30: sputum: normal flora  6/1: BAL fungal: pending  BAL AFB: pending  BAL bacterial: pending  ????  Assessment/ Plan:  - RLL lung abscess with reactive subcarinal adnopathy  Unable to exclude underlying malignancy in this long time cigarette user  No hx of ETOH abuse or poor dentition to increase risk of abscess  ==> Will continue zosyn pending culture results  Pt will require prolonged course of IV abx with serial radiographic monitoring  ==>f/u BAL results  ==>will need PICC Placement  ????  - Nicotine dependence - long term  Quit 2 weeks prior to admission  Have counseled to avoid further use  ????  - ? 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  - Celiac Sprue diagnosed recently  Weight loss has stabilized with appropriate therapy.   ????      ???? ????

## 2016-05-05 NOTE — Other (Signed)
Bedside and Verbal shift change report given to Ohaion Allen, RN (oncoming nurse) by Brittany Collins, RN (offgoing nurse). Report included the following information SBAR and Kardex.

## 2016-05-05 NOTE — Progress Notes (Signed)
Shift summary: pt pleasant and cooeperative with care. No c/o voiced. Pt up ad lib to BR. Pt resting quietly most of shift. Pt with poor appetite, nutritional supplements ordered for meal times. tolerated ensure well.

## 2016-05-05 NOTE — Other (Signed)
Bedside and Verbal shift change report given to N. Gallon, RN (oncoming nurse) by A. Pryor, RN (offgoing nurse). Report included the following information SBAR, Kardex, Intake/Output and MAR.

## 2016-05-05 NOTE — Procedures (Signed)
[facility]  Pulmonary, Critical Care, and Sleep Medicine    Name: Denise [name] MRN: [id number]   DOB: 03/25/1951 Hospital: [facility]   Date: 05/05/2016        Bronchoscopy Report    Procedure: Diagnostic bronchoscopy.    Indication: Abnormal chest imaging    Consent/Treatment: Informed consent was obtained from the  patient after risks, benefits and alternatives were explained. Timeout verified the correct patient and correct procedure.     Anesthesia:   Topical sedation to nares, mouth, and tracheobronchial tree with lidocaine    Procedure Details:   -- The bronchoscope was introduced through the  left nare.   -- The vocal cords were found to be normal.  -- The trachea and carina were completely inspected and were found to be normal.  -- The right-sided endobronchial anatomy was completely inspected and distal basal segement appears to be narrow early evidence of stenosis likely due to chronic infection  -- The left-sided endobronchial anatomy was completely inspected and was found to be normal.     Specimens:   Bronchial washings were sent for  microbiology, cytology, AFB smear and culture and fungal culture    Rapid On-Site Evaluation: A preliminary diagnosis of lung abscess, early evidence of bronchial narrowing/    Complications: none    Estimated Blood Loss: Minimal    Isidore Margraf S Rudi Knippenberg, MD  May 05, 2016  12:51 PM

## 2016-05-05 NOTE — Progress Notes (Signed)
Patient alert and awake with no c/o pain or distress. Assessment complete. Call light in reach, safety and comfort measures are in place.

## 2016-05-05 NOTE — Other (Signed)
Bedside shift change report given to Britteny/Jessica Polanco, Rn (oncoming nurse) by Rudene ReKristie A Reeve, RN   (offgoing nurse). Report included the following information SBAR, Kardex, Procedure Summary, Intake/Output, MAR and Recent Results.

## 2016-05-05 NOTE — Other (Signed)
Report called to W. R. Berkleyatasha Gallon RN on 3 south, Fentanyl 100 mg, and Versed 4 mg given during he procedure. VSS. Patient alert and oriented.

## 2016-05-05 NOTE — Progress Notes (Signed)
1945: Pt complaints of feeling dizzy, states she has felt like this in the past about a year ago. VS taken, stated she had low blood sugar sometimes so blood sugar was checked and was high. Head to toe assessment was done, after assessment patient stated she "felt better" that it may just be anxiety. Ambulated to rest room with assistance. Slightly unsteady gait. Will continue to monitor.

## 2016-05-05 NOTE — Other (Signed)
MD assessment completed per Dr. Barbee CoughPak and recorded in his H&P

## 2016-05-05 NOTE — Procedures (Signed)
[facility]  Pulmonary, Critical Care, and Sleep Medicine    Name: Verl DickerJanet Jo Pickelsimer MRN: [id number]   DOB: [birth year] [facility]   Date: 05/05/2016        Bronchoscopy Report    Procedure: Diagnostic bronchoscopy.    Indication: Abnormal chest imaging    Consent/Treatment: Informed consent was obtained from the  patient after risks, benefits and alternatives were explained. Timeout verified the correct patient and correct procedure.     Anesthesia:   Topical sedation to nares, mouth, and tracheobronchial tree with lidocaine    Procedure Details:   -- The bronchoscope was introduced through the  left nare.   -- The vocal cords were found to be normal.  -- The trachea and carina were completely inspected and were found to be normal.  -- The right-sided endobronchial anatomy was completely inspected and distal basal segement appears to be narrow early evidence of stenosis likely due to chronic infection  -- The left-sided endobronchial anatomy was completely inspected and was found to be normal.     Specimens:   Bronchial washings were sent for  microbiology, cytology, AFB smear and culture and fungal culture    Rapid On-Site Evaluation: A preliminary diagnosis of lung abscess, early evidence of bronchial narrowing/    Complications: none    Estimated Blood Loss: Minimal    Alvera NovelEdward S Pak, MD  May 05, 2016  12:51 PM

## 2016-05-06 LAB — CBC WITH AUTOMATED DIFF
ABS. BASOPHILS: 0 10*3/uL (ref 0.0–0.06)
ABS. EOSINOPHILS: 0.2 10*3/uL (ref 0.0–0.4)
ABS. LYMPHOCYTES: 2.2 10*3/uL (ref 0.9–3.6)
ABS. MONOCYTES: 0.7 10*3/uL (ref 0.05–1.2)
ABS. NEUTROPHILS: 5.7 10*3/uL (ref 1.8–8.0)
BASOPHILS: 0 % (ref 0–2)
EOSINOPHILS: 3 % (ref 0–5)
HCT: 30.5 % — ABNORMAL LOW (ref 35.0–45.0)
HGB: 10.4 g/dL — ABNORMAL LOW (ref 12.0–16.0)
LYMPHOCYTES: 25 % (ref 21–52)
MCH: 31.3 PG (ref 24.0–34.0)
MCHC: 34.1 g/dL (ref 31.0–37.0)
MCV: 91.9 FL (ref 74.0–97.0)
MONOCYTES: 8 % (ref 3–10)
MPV: 10.1 FL (ref 9.2–11.8)
NEUTROPHILS: 64 % (ref 40–73)
PLATELET: 295 10*3/uL (ref 135–420)
RBC: 3.32 M/uL — ABNORMAL LOW (ref 4.20–5.30)
RDW: 13.1 % (ref 11.6–14.5)
WBC: 8.9 10*3/uL (ref 4.6–13.2)

## 2016-05-06 LAB — CULTURE, RESPIRATORY/SPUTUM/BRONCH W GRAM STAIN: GRAM STAIN: <10

## 2016-05-06 LAB — GLUCOSE, POC: Glucose (POC): 163 mg/dL — ABNORMAL HIGH (ref 70–110)

## 2016-05-06 LAB — METABOLIC PANEL, BASIC
Anion gap: 8 mmol/L (ref 3.0–18)
BUN/Creatinine ratio: 8 — ABNORMAL LOW (ref 12–20)
BUN: 5 MG/DL — ABNORMAL LOW (ref 7.0–18)
CO2: 28 mmol/L (ref 21–32)
Calcium: 7.8 MG/DL — ABNORMAL LOW (ref 8.5–10.1)
Chloride: 104 mmol/L (ref 100–108)
Creatinine: 0.61 MG/DL (ref 0.6–1.3)
GFR est AA: >60 mL/min/{1.73_m2} (ref >60)
GFR est non-AA: >60 mL/min/{1.73_m2} (ref >60)
Glucose: 82 mg/dL (ref 74–99)
Potassium: 3.6 mmol/L (ref 3.5–5.5)
Sodium: 140 mmol/L (ref 136–145)

## 2016-05-06 LAB — MAGNESIUM: Magnesium: 1.9 mg/dL (ref 1.6–2.6)

## 2016-05-06 MED ORDER — ERTAPENEM 1 GRAM SOLUTION FOR INJECTION
1 gram | INTRAMUSCULAR | Status: DC
Start: 2016-05-06 — End: 2016-05-07
  Administered 2016-05-06 – 2016-05-07 (×2): via INTRAVENOUS

## 2016-05-06 MED FILL — INVANZ 1 GRAM SOLUTION FOR INJECTION: 1 gram | INTRAMUSCULAR | Qty: 1

## 2016-05-06 MED FILL — ZOSYN 4.5 GRAM INTRAVENOUS SOLUTION: 4.5 gram | INTRAVENOUS | Qty: 4.5

## 2016-05-06 MED FILL — HEPARIN (PORCINE) 5,000 UNIT/ML IJ SOLN: 5000 unit/mL | INTRAMUSCULAR | Qty: 1

## 2016-05-06 MED FILL — PROPRANOLOL 20 MG TAB: 20 mg | ORAL | Qty: 1

## 2016-05-06 NOTE — Progress Notes (Signed)
Noted physician is awaiting cytology report.  Pt will likely discharge Monday with [facility].  Pt does not have a PCP and will have to have one arranged.  CM continuing to follow and assist with plan of care needs.

## 2016-05-06 NOTE — Other (Signed)
Bedside and Verbal shift change report given to Nicole Patterson, RN (oncoming nurse) by Ohaion Allen, RN (offgoing nurse). Report included the following information SBAR and Kardex.

## 2016-05-06 NOTE — Progress Notes (Signed)
Infectious Disease Progress Note  ????  Antibiotics:  Zosyn-->ertapenem  ????  CC: Lung abscess  ????  Subj: Denise [name] is a 65 yo long time smoker admitted with progressive coughing with foul tasting sputum, sweats, anorexia. Pt reports some weight loss over the past 3-5 years - but relates this more to diagnosis of celiac sprue than unintentional weight loss. No weight loss in the past year.  ????  Pt sitting up in bed and reports continued cough with productive sputum but overall is feeling better.  She is s/p bronchoscopy on 6/1 with findings most c/w abscess. Cultures currently with normal flora, cytology without malignant cells.  She denies any fevers or chills. Denies any n/v/d. States that her taste is still off.  No dysuria or frequency.   ????  Physical Exam:  Vital signs reviewed  Tmax 98.9  General: NAD Non toxic. No resp distress  HEENT: PERRL, OP clear, sclera anicteric  Neck: Supple  CHest: CTA B. No rhonchi or wheeze noted. Slightly decreased at R base  CV: RRR no murmur noted  Abd: Soft NT ND  Ext: No edema  Skin: No rash  ????  Labs, cultures and radiology reviewed  Wbc 8.9, Hb=10.4, Plt=295  Cr=0.61  ????  5/29 blood cx x 1: NGTD  5/29 Sputum: >25 wbc, <10 Epi Few G+C pairs/chains Rare GNR - normal flora  5/30: sputum: normal flora  6/1: BAL fungal: no fungal elements  BAL AFB: pending  BAL bacterial: few normal flora  ????  Assessment/ Plan:  - RLL lung abscess with reactive subcarinal adnopathy  Unable to exclude underlying malignancy in this long time cigarette user  No hx of ETOH abuse or poor dentition to increase risk of abscess  ==> Will change to ertapenem in anticipation of discharge  ==>f/u culture results  Pt will require prolonged course of IV abx with serial radiographic monitoring - have written for a 6 week course of therapy with plans to repeat imaging near the end of treatment - orders on chart  ==>f/u BAL results  ==>patient was counseled that antibiotics can kill off the good bacteria  in the colon leading to an overgrowth of bad bacteria called c. Diff. To help prevent this, I recommended that she take a daily probiotic supplement and/or eat a daily yogurt with live active cultures. She is to report the development of > 3 loose stools/day.   ????  - Nicotine dependence - long term  Quit 2 weeks prior to admission  Have counseled to avoid further use  ????  - ? 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  - Celiac Sprue diagnosed recently  Weight loss has stabilized with appropriate therapy.   ????      ???? ????

## 2016-05-06 NOTE — Other (Signed)
Bedside and Verbal shift change report given to P. Grizotti RN (oncoming nurse) by Judie PetitM. Jarold MottoPatterson RN Physiological scientist(offgoing nurse). Report included the following information SBAR, Kardex, Intake/Output and MAR.   Visit Vitals   ??? BP 163/63 (BP 1 Location: Left arm, BP Patient Position: At rest)   ??? Pulse 78   ??? Temp 97.8 ??F (36.6 ??C)   ??? Resp 20   ??? Ht 5' 4.5" (1.638 m)   ??? Wt 66 kg (145 lb 8 oz)   ??? SpO2 98%   ??? BMI 24.59 kg/m2

## 2016-05-06 NOTE — Progress Notes (Signed)
Patient instructed on use of Flutter valve. Patient demonstrated and acknowledges that she is to use it q4WA, but can use it more if needed.

## 2016-05-06 NOTE — Progress Notes (Addendum)
received report from O. Allen RN.     [phone] patient assessment was completed at this time.  Patient states that she is not having any pain at this time.  Patient is upset and states that she cant afford this, spoke with patient calmed her down and Care management would be contacted  to talk to her. Patient resting in the bed.     Shift summary- rest of shift was uneventful.  Patient tolerated medications well.

## 2016-05-06 NOTE — Progress Notes (Signed)
[facility]  Pulmonary, Critical Care, and Sleep Medicine    Name: Denise [name] MRN: [id number]   DOB: 04-28-1951 Hospital: [facility]   Date: 05/06/2016            Requesting MD:                                                  Reason for CC Consult:  IMPRESSION:   ?? Lung abscess ,   ?? Nicotine dependence, quit 2 weeks ago  ?? GERD      RECOMMENDATIONS:   ?? Continue IV abx  ?? Cultures and cytology pending  ?? Out of bed  ?? Pt/Ot  ?? acapella/aerobika  ?? Full code     Subjective/History:     This patient has been seen and evaluated at the request of Dr.Hutchison for lung lesion.  Patient is a 65 y.o. female with 47yr smoking hx. 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 Also reports anorexia and weight loss.  No hx of hemoptysis. Denies night sweats.  No fever.  Denies diarrhea    I have reviewed the CT of the chest, it suggest of large area of opacity with varing density  Suggestive of infectious process.       6/2  Tolerated the bronchoscopy yesterday.  Evidence of purulent secretions for basilar segments.  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      Past Medical History:   Diagnosis Date   ??? Gastrointestinal disorder    ??? GERD (gastroesophageal reflux disease)    ??? Hypertension       Past Surgical History:   Procedure Laterality Date   ??? HX GYN        Prior to Admission medications    Medication Sig Start Date End Date Taking? Authorizing Provider   propranolol (INDERAL) 10 mg tablet Take 10 mg by mouth three (3) times daily.   Yes Phys Other, MD   naproxen sodium (ALEVE) 220 mg cap Take  by mouth.   Yes Phys Other, MD   pseudoephedrine (SUDAFED) 30 mg tablet Take  by mouth every four (4) hours as needed.   Yes Phys Other, MD   omeprazole (PRILOSEC) 20 mg capsule Take 1 Cap by mouth two (2) times a  day. 03/27/12  Yes Hetty Blend, MD     Current Facility-Administered Medications   Medication Dose Route Frequency   ??? heparin (porcine) injection 5,000 Units  5,000 Units SubCUTAneous Q8H   ??? piperacillin-tazobactam (ZOSYN) 4.5 g in 0.9% sodium chloride (MBP/ADV) 100 mL MBP  4.5 g IntraVENous Q6H   ??? propranolol (INDERAL) tablet 10 mg  10 mg Oral TID     Allergies   Allergen Reactions   ??? Phenergan [Promethazine] Anaphylaxis   ??? Gluten Other (comments)   ??? Voltaren [Diclofenac Sodium] Other (comments)     syncope      Social History   Substance Use Topics   ??? Smoking status: Former Smoker     Quit date: 04/19/2016   ??? Smokeless tobacco: Not on file   ??? Alcohol use Not on file      History reviewed. No pertinent family history.     Review of  Systems:  A comprehensive review of systems was negative except for that written in the HPI.    Objective:   Vital Signs:    Visit Vitals   ??? BP (P) 179/70 (BP 1 Location: Left arm, BP Patient Position: At rest)   ??? Pulse (P) 74   ??? Temp (P) 98 ??F (36.7 ??C)   ??? Resp (P) 20   ??? Ht 5' 4.5" (1.638 m)   ??? Wt 66 kg (145 lb 8 oz)   ??? SpO2 (P) 96%   ??? BMI 24.59 kg/m2       O2 Device: Room air   O2 Flow Rate (L/min): 2 l/min   Temp (24hrs), Avg:97.9 ??F (36.6 ??C), Min:96.9 ??F (36.1 ??C), Max:98.9 ??F (37.2 ??C)       Intake/Output:   Last shift:         Last 3 shifts: 05/31 1901 - 06/02 0700  In: 649.7 [P.O.:60; I.V.:589.7]  Out: 101 [Urine:101]    Intake/Output Summary (Last 24 hours) at 05/06/16 0917  Last data filed at 05/06/16 0531   Gross per 24 hour   Intake           649.66 ml   Output              101 ml   Net           548.66 ml     Hemodynamics:   .      .        Ventilator Settings:  Mode Rate Tidal Volume Pressure FiO2 PEEP                    Peak airway pressure:      Minute ventilation:        ARDS network Guidelines: Lung protective strategy and Pl pressure goals____________    Physical Exam:    General:  Alert, cooperative, no distress, appears stated age.    Head:  Normocephalic, without obvious abnormality, atraumatic.   Eyes:  Conjunctivae/corneas clear. PERRL, EOMs intact.   Nose: Nares normal. Septum midline. Mucosa normal. No drainage or sinus tenderness.   Throat: Lips, mucosa, and tongue normal. Teeth and gums normal.   Neck: Supple, symmetrical, trachea midline, no adenopathy, thyroid: no enlargment/tenderness/nodules, no carotid bruit and no JVD.   Back:   Symmetric, no curvature. ROM normal.   Lungs:   Clear to auscultation bilaterally.   Chest wall:  No tenderness or deformity.   Heart:  Regular rate and rhythm, S1, S2 normal, no murmur, click, rub or gallop.   Abdomen:   Soft, non-tender. Bowel sounds normal. No masses,  No organomegaly.   Extremities: Extremities normal, atraumatic, no cyanosis or edema.   Pulses: 2+ and symmetric all extremities.   Skin: Skin color, texture, turgor normal. No rashes or lesions   Lymph nodes: Cervical, supraclavicular, and axillary nodes normal.   Neurologic: Grossly nonfocal       Data:     Recent Results (from the past 24 hour(s))   CULTURE, RESPIRATORY/SPUTUM/BRONCH W GRAM STAIN    Collection Time: 05/05/16 10:54 AM   Result Value Ref Range    Special Requests: NO SPECIAL REQUESTS      GRAM STAIN FEW  WBC'S        GRAM STAIN NO ORGANISMS SEEN      Culture result: PENDING    CULTURE, FUNGUS    Collection Time: 05/05/16 10:54 AM   Result Value Ref Range    Special Requests: NO  SPECIAL REQUESTS      FUNGUS SMEAR NO FUNGAL ELEMENTS SEEN      Culture result: PENDING    GLUCOSE, POC    Collection Time: 05/05/16  7:56 PM   Result Value Ref Range    Glucose (POC) 163 (H) 70 - 110 mg/dL   CBC WITH AUTOMATED DIFF    Collection Time: 05/06/16  3:00 AM   Result Value Ref Range    WBC 8.9 4.6 - 13.2 K/uL    RBC 3.32 (L) 4.20 - 5.30 M/uL    HGB 10.4 (L) 12.0 - 16.0 g/dL    HCT 16.1 (L) 09.6 - 45.0 %    MCV 91.9 74.0 - 97.0 FL    MCH 31.3 24.0 - 34.0 PG    MCHC 34.1 31.0 - 37.0 g/dL    RDW 04.5 40.9 - 81.1 %     PLATELET 295 135 - 420 K/uL    MPV 10.1 9.2 - 11.8 FL    NEUTROPHILS 64 40 - 73 %    LYMPHOCYTES 25 21 - 52 %    MONOCYTES 8 3 - 10 %    EOSINOPHILS 3 0 - 5 %    BASOPHILS 0 0 - 2 %    ABS. NEUTROPHILS 5.7 1.8 - 8.0 K/UL    ABS. LYMPHOCYTES 2.2 0.9 - 3.6 K/UL    ABS. MONOCYTES 0.7 0.05 - 1.2 K/UL    ABS. EOSINOPHILS 0.2 0.0 - 0.4 K/UL    ABS. BASOPHILS 0.0 0.0 - 0.06 K/UL    DF AUTOMATED     METABOLIC PANEL, BASIC    Collection Time: 05/06/16  3:00 AM   Result Value Ref Range    Sodium 140 136 - 145 mmol/L    Potassium 3.6 3.5 - 5.5 mmol/L    Chloride 104 100 - 108 mmol/L    CO2 28 21 - 32 mmol/L    Anion gap 8 3.0 - 18 mmol/L    Glucose 82 74 - 99 mg/dL    BUN 5 (L) 7.0 - 18 MG/DL    Creatinine 9.14 0.6 - 1.3 MG/DL    BUN/Creatinine ratio 8 (L) 12 - 20      GFR est AA >60 >60 ml/min/1.6m2    GFR est non-AA >60 >60 ml/min/1.59m2    Calcium 7.8 (L) 8.5 - 10.1 MG/DL   MAGNESIUM    Collection Time: 05/06/16  3:00 AM   Result Value Ref Range    Magnesium 1.9 1.6 - 2.6 mg/dL             Telemetry:normal sinus rhythm    Imaging:  I have personally reviewed the patient???s radiographs and have reviewed the reports:     High: Phenergan [Promethazine]    ?? Unspecified: Gluten;  Voltaren [Diclofenac Sodium]   ??   Result Information    ?? Status Provider Status      ?? Final result (Exam End: 05/02/2016 10:37 AM) Open    ??   Study Result    ?? EXAM: CT Chest   ??  INDICATION: Cough, elevated white blood cell count..  ??  COMPARISON: Chest radiograph 05/02/2016.Marland Kitchen  ??  TECHNIQUE: Axial CT imaging from the thoracic inlet through the diaphragm after  intravenous contrast. Multiplanar reformations were generated. One or more dose  reduction techniques were used on this CT: automated exposure control,  adjustment of the mAs and/or kVp according to patient's size, and iterative  reconstruction techniques. The specific techniques utilized on this CT  exam have  been documented in the patient's electronic medical record.  ??  _______________  ??   FINDINGS:  ??  LUNGS: In keeping with radiographic findings, there is a large area of diffusely  abnormal opacity present within the superior segment of the right lower lobe,  with internal features of hypoattenuation and small soft tissue gas formation  (series 3, image 98). There is adjacent groundglass opacity, which is mild in  nature. This process respects the adjacent fissural surfaces. Overall  measurements are somewhat difficult given the oblong nature of this opacity,  with gross measurements of approximately 8.8 x 6.2 x 9.4 cm in size.  ??  Linear areas of atelectatic opacity are present at the dependent portions of  each lung base, as well as in the right upper and right middle lobes. No  additional site of focal pneumonic consolidation or suspicious pulmonary  nodule/mass.  ??  PLEURA: No pneumothorax or pleural effusion.  ??  AIRWAY: Central airways are patent.  ??  MEDIASTINUM: Diffuse atherosclerotic calcification of the thoracic aorta is  present without evidence of aneurysmal dilatation. Cardiac size is normal. There  is no pericardial effusion. Small amount of fluid present within the superior  pericardial recess.  ??  LYMPH NODES: No supra clavicular or axillary adenopathy. Increased number of  mediastinal lymph nodes are noted, with enlarged subcarinal lymph node measuring  approximately 1.4 x 2.2 cm in size.  ??  UPPER ABDOMEN: Unremarkable.  ??  OSSEOUS STRUCTURES: Unremarkable. No suspicious or blastic osseous lesions.  ??  OTHER:  ??  _______________  ??  IMPRESSION  IMPRESSION:  ??  ??  1. Abnormal radiographic opacity corresponds to a large area of abnormal  pulmonary parenchymal opacity with focal areas of hypoattenuation soft tissue  gas present within the superior segment of the right lower lobe, without  crossing the adjacent right major fissure. Overall imaging appearance is most  suggestive of a abscess forming pulmonary infectious process, with underlying   malignancy difficult to exclude on the basis of current imaging. No associated  pleural effusion is present. The adjacent subpleural fat, and lateral right ribs  are normal in appearance. Recommend cross-sectional imaging follow-up to  document expected resolution following treatment.  ??  2. Increased number of mediastinal lymph nodes, with likely reactive subcarinal  adenopathy.   ??              Total critical care time exclusive of procedures:    Alvera NovelEdward S Kollin Udell, MD  05/06/2016, 12:51 PM    MD Assessment prior to bronchoscopy  ASA 2  MP  2

## 2016-05-06 NOTE — Other (Signed)
Bedside and Verbal shift change report given to Denise Quamhahn Allen, RN (oncoming nurse) by B.Thomasena Edisollins, RN Physiological scientist(offgoing nurse). Report included the following information SBAR, Kardex and MAR     Alarm parameters reviewed, on and audible Appropriate for patient clinical condition

## 2016-05-06 NOTE — Progress Notes (Addendum)
2115: Assessment completed and documented. Patient resting in bed, denies any pain, noted non-productive couch. PICC line to right upper arm clean, dry and intact. Patient given shower cap per request.     2330: patient resting in bed, no distress.     0413: patient c/o mild nausea, stated possibly from not eating well. Patient given Ensure per request.

## 2016-05-06 NOTE — Progress Notes (Signed)
Hospitalist Progress Note-critical care note     Patient: Denise [name] MRN: [id number]  CSN: [id number]    Date of Birth: 1951/10/05  Age: 65 y.o.  Sex: female    DOA: 05/02/2016 LOS:  LOS: 4 days            Chief complaint: lung abscess, hypokalemia     Assessment/Plan         Patient Active Problem List   Diagnosis Code   ??? Lung abscess without pneumonia (HCC) J85.2   ??? COPD (chronic obstructive pulmonary disease) (HCC) J44.9   ??? Weight loss R63.4   ??? Celiac disease K90.0   ??? Hypokalemia E87.6     1. Lung abscess vs malignant   -continue improving.   -ID and  Pulm on board  -will continue IV Zosyn and Vanc-switch to  Ertapenem   -Hx of coccidiomycosis in 1983;   -hiv negative,   -picc line placed   -pulm on board-bronch yesterday, cx and cytology still pending.      2. Tremor   Continue inderal    3. Celiac disease  -gluten free diet    4. Weight loss  appetite improving  Adenopathy on ct chest      5. COPD with long hx of smoking:   -quitted smoking 2 wks ago   stable  Duo neb prn    6 hypokalemia   Resolved       Subjective: feel fine     Nurse : no acute issue      Can be d/c till cytology report   Review of systems:    General: No fevers . No chills , less fatigue   Cardiovascular: No chest pain or pressure. No palpitations.   Pulmonary: No shortness of breath.   Gastrointestinal: No nausea, vomiting.     Vital signs/Intake and Output:  Visit Vitals   ??? BP 115/80 (BP 1 Location: Left arm, BP Patient Position: At rest)   ??? Pulse 80   ??? Temp 97.4 ??F (36.3 ??C)   ??? Resp 20   ??? Ht 5' 4.5" (1.638 m)   ??? Wt 66 kg (145 lb 8 oz)   ??? SpO2 98%   ??? BMI 24.59 kg/m2     Current Shift:     Last three shifts:  05/31 1901 - 06/02 0700  In: 649.7 [P.O.:60; I.V.:589.7]  Out: 101 [Urine:101]    Physical Exam:  General: WD, WN.  Alert, cooperative, no acute distress????  HEENT: NC, Atraumatic.  PERRLA, anicteric sclerae.  Lungs: Clear bilateral , no wheezing   Heart:  Regular  rhythm,?? +murmur, No Rubs, No Gallops   Abdomen: Soft, Non distended, Non tender. ??+Bowel sounds,   Extremities: No c/c/e  Psych:???? Not anxious or agitated.  Neurologic:?? No acute neurological deficit.             Labs: Results:       Chemistry Recent Labs      05/06/16   0300  05/05/16   0500  05/04/16   0546   GLU  82  84  105*   NA  140  140  138   K  3.6  3.4*  3.3*   CL  104  104  105   CO2  28  28  24    BUN  5*  4*  4*   CREA  0.61  0.58*  0.62   CA  7.8*  7.9*  7.7*   AGAP  8  8  9   BUCR  8*  7*  6*      CBC w/Diff Recent Labs      05/06/16   0300  05/05/16   0500  05/04/16   0546   WBC  8.9  9.6  10.9   RBC  3.32*  3.37*  3.42*   HGB  10.4*  10.6*  10.9*   HCT  30.5*  31.0*  31.6*   PLT  295  280  269   GRANS  64  69  73   LYMPH  25  21  16*   EOS  Cardiac Enzymes No results for input(s): CPK, CKND1, MYO in the last 72 hours.    No lab exists for component: CKRMB, TROIP   Coagulation Recent Labs      05/05/16   0500   PTP  15.3*   INR  1.3*       Lipid Panel No results found for: CHOL, CHOLPOCT, CHOLX, CHLST, CHOLV, [id number], HDL, LDL, NLDLCT, DLDL, LDLC, DLDLP, [id number], VLDLC, VLDL, TGL, TGLX, TRIGL, [id number], TRIGP, TGLPOCT, [id number], CHHD, CHHDX   BNP No results for input(s): BNPP in the last 72 hours.   Liver Enzymes No results for input(s): TP, ALB, TBIL, AP, SGOT, GPT in the last 72 hours.    No lab exists for component: DBIL   Thyroid Studies No results found for: T4, T3U, TSH, TSHEXT, TSHEXT     Procedures/imaging: see electronic medical records for all procedures/Xrays and details which were not copied into this note but were reviewed prior to creation of Plan        Wendi Snipes, MD

## 2016-05-06 NOTE — Progress Notes (Addendum)
0056-Resting in bed, eyes closed, arouses easily. Stable.  Right upper arm double lumen PICC in place; patent, infusing.  Denies pain at this time.  Assessment complete.  Call light and telephone within reach.    0334-Ambulated to bathroom and back to bed.  No complaints.    0509-No change from initial assessment.  Stable.    0654-Resting in bed, eyes closed, aroused easily.  No complaints.    Shift Summary:  Uneventful shift.  Rested quietly throughout shift.

## 2016-05-07 LAB — CBC WITH AUTOMATED DIFF
ABS. BASOPHILS: 0.1 10*3/uL — ABNORMAL HIGH (ref 0.0–0.06)
ABS. EOSINOPHILS: 0.2 10*3/uL (ref 0.0–0.4)
ABS. LYMPHOCYTES: 2.2 10*3/uL (ref 0.9–3.6)
ABS. MONOCYTES: 0.8 10*3/uL (ref 0.05–1.2)
ABS. NEUTROPHILS: 5.5 10*3/uL (ref 1.8–8.0)
BASOPHILS: 1 % (ref 0–2)
EOSINOPHILS: 3 % (ref 0–5)
HCT: 32.4 % — ABNORMAL LOW (ref 35.0–45.0)
HGB: 11.2 g/dL — ABNORMAL LOW (ref 12.0–16.0)
LYMPHOCYTES: 25 % (ref 21–52)
MCH: 31.6 PG (ref 24.0–34.0)
MCHC: 34.6 g/dL (ref 31.0–37.0)
MCV: 91.5 FL (ref 74.0–97.0)
MONOCYTES: 9 % (ref 3–10)
MPV: 9.5 FL (ref 9.2–11.8)
NEUTROPHILS: 62 % (ref 40–73)
PLATELET: 298 10*3/uL (ref 135–420)
RBC: 3.54 M/uL — ABNORMAL LOW (ref 4.20–5.30)
RDW: 12.9 % (ref 11.6–14.5)
WBC: 8.8 10*3/uL (ref 4.6–13.2)

## 2016-05-07 LAB — METABOLIC PANEL, BASIC
Anion gap: 9 mmol/L (ref 3.0–18)
BUN/Creatinine ratio: 7 — ABNORMAL LOW (ref 12–20)
BUN: 4 MG/DL — ABNORMAL LOW (ref 7.0–18)
CO2: 29 mmol/L (ref 21–32)
Calcium: 8.2 MG/DL — ABNORMAL LOW (ref 8.5–10.1)
Chloride: 103 mmol/L (ref 100–108)
Creatinine: 0.59 MG/DL — ABNORMAL LOW (ref 0.6–1.3)
GFR est AA: >60 mL/min/{1.73_m2} (ref >60)
GFR est non-AA: >60 mL/min/{1.73_m2} (ref >60)
Glucose: 94 mg/dL (ref 74–99)
Potassium: 3.7 mmol/L (ref 3.5–5.5)
Sodium: 141 mmol/L (ref 136–145)

## 2016-05-07 LAB — CULTURE, RESPIRATORY/SPUTUM/BRONCH W GRAM STAIN: GRAM STAIN: NONE SEEN

## 2016-05-07 LAB — MAGNESIUM: Magnesium: 1.8 mg/dL (ref 1.6–2.6)

## 2016-05-07 MED ORDER — ERTAPENEM 1 GRAM SOLUTION FOR INJECTION
1 gram | INTRAMUSCULAR | 0 refills | Status: AC
Start: 2016-05-07 — End: 2016-06-14

## 2016-05-07 MED FILL — HEPARIN (PORCINE) 5,000 UNIT/ML IJ SOLN: 5000 unit/mL | INTRAMUSCULAR | Qty: 1

## 2016-05-07 MED FILL — INVANZ 1 GRAM SOLUTION FOR INJECTION: 1 gram | INTRAMUSCULAR | Qty: 1

## 2016-05-07 MED FILL — PROPRANOLOL 20 MG TAB: 20 mg | ORAL | Qty: 1

## 2016-05-07 NOTE — Progress Notes (Signed)
[facility]  Pulmonary, Critical Care, and Sleep Medicine    Name: Denise [name] MRN: [id number]   DOB: Apr 27, 1951 Hospital: [facility]   Date: 05/07/2016            Requesting MD:                                                  Reason for CC Consult:  IMPRESSION:   ?? Lung abscess ,   ?? Nicotine dependence, quit 2 weeks ago  ?? GERD      RECOMMENDATIONS:   ?? Continue IV abx  ?? Cultures and cytology pending  ?? Out of bed  ?? Pt/Ot  ?? acapella/aerobika  ?? Full code  ?? Repeat CT scan in 6 weeks.     Subjective/History:     This patient has been seen and evaluated at the request of Dr.Hutchison for lung lesion.  Patient is a 65 y.o. female with 95yr smoking hx. 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 Also reports anorexia and weight loss.  No hx of hemoptysis. Denies night sweats.  No fever.  Denies diarrhea    I have reviewed the CT of the chest, it suggest of large area of opacity with varing density  Suggestive of infectious process.       6/2  Tolerated the bronchoscopy yesterday.  Evidence of purulent secretions for basilar segments.  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     6/3  No evidence of malgnant cells. From bronch.  No issues overnight  couging less.   Ready for discharge discussed about the need for repeat ct scan.  Pt will continue abx as recommended by ID     Past Medical History:   Diagnosis Date   ??? Gastrointestinal disorder    ??? GERD (gastroesophageal reflux disease)    ??? Hypertension       Past Surgical History:   Procedure Laterality Date   ??? HX GYN        Prior to Admission medications    Medication Sig Start Date End Date Taking? Authorizing Provider   ertapenem 1 gram 1 g IVPB 1 g by IntraVENous route every twenty-four (24) hours for 38 days. 05/07/16 06/14/16 Yes Gar Ponto, MD    propranolol (INDERAL) 10 mg tablet Take 10 mg by mouth three (3) times daily.   Yes Phys Other, MD   naproxen sodium (ALEVE) 220 mg cap Take  by mouth.   Yes Phys Other, MD   pseudoephedrine (SUDAFED) 30 mg tablet Take  by mouth every four (4) hours as needed.   Yes Phys Other, MD   omeprazole (PRILOSEC) 20 mg capsule Take 1 Cap by mouth two (2) times a day. 03/27/12  Yes Hetty Blend, MD     Current Facility-Administered Medications   Medication Dose Route Frequency   ??? ertapenem (INVANZ) 1 g in 0.9% sodium chloride (MBP/ADV) 50 mL MBP  1 g IntraVENous Q24H   ??? heparin (porcine) injection 5,000 Units  5,000 Units SubCUTAneous Q8H   ??? propranolol (INDERAL) tablet 10 mg  10 mg Oral TID     Allergies   Allergen Reactions   ??? Phenergan [Promethazine] Anaphylaxis   ???  Gluten Other (comments)   ??? Voltaren [Diclofenac Sodium] Other (comments)     syncope      Social History   Substance Use Topics   ??? Smoking status: Former Smoker     Quit date: 04/19/2016   ??? Smokeless tobacco: Not on file   ??? Alcohol use Not on file      History reviewed. No pertinent family history.     Review of Systems:  A comprehensive review of systems was negative except for that written in the HPI.    Objective:   Vital Signs:    Visit Vitals   ??? BP 119/42   ??? Pulse 83   ??? Temp 99.2 ??F (37.3 ??C)   ??? Resp 18   ??? Ht 5' 4.5" (1.638 m)   ??? Wt 63.9 kg (140 lb 12.8 oz)   ??? SpO2 94%   ??? BMI 23.8 kg/m2       O2 Device: Room air   O2 Flow Rate (L/min): 2 l/min   Temp (24hrs), Avg:98.1 ??F (36.7 ??C), Min:97.4 ??F (36.3 ??C), Max:99.2 ??F (37.3 ??C)       Intake/Output:   Last shift:         Last 3 shifts: 06/01 1901 - 06/03 0700  In: 536.7 [P.O.:297; I.V.:239.7]  Out: 101 [Urine:101]    Intake/Output Summary (Last 24 hours) at 05/07/16 1103  Last data filed at 05/07/16 0608   Gross per 24 hour   Intake              237 ml   Output                0 ml   Net              237 ml     Hemodynamics:   .@MAP      .@CVP        Ventilator Settings:   Mode Rate Tidal Volume Pressure FiO2 PEEP                    Peak airway pressure:      Minute ventilation:        ARDS network Guidelines: Lung protective strategy and Pl pressure goals____________    Physical Exam:    General:  Alert, cooperative, no distress, appears stated age.   Head:  Normocephalic, without obvious abnormality, atraumatic.   Eyes:  Conjunctivae/corneas clear. PERRL, EOMs intact.   Nose: Nares normal. Septum midline. Mucosa normal. No drainage or sinus tenderness.   Throat: Lips, mucosa, and tongue normal. Teeth and gums normal.   Neck: Supple, symmetrical, trachea midline, no adenopathy, thyroid: no enlargment/tenderness/nodules, no carotid bruit and no JVD.   Back:   Symmetric, no curvature. ROM normal.   Lungs:   Clear to auscultation bilaterally.   Chest wall:  No tenderness or deformity.   Heart:  Regular rate and rhythm, S1, S2 normal, no murmur, click, rub or gallop.   Abdomen:   Soft, non-tender. Bowel sounds normal. No masses,  No organomegaly.   Extremities: Extremities normal, atraumatic, no cyanosis or edema.   Pulses: 2+ and symmetric all extremities.   Skin: Skin color, texture, turgor normal. No rashes or lesions   Lymph nodes: Cervical, supraclavicular, and axillary nodes normal.   Neurologic: Grossly nonfocal       Data:     Recent Results (from the past 24 hour(s))   MAGNESIUM    Collection Time: 05/07/16  4:10 AM   Result Value  Ref Range    Magnesium 1.8 1.6 - 2.6 mg/dL   CBC WITH AUTOMATED DIFF    Collection Time: 05/07/16  4:10 AM   Result Value Ref Range    WBC 8.8 4.6 - 13.2 K/uL    RBC 3.54 (L) 4.20 - 5.30 M/uL    HGB 11.2 (L) 12.0 - 16.0 g/dL    HCT 56.432.4 (L) 33.235.0 - 45.0 %    MCV 91.5 74.0 - 97.0 FL    MCH 31.6 24.0 - 34.0 PG    MCHC 34.6 31.0 - 37.0 g/dL    RDW 95.112.9 88.411.6 - 16.614.5 %    PLATELET 298 135 - 420 K/uL    MPV 9.5 9.2 - 11.8 FL    NEUTROPHILS 62 40 - 73 %    LYMPHOCYTES 25 21 - 52 %    MONOCYTES 9 3 - 10 %    EOSINOPHILS 3 0 - 5 %    BASOPHILS 1 0 - 2 %     ABS. NEUTROPHILS 5.5 1.8 - 8.0 K/UL    ABS. LYMPHOCYTES 2.2 0.9 - 3.6 K/UL    ABS. MONOCYTES 0.8 0.05 - 1.2 K/UL    ABS. EOSINOPHILS 0.2 0.0 - 0.4 K/UL    ABS. BASOPHILS 0.1 (H) 0.0 - 0.06 K/UL    DF AUTOMATED     METABOLIC PANEL, BASIC    Collection Time: 05/07/16  4:10 AM   Result Value Ref Range    Sodium 141 136 - 145 mmol/L    Potassium 3.7 3.5 - 5.5 mmol/L    Chloride 103 100 - 108 mmol/L    CO2 29 21 - 32 mmol/L    Anion gap 9 3.0 - 18 mmol/L    Glucose 94 74 - 99 mg/dL    BUN 4 (L) 7.0 - 18 MG/DL    Creatinine 0.630.59 (L) 0.6 - 1.3 MG/DL    BUN/Creatinine ratio 7 (L) 12 - 20      GFR est AA >60 >60 ml/min/1.2973m2    GFR est non-AA >60 >60 ml/min/1.273m2    Calcium 8.2 (L) 8.5 - 10.1 MG/DL             Telemetry:normal sinus rhythm    Imaging:  I have personally reviewed the patient???s radiographs and have reviewed the reports:     High: Phenergan [Promethazine]    ?? Unspecified: Gluten;  Voltaren [Diclofenac Sodium]   ??   Result Information    ?? Status Provider Status      ?? Final result (Exam End: 05/02/2016 10:37 AM) Open    ??   Study Result    ?? EXAM: CT Chest   ??  INDICATION: Cough, elevated white blood cell count..  ??  COMPARISON: Chest radiograph 05/02/2016.Marland Kitchen.  ??  TECHNIQUE: Axial CT imaging from the thoracic inlet through the diaphragm after  intravenous contrast. Multiplanar reformations were generated. One or more dose  reduction techniques were used on this CT: automated exposure control,  adjustment of the mAs and/or kVp according to patient's size, and iterative  reconstruction techniques. The specific techniques utilized on this CT exam have  been documented in the patient's electronic medical record.  ??  _______________  ??  FINDINGS:  ??  LUNGS: In keeping with radiographic findings, there is a large area of diffusely  abnormal opacity present within the superior segment of the right lower lobe,  with internal features of hypoattenuation and small soft tissue gas formation   (series 3, image 98).  There is adjacent groundglass opacity, which is mild in  nature. This process respects the adjacent fissural surfaces. Overall  measurements are somewhat difficult given the oblong nature of this opacity,  with gross measurements of approximately 8.8 x 6.2 x 9.4 cm in size.  ??  Linear areas of atelectatic opacity are present at the dependent portions of  each lung base, as well as in the right upper and right middle lobes. No  additional site of focal pneumonic consolidation or suspicious pulmonary  nodule/mass.  ??  PLEURA: No pneumothorax or pleural effusion.  ??  AIRWAY: Central airways are patent.  ??  MEDIASTINUM: Diffuse atherosclerotic calcification of the thoracic aorta is  present without evidence of aneurysmal dilatation. Cardiac size is normal. There  is no pericardial effusion. Small amount of fluid present within the superior  pericardial recess.  ??  LYMPH NODES: No supra clavicular or axillary adenopathy. Increased number of  mediastinal lymph nodes are noted, with enlarged subcarinal lymph node measuring  approximately 1.4 x 2.2 cm in size.  ??  UPPER ABDOMEN: Unremarkable.  ??  OSSEOUS STRUCTURES: Unremarkable. No suspicious or blastic osseous lesions.  ??  OTHER:  ??  _______________  ??  IMPRESSION  IMPRESSION:  ??  ??  1. Abnormal radiographic opacity corresponds to a large area of abnormal  pulmonary parenchymal opacity with focal areas of hypoattenuation soft tissue  gas present within the superior segment of the right lower lobe, without  crossing the adjacent right major fissure. Overall imaging appearance is most  suggestive of a abscess forming pulmonary infectious process, with underlying  malignancy difficult to exclude on the basis of current imaging. No associated  pleural effusion is present. The adjacent subpleural fat, and lateral right ribs  are normal in appearance. Recommend cross-sectional imaging follow-up to  document expected resolution following treatment.  ??   2. Increased number of mediastinal lymph nodes, with likely reactive subcarinal  adenopathy.   ??              Total critical care time exclusive of procedures:    Alvera Novel, MD  05/07/2016, 12:51 PM    MD Assessment prior to bronchoscopy  ASA 2  MP  2

## 2016-05-07 NOTE — Other (Signed)
Bedside and Verbal shift change report given to A. Marcha DuttonLeclair, RN (Cabin crewoncoming nurse) by Caroline MoreP. Grizotti, RN (offgoing nurse). Report included the following information SBAR, Kardex and MAR.

## 2016-05-07 NOTE — Discharge Summary (Signed)
[facility]  DISCHARGE SUMMARY    Name:  Denise [name], Denise [name]  MR#:  [id number]  DOB:  Apr 27, 1951  Account #:  [id number]  Date of Adm:  05/02/2016  Date of Discharge:      DIAGNOSES AT DISCHARGE  1. Right lower lobe lung abscess.  2. Chronic obstructive pulmonary disease.  3. History of extensive tobacco use.  4. Celiac sprue.  5. Gastroesophageal reflux disease.    CONSULTANTS IN THE HOSPITAL: Dr. Guy Sandifer, Infectious  Disease. Dr. Barbee Cough, Pulmonology, and the hospitalist division.    HOSPITAL SUMMARY: The patient is 65 years old. She is a lady with  a diagnosis of celiac sprue and an extensive tobacco history who  just quit 2 weeks ago when she was sick with continued cough and  production of phlegm. She had chills as well and no fevers, but  fatigued with anorexia and weight loss. She has a 38-year history of  smoking. She has a history of coccidioidomycosis of the lung in 1983  and had a surgical procedure then in [place]. She gets ocular  flashing and tremor for which she takes Inderal and she is on a PPI for  GERD. She had been treated with doxycycline from [facility], but  had no improvement of her respiratory symptoms, so she came into  the hospital. There was evidence for lung abscess. She was seen in  consultation by Infectious Disease, Pulmonology as noted, and started  on intravenous antibiotics, nebulizer treatments and has improved  overall and today is hopeful to go home as she is doing better. Her  appetite is still poor. She gets a salty taste when she eats anything so  she is focused on doing Ensures right now, but her blood pressure is  stable at 119/42, pulse 83, temperature 99.2, respiratory rate is 18 and  she is saturating 94% on room air. Her lungs are fairly clear bilaterally.  Cardiac exam within normal limits. She is mentating appropriately. She  is alert and oriented. There is no leg swelling. Her abdomen is benign.   Throughout this hospital stay here, she has had a CT scan that was  done on 05/02/2016, again that showed the area in the right lower lobe  suggestive of an abscess. There was concern about possible  malignancy. She had an HIV test that was negative. C-reactive protein  came back elevated at 202. There was cytology done via  bronchoscopy that showed no evidence for malignant cells. Also, there  was acute inflammation and this was a lavage that was satisfactory for  evaluation. Her last chemistry was unremarkable. Magnesium 1.8. Her  urinalysis on admission was unremarkable. Her last CBC, hemoglobin  and hematocrit 11.2 and 32.4. Her white count has been normal,  platelet count is normal as well, and she is not a diabetic. There were  no fungal elements on a fungal smear. AFB was sent out. Blood  culture from the 29th is no growth for 5 days. She had a respiratory  culture that grew few Candida albicans, otherwise normal respiratory  flora. Another one on the 05/05/2016 that showed normal respiratory  flora. Fungus culture: No fungal elements seen. Her last chest x-ray on  05/05/2016 showed no pneumothorax, status post bronchoscopy. She  has a right pleural effusion, parenchymal consolidation, possible  sequelae of the bronchioalveolar lavage. She has had a PICC line  placed and Infectious Disease has placed their instructions for  continued antibiotic therapy. They would like her to be  on IV antibiotics  through 06/14/2016.    PLAN: Discharge home today with arrangements for outpatient  infusion of ertapenem once daily until 06/14/2016 as instructed by  Infectious Disease with every Monday labs including a CBC, CMP and  PICC may be used to draw labs. The routine PICC care should occur  weekly with dressing changes. Can use Activase 2 mg per the lumen p.r.n.  and if there is no blood return. The instructions on ertapenem are 1  gram IV every 24 hours through the PICC through 06/14/2016, so for   38 more days. Once those arrangements are made, she can be  discharged from the hospital today. She is in stable condition to do so.  She needs to continue doing the Ensures for her nutrition. I would  recommend she be out of work at her job at [employer] for at least 1 month  as she states they will not accept her back with a PICC line in place,  and reassessment for further timing can be done from her outpatient  doctor. She sees Dr. Serita GrammesSeager. She is not sure about when she can get  back to see him, but the recommendation would be to follow up with  her PCP within 1 week. Also with Dr. Guy SandiferSensenig, Infectious Disease in 1  week, and the outpatient infusion center will be set up by the case  manager here to start tomorrow. She should be able to get her dose of  ertapenem today. Otherwise, she will continue her Inderal 10 mg 3  times daily, the intravenous antibiotic as needed. She has an incentive  spirometer and vibratory device to use to take home with her, and she  can continue her Prilosec 20 mg twice daily. She will need close  followup. She has been instructed on such, out of work for now,  Infectious Disease and the family doctor followup for her pulmonary  issue. She may need further evaluation through extensively further  bronchoscopy if she is not improving overall. Otherwise, she is stable  to leave from the hospital in the interim.    REPEAT CT CHEST IN 6 weeks-RX given and instructions to pt and chart    Chriss CzarUTH Eliezer Khawaja, MD    RI / Inés.DoorJN  D:  05/07/2016   09:11  T:  05/07/2016   09:37  Job #:  [id number]

## 2016-05-07 NOTE — Progress Notes (Signed)
Upon review of census, noted pt has been discharged and CM was not notified to assist.  Contacted [facility] and confirmed pt has not been seen at their clinic.  Obtained a 1pm appointment today for this pt.  Contacted pt and she is aware and has indicated she is able to come to this appointment and will follow up with ID regarding concerns with the length of need for IVABX..Denise [name]

## 2016-05-07 NOTE — Other (Signed)
Pt Denise [name], 65 yo female. Pt in bed during bedside report. Pt has no report of pain, N/V at this time.     Pt informed she will be discharged today. Pt will have [facility] to [facility] Pt is aware and provided with phone number. Pt informed she needs to follow up with her PCP within a week of discharge.   Pt discharge after daily antibiotic administration. Reviewed signs and symptoms of infection of PICC line, pt will be discharged with PICC.

## 2016-05-07 NOTE — Other (Signed)
EMR entered and reviewed by Professional Development Specialist for the purpose of chart review in the course of performing educational functions and responsibilities related to performance improvement.

## 2016-05-07 NOTE — Discharge Summary (Signed)
[facility]  DISCHARGE SUMMARY    Name:  [name], Denise  MR#:  [id number]  DOB:  02/10/1951  Account #:  [id number]  Date of Adm:  05/02/2016  Date of Discharge:      ADDENDUM    The patient is required to have a CT scan with and without contrast of  her chest in 6 weeks. I have given the patient a written prescription for  a CT scan to be done around the date of 06/18/2016 regarding  followup of her lung abscess. She is to call [facility] to set the  scan up or her primary doctor can set it up for her, but nonetheless,  she has been given specific instructions to have a repeat CT scan in 6  weeks and she has a prescription written in hand to get that test  scheduled.        Zamani Crocker, MD    RI / JN  D:  05/07/2016   10:53  T:  05/07/2016   14:45  Job #:  [id number]

## 2016-05-07 NOTE — Other (Signed)
Dual AVS reviewed with J. Schneck, RN.  All medications reviewed individually with patient.  Opportunities for questions and concerns provided.  Patient discharged via (mode of transport ie. Car, ambulance or air transport) car  Patient's arm band appropriately discarded.

## 2016-05-07 NOTE — Discharge Summary (Signed)
[facility]  DISCHARGE SUMMARY    Name:  Denise [name], Denise [name]  MR#:  [id number]  DOB:  [birth year]  Account #:  [id number]  Date of Adm:  05/02/2016  Date of Discharge:      ADDENDUM    The patient is required to have a CT scan with and without contrast of  her chest in 6 weeks. I have given the patient a written prescription for  a CT scan to be done around the date of 06/18/2016 regarding  followup of her lung abscess. [facility]  scan up or her primary doctor can set it up for her, but nonetheless,  she has been given specific instructions to have a repeat CT scan in 6  weeks and she has a prescription written in hand to get that test  scheduled.        Chriss CzarUTH Reyonna Haack, MD    RI / Inés.DoorJN  D:  05/07/2016   10:53  T:  05/07/2016   14:45  Job #:  [id number]

## 2016-05-07 NOTE — Discharge Summary (Signed)
[place] [facility]  DISCHARGE SUMMARY    Name:  Denise [name], Denise [name]  MR#:  [id number]  DOB:  1951-04-09  Account #:  [id number]  Date of Adm:  05/02/2016  Date of Discharge:      DIAGNOSES AT DISCHARGE  1. Right lower lobe lung abscess.  2. Chronic obstructive pulmonary disease.  3. History of extensive tobacco use.  4. Celiac sprue.  5. Gastroesophageal reflux disease.    CONSULTANTS IN THE HOSPITAL: Dr. Guy SandiferSensenig, Infectious  Disease. Dr. Barbee CoughPak, Pulmonology, and the hospitalist division.    HOSPITAL SUMMARY: The patient is 65 years old. She is a lady with  a diagnosis of celiac sprue and an extensive tobacco history who  just quit 2 weeks ago when she was sick with continued cough and  production of phlegm. She had chills as well and no fevers, but  fatigued with anorexia and weight loss. She has a 38-year history of  smoking. She has a history of coccidioidomycosis of the lung in 1983  and [place]. She gets ocular  flashing and tremor for which she takes Inderal and she is on a PPI for  GERD. She had been treated with doxycycline from [facility], but  had no improvement of her respiratory symptoms, so she came into  the hospital. There was evidence for lung abscess. She was seen in  consultation by Infectious Disease, Pulmonology as noted, and started  on intravenous antibiotics, nebulizer treatments and has improved  overall and today is hopeful to go home as she is doing better. Her  appetite is still poor. She gets a salty taste when she eats anything so  she is focused on doing Ensures right now, but her blood pressure is  stable at 119/42, pulse 83, temperature 99.2, respiratory rate is 18 and  she is saturating 94% on room air. Her lungs are fairly clear bilaterally.  Cardiac exam within normal limits. She is mentating appropriately. She  is alert and oriented. There is no leg swelling. Her abdomen is benign.  Throughout this hospital stay here, she has had  a CT scan that was  done on 05/02/2016, again that showed the area in the right lower lobe  suggestive of an abscess. There was concern about possible  malignancy. She had an HIV test that was negative. C-reactive protein  came back elevated at 202. There was cytology done via  bronchoscopy that showed no evidence for malignant cells. Also, there  was acute inflammation and this was a lavage that was satisfactory for  evaluation. Her last chemistry was unremarkable. Magnesium 1.8. Her  urinalysis on admission was unremarkable. Her last CBC, hemoglobin  and hematocrit 11.2 and 32.4. Her white count has been normal,  platelet count is normal as well, and she is not a diabetic. There were  no fungal elements on a fungal smear. AFB was sent out. Blood  culture from the 29th is no growth for 5 days. She had a respiratory  culture that grew few Candida albicans, otherwise normal respiratory  flora. Another one on the 05/05/2016 that showed normal respiratory  flora. Fungus culture: No fungal elements seen. Her last chest x-ray on  05/05/2016 showed no pneumothorax, status post bronchoscopy. She  has a right pleural effusion, parenchymal consolidation, possible  sequelae of the bronchioalveolar lavage. She has had a PICC line  placed and Infectious Disease has placed their instructions for  continued antibiotic therapy. They would like her to be  on IV antibiotics  through 06/14/2016.    PLAN: Discharge home today with arrangements for outpatient  infusion of ertapenem once daily until 06/14/2016 as instructed by  Infectious Disease with every Monday labs including a CBC, CMP and  PICC may be used to draw labs. The routine PICC care should occur  weekly with dressing changes. Can use Activase 2 mg per the lumen p.r.n.  and if there is no blood return. The instructions on ertapenem are 1  gram IV every 24 hours through the PICC through 06/14/2016, so for  38 more days. Once those arrangements are made, she can be  discharged  from the hospital today. She is in stable condition to do so.  She needs to continue doing the Ensures for her nutrition. I would  recommend she be out of work at her job at [employer] for at least 1 month  as she states they will not accept her back with a PICC line in place,  and reassessment for further timing can be done from her outpatient  doctor. She sees Dr. Serita Grammes. She is not sure about when she can get  back to see him, but the recommendation would be to follow up with  her PCP within 1 week. Also with Dr. Guy Sandifer, Infectious Disease in 1  week, and the outpatient infusion center will be set up by the case  manager here to start tomorrow. She should be able to get her dose of  ertapenem today. Otherwise, she will continue her Inderal 10 mg 3  times daily, the intravenous antibiotic as needed. She has an incentive  spirometer and vibratory device to use to take home with her, and she  can continue her Prilosec 20 mg twice daily. She will need close  followup. She has been instructed on such, out of work for now,  Infectious Disease and the family doctor followup for her pulmonary  issue. She may need further evaluation through extensively further  bronchoscopy if she is not improving overall. Otherwise, she is stable  to leave from the hospital in the interim.    REPEAT CT CHEST IN 6 weeks-RX given and instructions to pt and chart    Chriss Czar, MD    [place] / Inés.Door  D:  05/07/2016   09:11  T:  05/07/2016   09:37  Job #:  [id number]

## 2016-05-08 LAB — CULTURE, BLOOD: Culture result:: NO GROWTH

## 2016-05-09 ENCOUNTER — Inpatient Hospital Stay: Admit: 2016-05-09 | Payer: Self-pay | Primary: Family Medicine

## 2016-05-09 DIAGNOSIS — J851 Abscess of lung with pneumonia: Secondary | ICD-10-CM

## 2016-05-09 LAB — CBC WITH AUTOMATED DIFF
ABS. BASOPHILS: 0 10*3/uL (ref 0.0–0.06)
ABS. EOSINOPHILS: 0.2 10*3/uL (ref 0.0–0.4)
ABS. LYMPHOCYTES: 1.9 10*3/uL (ref 0.9–3.6)
ABS. MONOCYTES: 0.6 10*3/uL (ref 0.05–1.2)
ABS. NEUTROPHILS: 5.2 10*3/uL (ref 1.8–8.0)
BASOPHILS: 1 % (ref 0–2)
EOSINOPHILS: 3 % (ref 0–5)
HCT: 35.5 % (ref 35.0–45.0)
HGB: 12 g/dL (ref 12.0–16.0)
LYMPHOCYTES: 24 % (ref 21–52)
MCH: 31.6 PG (ref 24.0–34.0)
MCHC: 33.8 g/dL (ref 31.0–37.0)
MCV: 93.4 FL (ref 74.0–97.0)
MONOCYTES: 8 % (ref 3–10)
MPV: 10 FL (ref 9.2–11.8)
NEUTROPHILS: 64 % (ref 40–73)
PLATELET: 376 10*3/uL (ref 135–420)
RBC: 3.8 M/uL — ABNORMAL LOW (ref 4.20–5.30)
RDW: 13.5 % (ref 11.6–14.5)
WBC: 8 10*3/uL (ref 4.6–13.2)

## 2016-05-09 LAB — METABOLIC PANEL, COMPREHENSIVE
A-G Ratio: 0.4 — ABNORMAL LOW (ref 0.8–1.7)
ALT (SGPT): 43 U/L (ref 13–56)
AST (SGOT): 39 U/L — ABNORMAL HIGH (ref 15–37)
Albumin: 2.2 g/dL — ABNORMAL LOW (ref 3.4–5.0)
Alk. phosphatase: 79 U/L (ref 45–117)
Anion gap: 9 mmol/L (ref 3.0–18)
BUN/Creatinine ratio: 14 (ref 12–20)
BUN: 10 MG/DL (ref 7.0–18)
Bilirubin, total: 0.2 MG/DL (ref 0.2–1.0)
CO2: 29 mmol/L (ref 21–32)
Calcium: 8.6 MG/DL (ref 8.5–10.1)
Chloride: 104 mmol/L (ref 100–108)
Creatinine: 0.73 MG/DL (ref 0.6–1.3)
GFR est AA: >60 mL/min/{1.73_m2} (ref >60)
GFR est non-AA: >60 mL/min/{1.73_m2} (ref >60)
Globulin: 5.2 g/dL — ABNORMAL HIGH (ref 2.0–4.0)
Glucose: 99 mg/dL (ref 74–99)
Potassium: 3.8 mmol/L (ref 3.5–5.5)
Protein, total: 7.4 g/dL (ref 6.4–8.2)
Sodium: 142 mmol/L (ref 136–145)

## 2016-05-09 MED ORDER — SODIUM CHLORIDE 0.9 % IV PIGGY BACK
1 gram | Freq: Once | INTRAVENOUS | Status: DC
Start: 2016-05-09 — End: 2016-05-09

## 2016-05-09 MED ORDER — SODIUM CHLORIDE 0.9 % IV PIGGY BACK
1 gram | Freq: Once | INTRAVENOUS | Status: AC
Start: 2016-05-09 — End: 2016-05-09
  Administered 2016-05-09: 18:00:00 via INTRAVENOUS

## 2016-05-09 MED ORDER — HEPARIN, PORCINE (PF) 100 UNIT/ML IV SYRINGE
100 unit/mL | Freq: Once | INTRAVENOUS | Status: AC
Start: 2016-05-09 — End: 2016-05-09
  Administered 2016-05-09: 18:00:00

## 2016-05-09 MED ORDER — SODIUM CHLORIDE 0.9 % IJ SYRG
INTRAMUSCULAR | Status: DC | PRN
Start: 2016-05-09 — End: 2016-05-13
  Administered 2016-05-09 (×2): via INTRAVENOUS

## 2016-05-09 MED FILL — INVANZ 1 GRAM SOLUTION FOR INJECTION: 1 gram | INTRAMUSCULAR | Qty: 1

## 2016-05-09 MED FILL — BD POSIFLUSH NORMAL SALINE 0.9 % INJECTION SYRINGE: INTRAMUSCULAR | Qty: 40

## 2016-05-09 MED FILL — HEPARIN LOCK FLUSH (PORCINE) (PF) 100 UNIT/ML INTRAVENOUS SYRINGE: 100 unit/mL | INTRAVENOUS | Qty: 5

## 2016-05-09 NOTE — Progress Notes (Signed)
[facility] Progress Note    Date: May 09, 2016    Name: Denise [name]    MRN: [id number]         DOB: [birth year]    [facility]    Denise [name] to [place], ambulatory at 1320. Pt was assessed and education was provided.   Patient reports she was [name] from [facility] on Saturday after being treated for a lung abscess following a period of decreased appetite and generally feeling unwell. She states she is still having some nausea, especially after eating, but that she is keeping more down and is drinking "muscle milk" for more protein.     She has a red spotted rash to her mid-sternal area, abdomen and back (raised on mid-sternum and flat on abdomen). Patient states she had the rash while in the hospital and that she told her MD's about it. She states she is fairly certain she had it before she began [place].     Patient received four doses of Invanz while in the hospital without any signs/symptoms of allergic reaction. Patient given written Care Notes about Invanz.     Denise [name] vitals were reviewed.  Visit Vitals   ??? BP 135/78 (BP 1 Location: Left arm, BP Patient Position: Sitting)   ??? Pulse 83   ??? Temp 99.2 ??F (37.3 ??C)   ??? Resp 16   ??? Ht 5' 5" (1.651 m)   ??? Wt 61.2 kg (135 lb)   ??? SpO2 95%   ??? Breastfeeding No   ??? BMI 22.47 kg/m2       Right upper arm PICC flushed easily and had brisk blood return via both ports. Blood drawn off and sent to lab for CBC and CMP after 10 ml waste per written orders.     Recent Results (from the past 12 hour(s))   CBC WITH AUTOMATED DIFF    Collection Time: 05/09/16  1:55 PM   Result Value Ref Range    WBC 8.0 4.6 - 13.2 K/uL    RBC 3.80 (L) 4.20 - 5.30 M/uL    HGB 12.0 12.0 - 16.0 g/dL    HCT 35.5 35.0 - 45.0 %    MCV 93.4 74.0 - 97.0 FL    MCH 31.6 24.0 - 34.0 PG    MCHC 33.8 31.0 - 37.0 g/dL    RDW 13.5 11.6 - 14.5 %    PLATELET 376 135 - 420 K/uL    MPV 10.0 9.2 - 11.8 FL    NEUTROPHILS 64 40 - 73 %    LYMPHOCYTES 24 21 - 52 %    MONOCYTES 8 3 - 10 %    EOSINOPHILS 3 0 - 5 %     BASOPHILS 1 0 - 2 %    ABS. NEUTROPHILS 5.2 1.8 - 8.0 K/UL    ABS. LYMPHOCYTES 1.9 0.9 - 3.6 K/UL    ABS. MONOCYTES 0.6 0.05 - 1.2 K/UL    ABS. EOSINOPHILS 0.2 0.0 - 0.4 K/UL    ABS. BASOPHILS 0.0 0.0 - 0.06 K/UL    DF AUTOMATED     METABOLIC PANEL, COMPREHENSIVE    Collection Time: 05/09/16  1:55 PM   Result Value Ref Range    Sodium 142 136 - 145 mmol/L    Potassium 3.8 3.5 - 5.5 mmol/L    Chloride 104 100 - 108 mmol/L    CO2 29 21 - 32 mmol/L    Anion gap 9 3.0 - 18 mmol/L    Glucose  99 74 - 99 mg/dL    BUN 10 7.0 - 18 MG/DL    Creatinine 0.73 0.6 - 1.3 MG/DL    BUN/Creatinine ratio 14 12 - 20      GFR est AA >60 >60 ml/min/1.44m    GFR est non-AA >60 >60 ml/min/1.72m   Calcium 8.6 8.5 - 10.1 MG/DL    Bilirubin, total PENDING MG/DL    ALT (SGPT) PENDING U/L    AST (SGOT) PENDING U/L    Alk. phosphatase PENDING U/L    Protein, total PENDING g/dL    Albumin PENDING g/dL    Globulin PENDING g/dL    A-G Ratio PENDING         PICC dressing c/d/i and not due to be changed. No swelling, redness, streaking, warmth or drainage noted in arm. Pt denied c/o pain in arm.        Vancomycin       Invanz 1 gm       Cubicin       Rocephin    was infused at 100 ml/hr (over approximately 30 minutes).    Denise [name] infusion, and had no complaints at this time.    Both lumens of PICC flushed with NS 10 ml and Heparin 250 units. Lumens wrapped with guaze and paper tape. Stockinette placed over dressing for protection.    Patient armband removed and shredded.    Denise [name] from [place] stable condition at 1430. She is to return on 05/10/16 at 1330 for her next antibiotic appointment.    JeDamien FusiRN  May 09, 2016

## 2016-05-10 ENCOUNTER — Inpatient Hospital Stay: Admit: 2016-05-10 | Payer: Self-pay | Primary: Family Medicine

## 2016-05-10 MED ORDER — SODIUM CHLORIDE 0.9 % IJ SYRG
INTRAMUSCULAR | Status: DC | PRN
Start: 2016-05-10 — End: 2016-05-14
  Administered 2016-05-10 (×2): via INTRAVENOUS

## 2016-05-10 MED ORDER — ERTAPENEM 1 GRAM SOLUTION FOR INJECTION
1 gram | Freq: Once | INTRAMUSCULAR | Status: AC
Start: 2016-05-10 — End: 2016-05-11

## 2016-05-10 MED ORDER — HEPARIN, PORCINE (PF) 100 UNIT/ML IV SYRINGE
100 unit/mL | Freq: Once | INTRAVENOUS | Status: AC
Start: 2016-05-10 — End: 2016-05-10
  Administered 2016-05-10: 19:00:00

## 2016-05-10 MED ORDER — SODIUM CHLORIDE 0.9 % IJ SYRG
INTRAMUSCULAR | Status: DC | PRN
Start: 2016-05-10 — End: 2016-05-14

## 2016-05-10 MED ORDER — HEPARIN, PORCINE (PF) 100 UNIT/ML IV SYRINGE
100 unit/mL | Freq: Once | INTRAVENOUS | Status: AC
Start: 2016-05-10 — End: 2016-05-11

## 2016-05-10 MED ORDER — SODIUM CHLORIDE 0.9 % IV PIGGY BACK
1 gram | Freq: Once | INTRAVENOUS | Status: AC
Start: 2016-05-10 — End: 2016-05-11

## 2016-05-10 MED ORDER — SODIUM CHLORIDE 0.9 % IV PIGGY BACK
1 gram | Freq: Once | INTRAVENOUS | Status: AC
Start: 2016-05-10 — End: 2016-05-10
  Administered 2016-05-10: 18:00:00 via INTRAVENOUS

## 2016-05-10 MED FILL — BD POSIFLUSH NORMAL SALINE 0.9 % INJECTION SYRINGE: INTRAMUSCULAR | Qty: 40

## 2016-05-10 NOTE — Progress Notes (Signed)
[facility] Progress Note    Date: May 10, 2016    Name: Denise [name]    MRN: [id number]         DOB: 11/18/1951    [facility]    [facility] [place] [facility] [place] [place] [facility]. Pt was assessed and education was provided.   Patient states she feels more lethargic today. She also reports that last night while talking on the telephone with her cousin she felt more breathless. SpO2 = 97% on room air today and RR = 16. Instructed patient to call PCP or go to ED if SOB became worse.    [name] vitals were reviewed.  Visit Vitals   ??? BP 134/58 (BP 1 Location: Left arm, BP Patient Position: Sitting)   ??? Pulse 76   ??? Temp 98.5 ??F (36.9 ??C)   ??? Resp 16   ??? SpO2 97%       Right upper arm PICC flushed easily and had brisk blood return via both ports.     PICC dressing c/d/i and not due to be changed. No swelling, redness, streaking, warmth or drainage noted in arm. Pt denied c/o pain in arm.        Vancomycin       Invanz 1 gm       Cubicin       Rocephin    was infused at 100 ml/hr (over approximately 30 minutes).    [name] tolerated infusion, and had no complaints at this time.    Both lumens of PICC flushed with NS 10 ml and Heparin 250 units. Lumens wrapped with guaze and paper tape. Stockinette placed over dressing for protection.    Patient armband removed and shredded.    [name] in stable condition at 1435. She is to return on 05/11/16 at 1430 for her next antibiotic appointment.    Francoise CeoJennifer Akshith Moncus, RN  May 10, 2016

## 2016-05-11 ENCOUNTER — Inpatient Hospital Stay: Admit: 2016-05-11 | Payer: Self-pay | Primary: Family Medicine

## 2016-05-11 MED ORDER — HEPARIN, PORCINE (PF) 100 UNIT/ML IV SYRINGE
100 unit/mL | Freq: Once | INTRAVENOUS | Status: AC
Start: 2016-05-11 — End: 2016-05-11
  Administered 2016-05-11: 19:00:00

## 2016-05-11 MED ORDER — ERTAPENEM 1 GRAM SOLUTION FOR INJECTION
1 gram | Freq: Once | INTRAMUSCULAR | Status: AC
Start: 2016-05-11 — End: 2016-05-11
  Administered 2016-05-11: 19:00:00 via INTRAVENOUS

## 2016-05-11 MED ORDER — SODIUM CHLORIDE 0.9 % IJ SYRG
INTRAMUSCULAR | Status: DC | PRN
Start: 2016-05-11 — End: 2016-05-15
  Administered 2016-05-11: 19:00:00 via INTRAVENOUS

## 2016-05-11 MED FILL — HEPARIN LOCK FLUSH (PORCINE) (PF) 100 UNIT/ML INTRAVENOUS SYRINGE: 100 unit/mL | INTRAVENOUS | Qty: 5

## 2016-05-11 MED FILL — BD POSIFLUSH NORMAL SALINE 0.9 % INJECTION SYRINGE: INTRAMUSCULAR | Qty: 40

## 2016-05-11 MED FILL — INVANZ 1 GRAM SOLUTION FOR INJECTION: 1 gram | INTRAMUSCULAR | Qty: 1

## 2016-05-11 NOTE — Progress Notes (Signed)
[facility] Progress Note    Date: May 11, 2016    Name: Denise DickerJanet Jo [name]    MRN: [id number]         DOB: [birth year]    [facility]    [name] [facility] [place] at 1425. Pt was assessed and education was provided.  [name] reports feeling very tired and that she is having trouble eating because "everything tastes like salt".    [name] vitals were reviewed.  Visit Vitals   ??? BP 131/77 (BP 1 Location: Left arm, BP Patient Position: Sitting)   ??? Pulse 82   ??? Temp 97.5 ??F (36.4 ??C)   ??? Resp 16   ??? SpO2 97%       Right upper arm PICC flushed easily and had brisk blood return via both ports.             Vancomycin       Invanz 1 gram       Cubicin       Rocephin    was infused at 100 ml/hr (over approximately 30 minutes).    PICC dressing and stat lock changed per protocol. No redness, streaking, warmth, or drainage noted at site. Microclaves changed.     [name] tolerated infusion, and had no complaints at this time.    Both lumens of PICC flushed with NS 10 ml and Heparin 250 units. Lumens wrapped with guaze and paper tape. Stockinette placed over dressing for protection.    Patient armband removed and shredded.    [name] from [facility] in stable condition at 1530. She is to return on 05/12/16 at 1400 for her next antibiotic appointment.    [name], RN  May 11, 2016

## 2016-05-12 ENCOUNTER — Inpatient Hospital Stay: Admit: 2016-05-12 | Payer: Self-pay | Primary: Family Medicine

## 2016-05-12 MED ORDER — HEPARIN, PORCINE (PF) 100 UNIT/ML IV SYRINGE
100 unit/mL | Freq: Once | INTRAVENOUS | Status: AC
Start: 2016-05-12 — End: 2016-05-12
  Administered 2016-05-12: 19:00:00

## 2016-05-12 MED ORDER — ERTAPENEM 1 GRAM SOLUTION FOR INJECTION
1 gram | Freq: Once | INTRAMUSCULAR | Status: AC
Start: 2016-05-12 — End: 2016-05-12
  Administered 2016-05-12: 18:00:00 via INTRAVENOUS

## 2016-05-12 MED ORDER — SODIUM CHLORIDE 0.9 % IJ SYRG
INTRAMUSCULAR | Status: DC | PRN
Start: 2016-05-12 — End: 2016-05-16
  Administered 2016-05-12: 19:00:00 via INTRAVENOUS

## 2016-05-12 MED FILL — BD POSIFLUSH NORMAL SALINE 0.9 % INJECTION SYRINGE: INTRAMUSCULAR | Qty: 10

## 2016-05-12 MED FILL — INVANZ 1 GRAM SOLUTION FOR INJECTION: 1 gram | INTRAMUSCULAR | Qty: 1

## 2016-05-12 MED FILL — HEPARIN LOCK FLUSH (PORCINE) (PF) 100 UNIT/ML INTRAVENOUS SYRINGE: 100 unit/mL | INTRAVENOUS | Qty: 5

## 2016-05-12 NOTE — Progress Notes (Signed)
[facility] Progress Note    Date: May 12, 2016    Name: Denise DickerJanet Jo [name]    MRN: [id number]         DOB: 1951/03/20    Denise [name] was assessed and education was provided.     Denise [name]'s vitals were reviewed.  Visit Vitals   ??? BP 130/72 (BP 1 Location: Left arm, BP Patient Position: Sitting)   ??? Pulse 77   ??? Temp 98.7 ??F (37.1 ??C)   ??? Resp 16   ??? SpO2 94%       Lab results were obtained and reviewed.  No results found for this or any previous visit (from the past 12 hour(s)).          Vancomycin       Invanz 1 gram IV       Cubicin       Rocephin    was infused at  100 ml/hr.    Denise [name] tolerated infusion, and had no complaints at this time.  Patient armband removed and shredded.    Denise [name] was discharged [facility] in stable condition at 1445. She is to return on 05/13/16 for her next appointment.    Mare LoanLisa M Creighton-Bey, RN  May 12, 2016  2:23 PM

## 2016-05-13 ENCOUNTER — Inpatient Hospital Stay: Admit: 2016-05-13 | Payer: Self-pay | Primary: Family Medicine

## 2016-05-13 MED ORDER — SODIUM CHLORIDE 0.9 % IV PIGGY BACK
1 gram | Freq: Once | INTRAVENOUS | Status: AC
Start: 2016-05-13 — End: 2016-05-13
  Administered 2016-05-13: 18:00:00 via INTRAVENOUS

## 2016-05-13 MED ORDER — HEPARIN, PORCINE (PF) 100 UNIT/ML IV SYRINGE
100 unit/mL | INTRAVENOUS | Status: DC | PRN
Start: 2016-05-13 — End: 2016-05-17
  Administered 2016-05-13: 18:00:00

## 2016-05-13 MED ORDER — SODIUM CHLORIDE 0.9 % IJ SYRG
INTRAMUSCULAR | Status: DC | PRN
Start: 2016-05-13 — End: 2016-05-17
  Administered 2016-05-13 (×2): via INTRAVENOUS

## 2016-05-13 MED FILL — HEPARIN LOCK FLUSH (PORCINE) (PF) 100 UNIT/ML INTRAVENOUS SYRINGE: 100 unit/mL | INTRAVENOUS | Qty: 5

## 2016-05-13 MED FILL — INVANZ 1 GRAM SOLUTION FOR INJECTION: 1 gram | INTRAMUSCULAR | Qty: 1

## 2016-05-13 MED FILL — BD POSIFLUSH NORMAL SALINE 0.9 % INJECTION SYRINGE: INTRAMUSCULAR | Qty: 40

## 2016-05-13 NOTE — Progress Notes (Signed)
[facility]    Date: May 13, 2016    Name: Denise [name]    MRN: [id number]         DOB: 1951/01/26      [name] [employer]    [name] was assessed and education was provided.     [name] vitals were reviewed.  Visit Vitals   ??? BP 128/72 (BP 1 Location: Left arm, BP Patient Position: Sitting)   ??? Pulse 72   ??? Temp 98.4 ??F (36.9 ??C)   ??? Resp 16   ??? SpO2 96%       Pt with right upper arm double lumen PICC line.  Each lumen flushes without difficulty and positive for blood return.  Dressing CDI.  No redness, bruising, or swelling noted at site and/ or extremity.  Pt denies tenderness.    Invanz 1g was administered as ordered via purple lumen followed by normal saline flush.    [name] tolerated well without complaints.    Flushed each lumen of pt's PICC line with heparin per order.  Wrapped lumens w/ gauze & paper tape.    [name] from [facility] in stable condition at 1410.  She is to return on 05/14/16 at 0800 for her next appointment.    Tandy GawStacey R Lebo, RN  May 13, 2016

## 2016-05-14 ENCOUNTER — Inpatient Hospital Stay: Admit: 2016-05-14 | Payer: Self-pay | Primary: Family Medicine

## 2016-05-14 MED ORDER — SODIUM CHLORIDE 0.9 % IJ SYRG
INTRAMUSCULAR | Status: DC | PRN
Start: 2016-05-14 — End: 2016-05-18
  Administered 2016-05-14 (×2): via INTRAVENOUS

## 2016-05-14 MED ORDER — SODIUM CHLORIDE 0.9 % IV PIGGY BACK
1 gram | Freq: Once | INTRAVENOUS | Status: DC
Start: 2016-05-14 — End: 2016-05-14

## 2016-05-14 MED ORDER — HEPARIN, PORCINE (PF) 100 UNIT/ML IV SYRINGE
100 unit/mL | Freq: Once | INTRAVENOUS | Status: AC
Start: 2016-05-14 — End: 2016-05-14
  Administered 2016-05-14: 13:00:00

## 2016-05-14 MED ORDER — SODIUM CHLORIDE 0.9 % IV PIGGY BACK
1 gram | Freq: Once | INTRAVENOUS | Status: AC
Start: 2016-05-14 — End: 2016-05-14
  Administered 2016-05-14: 12:00:00 via INTRAVENOUS

## 2016-05-14 MED FILL — INVANZ 1 GRAM SOLUTION FOR INJECTION: 1 gram | INTRAMUSCULAR | Qty: 1

## 2016-05-14 MED FILL — HEPARIN LOCK FLUSH (PORCINE) (PF) 100 UNIT/ML INTRAVENOUS SYRINGE: 100 unit/mL | INTRAVENOUS | Qty: 5

## 2016-05-14 MED FILL — BD POSIFLUSH NORMAL SALINE 0.9 % INJECTION SYRINGE: INTRAMUSCULAR | Qty: 10

## 2016-05-14 NOTE — Progress Notes (Signed)
[facility]    Date: May 14, 2016    Name: Denise DickerJanet Jo [name]    MRN: [id number]         DOB: Apr 06, 1951      [name]    Denise [name] was assessed and education was provided.     Denise [name]'s vitals were reviewed.  Visit Vitals   ??? BP 147/74 (BP 1 Location: Left arm, BP Patient Position: Sitting)   ??? Pulse 72   ??? Temp 97.8 ??F (36.6 ??C)   ??? Resp 18   ??? SpO2 94%       Denise [name] with Right upper arm double lumen PICC line.  Each lumen flushes without difficulty and positive for blood return.  Dressing CDI.  No redness, bruising, or swelling noted at site and/ or extremity.  Denise [name] denies tenderness.    Invanz 1g was administered as ordered via purple lumen followed by normal saline flush.    Denise [name] tolerated well without complaints.    Flushed each lumen of Denise [name]'s PICC line with heparin per order.  Lumens wrapped in gauze and paper tape.    Denise [name] was discharged from [facility] in stable condition at 915.  She is to return on May 15, 2016 at 800 for her next appointment.    Laurice RecordElise M Williams, RN  May 14, 2016

## 2016-05-15 ENCOUNTER — Inpatient Hospital Stay: Admit: 2016-05-15 | Payer: Self-pay | Primary: Family Medicine

## 2016-05-15 MED ORDER — HEPARIN, PORCINE (PF) 100 UNIT/ML IV SYRINGE
100 unit/mL | Freq: Once | INTRAVENOUS | Status: AC
Start: 2016-05-15 — End: 2016-05-15
  Administered 2016-05-15: 13:00:00

## 2016-05-15 MED ORDER — ERTAPENEM 1 GRAM SOLUTION FOR INJECTION
1 gram | Freq: Once | INTRAMUSCULAR | Status: AC
Start: 2016-05-15 — End: 2016-05-15
  Administered 2016-05-15: 12:00:00 via INTRAVENOUS

## 2016-05-15 MED ORDER — SODIUM CHLORIDE 0.9 % IJ SYRG
INTRAMUSCULAR | Status: DC | PRN
Start: 2016-05-15 — End: 2016-05-19
  Administered 2016-05-15 (×2): via INTRAVENOUS

## 2016-05-15 MED FILL — BD POSIFLUSH NORMAL SALINE 0.9 % INJECTION SYRINGE: INTRAMUSCULAR | Qty: 10

## 2016-05-15 MED FILL — HEPARIN LOCK FLUSH (PORCINE) (PF) 100 UNIT/ML INTRAVENOUS SYRINGE: 100 unit/mL | INTRAVENOUS | Qty: 5

## 2016-05-15 MED FILL — INVANZ 1 GRAM SOLUTION FOR INJECTION: 1 gram | INTRAMUSCULAR | Qty: 1

## 2016-05-15 NOTE — Progress Notes (Addendum)
[facility]    Date: May 15, 2016    Name: Denise [name]    MRN: [id number]         DOB: [birth year]      [name]    Denise [name] was assessed and education was provided.     Denise [name]'s vitals were reviewed.  Visit Vitals   ??? BP (P) 127/65 (BP 1 Location: Left arm, BP Patient Position: Sitting)   ??? Pulse (P) 74   ??? Temp (P) 98.1 ??F (36.7 ??C)   ??? Resp (P) 18   ??? SpO2 95%       Pt with Right upper arm double lumen PICC line.  Each lumen flushes without difficulty and positive for blood return.  Dressing CDI.  No redness, bruising, or swelling noted at site and/ or extremity.  Pt denies tenderness.    Invanz 1 g was administered as ordered via purple lumen followed by normal saline flush.    Denise [name] tolerated well without complaints.    Flushed each lumen of pt's PICC line with heparin per order.  Wrapped lumens in gauze and paper tape.    Denise [name] was discharged from [facility] in stable condition at 905.  She is to return on May 16, 2016 at 1330 for her next appointment.    Laurice RecordElise M Williams, RN  May 15, 2016

## 2016-05-16 ENCOUNTER — Inpatient Hospital Stay: Admit: 2016-05-16 | Payer: Self-pay | Primary: Family Medicine

## 2016-05-16 LAB — METABOLIC PANEL, COMPREHENSIVE
A-G Ratio: 0.5 — ABNORMAL LOW (ref 0.8–1.7)
ALT (SGPT): 15 U/L (ref 13–56)
AST (SGOT): 19 U/L (ref 15–37)
Albumin: 2.6 g/dL — ABNORMAL LOW (ref 3.4–5.0)
Alk. phosphatase: 84 U/L (ref 45–117)
Anion gap: 7 mmol/L (ref 3.0–18)
BUN/Creatinine ratio: 12 (ref 12–20)
BUN: 9 MG/DL (ref 7.0–18)
Bilirubin, total: 0.3 MG/DL (ref 0.2–1.0)
CO2: 31 mmol/L (ref 21–32)
Calcium: 9.1 MG/DL (ref 8.5–10.1)
Chloride: 101 mmol/L (ref 100–108)
Creatinine: 0.76 MG/DL (ref 0.6–1.3)
GFR est AA: >60 mL/min/{1.73_m2} (ref >60)
GFR est non-AA: >60 mL/min/{1.73_m2} (ref >60)
Globulin: 5.1 g/dL — ABNORMAL HIGH (ref 2.0–4.0)
Glucose: 88 mg/dL (ref 74–99)
Potassium: 4.3 mmol/L (ref 3.5–5.5)
Protein, total: 7.7 g/dL (ref 6.4–8.2)
Sodium: 139 mmol/L (ref 136–145)

## 2016-05-16 LAB — CBC WITH AUTOMATED DIFF
ABS. BASOPHILS: 0.1 10*3/uL — ABNORMAL HIGH (ref 0.0–0.06)
ABS. EOSINOPHILS: 0.3 10*3/uL (ref 0.0–0.4)
ABS. LYMPHOCYTES: 3 10*3/uL (ref 0.9–3.6)
ABS. MONOCYTES: 0.6 10*3/uL (ref 0.05–1.2)
ABS. NEUTROPHILS: 3.1 10*3/uL (ref 1.8–8.0)
BASOPHILS: 1 % (ref 0–2)
EOSINOPHILS: 4 % (ref 0–5)
HCT: 37.7 % (ref 35.0–45.0)
HGB: 12.8 g/dL (ref 12.0–16.0)
LYMPHOCYTES: 43 % (ref 21–52)
MCH: 31.9 PG (ref 24.0–34.0)
MCHC: 34 g/dL (ref 31.0–37.0)
MCV: 94 FL (ref 74.0–97.0)
MONOCYTES: 8 % (ref 3–10)
MPV: 9.5 FL (ref 9.2–11.8)
NEUTROPHILS: 44 % (ref 40–73)
PLATELET: 385 10*3/uL (ref 135–420)
RBC: 4.01 M/uL — ABNORMAL LOW (ref 4.20–5.30)
RDW: 13.6 % (ref 11.6–14.5)
WBC: 7.1 10*3/uL (ref 4.6–13.2)

## 2016-05-16 MED ORDER — SODIUM CHLORIDE 0.9 % IJ SYRG
INTRAMUSCULAR | Status: DC | PRN
Start: 2016-05-16 — End: 2016-05-20
  Administered 2016-05-16 (×2): via INTRAVENOUS

## 2016-05-16 MED ORDER — ERTAPENEM 1 GRAM SOLUTION FOR INJECTION
1 gram | Freq: Once | INTRAMUSCULAR | Status: AC
Start: 2016-05-16 — End: 2016-05-16
  Administered 2016-05-16: 18:00:00 via INTRAVENOUS

## 2016-05-16 MED ORDER — HEPARIN, PORCINE (PF) 100 UNIT/ML IV SYRINGE
100 unit/mL | Freq: Once | INTRAVENOUS | Status: AC
Start: 2016-05-16 — End: 2016-05-16
  Administered 2016-05-16: 18:00:00

## 2016-05-16 MED FILL — BD POSIFLUSH NORMAL SALINE 0.9 % INJECTION SYRINGE: INTRAMUSCULAR | Qty: 10

## 2016-05-16 MED FILL — HEPARIN LOCK FLUSH (PORCINE) (PF) 100 UNIT/ML INTRAVENOUS SYRINGE: 100 unit/mL | INTRAVENOUS | Qty: 5

## 2016-05-16 MED FILL — INVANZ 1 GRAM SOLUTION FOR INJECTION: 1 gram | INTRAMUSCULAR | Qty: 1

## 2016-05-16 NOTE — Progress Notes (Addendum)
[facility] Progress Note    Date: May 16, 2016    Name: Denise [name]    MRN: [id number]         DOB: 1951-01-19      [name] arrived to [facility] at 1330.    [name] was assessed and education was provided. Patient states she has been experiencing intermittent dizziness.     [name] vitals were reviewed.  Visit Vitals   ??? BP 135/84 (BP 1 Location: Left arm, BP Patient Position: Sitting)   ??? Pulse 72   ??? Temp 98.2 ??F (36.8 ??C)   ??? Resp 18   ??? SpO2 96%       Pt with Right upper arm double lumen PICC line.  Each lumen flushes without difficulty and positive for blood return.  Dressing CDI.  No redness, bruising, or swelling noted at site and/ or extremity.  Pt denies tenderness. Blood drawn for labs via red lumen. Lab results obtained and reviewed.  Recent Results (from the past 12 hour(s))   CBC WITH AUTOMATED DIFF    Collection Time: 05/16/16  1:30 PM   Result Value Ref Range    WBC 7.1 4.6 - 13.2 K/uL    RBC 4.01 (L) 4.20 - 5.30 M/uL    HGB 12.8 12.0 - 16.0 g/dL    HCT 37.7 35.0 - 45.0 %    MCV 94.0 74.0 - 97.0 FL    MCH 31.9 24.0 - 34.0 PG    MCHC 34.0 31.0 - 37.0 g/dL    RDW 13.6 11.6 - 14.5 %    PLATELET 385 135 - 420 K/uL    MPV 9.5 9.2 - 11.8 FL    NEUTROPHILS 44 40 - 73 %    LYMPHOCYTES 43 21 - 52 %    MONOCYTES 8 3 - 10 %    EOSINOPHILS 4 0 - 5 %    BASOPHILS 1 0 - 2 %    ABS. NEUTROPHILS 3.1 1.8 - 8.0 K/UL    ABS. LYMPHOCYTES 3.0 0.9 - 3.6 K/UL    ABS. MONOCYTES 0.6 0.05 - 1.2 K/UL    ABS. EOSINOPHILS 0.3 0.0 - 0.4 K/UL    ABS. BASOPHILS 0.1 (H) 0.0 - 0.06 K/UL    DF AUTOMATED     METABOLIC PANEL, COMPREHENSIVE    Collection Time: 05/16/16  1:30 PM   Result Value Ref Range    Sodium 139 136 - 145 mmol/L    Potassium 4.3 3.5 - 5.5 mmol/L    Chloride 101 100 - 108 mmol/L    CO2 31 21 - 32 mmol/L    Anion gap 7 3.0 - 18 mmol/L    Glucose 88 74 - 99 mg/dL    BUN 9 7.0 - 18 MG/DL    Creatinine 0.76 0.6 - 1.3 MG/DL    BUN/Creatinine ratio 12 12 - 20      GFR est AA >60 >60 ml/min/1.46m     GFR est non-AA >60 >60 ml/min/1.763m   Calcium 9.1 8.5 - 10.1 MG/DL    Bilirubin, total 0.3 0.2 - 1.0 MG/DL    ALT (SGPT) 15 13 - 56 U/L    AST (SGOT) 19 15 - 37 U/L    Alk. phosphatase 84 45 - 117 U/L    Protein, total 7.7 6.4 - 8.2 g/dL    Albumin 2.6 (L) 3.4 - 5.0 g/dL    Globulin 5.1 (H) 2.0 - 4.0 g/dL    A-G Ratio  0.5 (L) 0.8 - 1.7           Invanz 1 g was administered as ordered via red lumen followed by normal saline flush.    [name] tolerated well without complaints.    Flushed each lumen of pt's PICC line with heparin per order.  Lumens wrapped in gauze and paper tape.  [name] was discharged from [facility] in stable condition at 1420.  She is to return on May 17, 2016 at 1500 for her next appointment.    Hoy Register, RN  May 16, 2016

## 2016-05-17 ENCOUNTER — Inpatient Hospital Stay: Admit: 2016-05-17 | Payer: Self-pay | Primary: Family Medicine

## 2016-05-17 MED ORDER — SODIUM CHLORIDE 0.9 % IJ SYRG
INTRAMUSCULAR | Status: DC | PRN
Start: 2016-05-17 — End: 2016-05-21
  Administered 2016-05-17 (×2): via INTRAVENOUS

## 2016-05-17 MED ORDER — ERTAPENEM 1 GRAM SOLUTION FOR INJECTION
1 gram | Freq: Once | INTRAMUSCULAR | Status: AC
Start: 2016-05-17 — End: 2016-05-17
  Administered 2016-05-17: 19:00:00 via INTRAVENOUS

## 2016-05-17 MED ORDER — HEPARIN, PORCINE (PF) 100 UNIT/ML IV SYRINGE
100 unit/mL | Freq: Once | INTRAVENOUS | Status: AC
Start: 2016-05-17 — End: 2016-05-17
  Administered 2016-05-17: 20:00:00

## 2016-05-17 MED FILL — BD POSIFLUSH NORMAL SALINE 0.9 % INJECTION SYRINGE: INTRAMUSCULAR | Qty: 10

## 2016-05-17 MED FILL — HEPARIN LOCK FLUSH (PORCINE) (PF) 100 UNIT/ML INTRAVENOUS SYRINGE: 100 unit/mL | INTRAVENOUS | Qty: 5

## 2016-05-17 MED FILL — INVANZ 1 GRAM SOLUTION FOR INJECTION: 1 gram | INTRAMUSCULAR | Qty: 1

## 2016-05-17 NOTE — Progress Notes (Signed)
[facility]    Date: May 17, 2016    Name: Denise DickerJanet Jo [name]    MRN: [id number]         DOB: 1951/06/06      [name]    Denise [name] was assessed and education was provided. Patient states she continues to experience intermittent dizziness. She believes it is her medication.    Denise [name]'s vitals were reviewed.  Visit Vitals   ??? BP 138/80 (BP 1 Location: Left arm, BP Patient Position: Sitting)   ??? Pulse 88   ??? Temp 98.1 ??F (36.7 ??C)   ??? Resp 18   ??? SpO2 94%       Pt with Right upper arm double lumen PICC line.  Each lumen flushes without difficulty and positive for blood return.  Dressing CDI.  No redness, bruising, or swelling noted at site and/ or extremity.  Pt denies tenderness.    Invanz 1 g was administered as ordered via purple lumen followed by normal saline flush.    Denise [name] tolerated well without complaints.    Flushed each lumen of pt's PICC line with heparin per order.  Lumens wrapped in gauze and paper tape.  Denise [name] was discharged from [facility] in stable condition at 1550.  She is to return on May 18, 2016 at 1400 for her next appointment.    Laurice RecordElise M Williams, RN  May 17, 2016

## 2016-05-18 ENCOUNTER — Inpatient Hospital Stay: Admit: 2016-05-18 | Payer: Self-pay | Primary: Family Medicine

## 2016-05-18 MED ORDER — HEPARIN, PORCINE (PF) 100 UNIT/ML IV SYRINGE
100 unit/mL | Freq: Once | INTRAVENOUS | Status: AC
Start: 2016-05-18 — End: 2016-05-19

## 2016-05-18 MED ORDER — SODIUM CHLORIDE 0.9 % IV PIGGY BACK
1 gram | Freq: Once | INTRAVENOUS | Status: AC
Start: 2016-05-18 — End: 2016-05-18
  Administered 2016-05-18: 18:00:00 via INTRAVENOUS

## 2016-05-18 MED ORDER — SODIUM CHLORIDE 0.9 % IJ SYRG
INTRAMUSCULAR | Status: DC | PRN
Start: 2016-05-18 — End: 2016-05-22

## 2016-05-18 MED FILL — INVANZ 1 GRAM SOLUTION FOR INJECTION: 1 gram | INTRAMUSCULAR | Qty: 1

## 2016-05-18 MED FILL — HEPARIN LOCK FLUSH (PORCINE) (PF) 100 UNIT/ML INTRAVENOUS SYRINGE: 100 unit/mL | INTRAVENOUS | Qty: 5

## 2016-05-18 MED FILL — BD POSIFLUSH NORMAL SALINE 0.9 % INJECTION SYRINGE: INTRAMUSCULAR | Qty: 10

## 2016-05-18 NOTE — Progress Notes (Signed)
[facility] Progress Note    Date: May 18, 2016    Name: Denise [name]    MRN: [id number]         DOB: Feb 14, 1951    Denise [name] was assessed and education was provided.     Denise [name]'s vitals were reviewed.  Visit Vitals   ??? BP 115/74 (BP 1 Location: Left arm, BP Patient Position: Sitting)   ??? Pulse 79   ??? Temp 98.5 ??F (36.9 ??C)   ??? Resp 18   ??? SpO2 96%               Vancomycin       Invanz 1 gram IV       Cubicin       Rocephin    was infused at  100 ml/hr.    Denise [name] tolerated infusion, and had no complaints at this time.  Patient armband removed and shredded.    Denise [name] was discharged from [facility] in stable condition at 1500. She is to return on 05/19/16 for her next appointment.    Mare LoanLisa M Creighton-Bey, RN  May 18, 2016  4:05 PM

## 2016-05-19 ENCOUNTER — Inpatient Hospital Stay: Admit: 2016-05-19 | Payer: Self-pay | Primary: Family Medicine

## 2016-05-19 MED ORDER — SODIUM CHLORIDE 0.9 % IJ SYRG
INTRAMUSCULAR | Status: DC | PRN
Start: 2016-05-19 — End: 2016-05-23
  Administered 2016-05-19: 18:00:00 via INTRAVENOUS

## 2016-05-19 MED ORDER — ERTAPENEM 1 GRAM SOLUTION FOR INJECTION
1 gram | Freq: Once | INTRAMUSCULAR | Status: AC
Start: 2016-05-19 — End: 2016-05-19
  Administered 2016-05-19: 18:00:00 via INTRAVENOUS

## 2016-05-19 MED ORDER — HEPARIN, PORCINE (PF) 100 UNIT/ML IV SYRINGE
100 unit/mL | Freq: Once | INTRAVENOUS | Status: AC
Start: 2016-05-19 — End: 2016-05-19
  Administered 2016-05-19: 18:00:00

## 2016-05-19 MED FILL — HEPARIN LOCK FLUSH (PORCINE) (PF) 100 UNIT/ML INTRAVENOUS SYRINGE: 100 unit/mL | INTRAVENOUS | Qty: 5

## 2016-05-19 MED FILL — INVANZ 1 GRAM SOLUTION FOR INJECTION: 1 gram | INTRAMUSCULAR | Qty: 1

## 2016-05-19 MED FILL — BD POSIFLUSH NORMAL SALINE 0.9 % INJECTION SYRINGE: INTRAMUSCULAR | Qty: 40

## 2016-05-19 NOTE — Progress Notes (Signed)
[facility] Progress Note    Date: May 19, 2016    Name: Denise [name]    MRN: [id number]         DOB: 1951-11-15    [name]    [name] 1335 . Pt was assessed and education was provided. Denise [name] reports that she has been very fatigued.    Denise [name]'s vitals were reviewed.  Visit Vitals   ??? BP 133/84 (BP 1 Location: Left arm, BP Patient Position: Lying left side;Sitting)   ??? Pulse 77   ??? Temp 98.1 ??F (36.7 ??C)   ??? Resp 18   ??? SpO2 92%       RIght upper arm PICC flushed easily and had brisk blood return via both ports.             Vancomycin       Invanz 1 gram       Cubicin       Rocephin    was infused at 200 ml/hr (over approximately 30 minutes).    PICC dressing and stat lock changed per protocol. No redness, streaking, warmth, or drainage noted at site. Microclaves changed.     Denise [name], and had no complaints at this time.    Both lumens of PICC flushed with NS 10 ml and Heparin 250 units. Lumens wrapped with guaze and paper tape. Stockinette placed over dressing for protection.    Patient armband removed and shredded.    Denise [name] [name] in stable condition at 1440. She is to return on 05/20/16 at 1330 for her next antibiotic appointment.    [name], RN  May 19, 2016

## 2016-05-20 ENCOUNTER — Inpatient Hospital Stay: Admit: 2016-05-20 | Payer: Self-pay | Primary: Family Medicine

## 2016-05-20 MED ORDER — SODIUM CHLORIDE 0.9 % IJ SYRG
INTRAMUSCULAR | Status: DC | PRN
Start: 2016-05-20 — End: 2016-05-24
  Administered 2016-05-20: 17:00:00 via INTRAVENOUS

## 2016-05-20 MED ORDER — ERTAPENEM 1 GRAM SOLUTION FOR INJECTION
1 gram | Freq: Once | INTRAMUSCULAR | Status: AC
Start: 2016-05-20 — End: 2016-05-20
  Administered 2016-05-20: 18:00:00 via INTRAVENOUS

## 2016-05-20 MED ORDER — HEPARIN, PORCINE (PF) 100 UNIT/ML IV SYRINGE
100 unit/mL | Freq: Once | INTRAVENOUS | Status: AC
Start: 2016-05-20 — End: 2016-05-21

## 2016-05-20 MED FILL — INVANZ 1 GRAM SOLUTION FOR INJECTION: 1 gram | INTRAMUSCULAR | Qty: 1

## 2016-05-20 MED FILL — BD POSIFLUSH NORMAL SALINE 0.9 % INJECTION SYRINGE: INTRAMUSCULAR | Qty: 40

## 2016-05-20 MED FILL — HEPARIN LOCK FLUSH (PORCINE) (PF) 100 UNIT/ML INTRAVENOUS SYRINGE: 100 unit/mL | INTRAVENOUS | Qty: 5

## 2016-05-20 NOTE — Progress Notes (Signed)
[facility] Progress Note    Date: May 20, 2016    Name: [name]    MRN: [id number]         DOB: [birth year]    [name] was assessed and education was provided.     [name] vitals were reviewed.  Visit Vitals   ??? BP 141/71 (BP 1 Location: Left arm, BP Patient Position: Sitting)   ??? Pulse 83   ??? Temp 98.2 ??F (36.8 ??C)   ??? Resp 18   ??? SpO2 94%               Vancomycin       Invanz 1 gram IV       Cubicin       Rocephin    was infused at  100 ml/hr.    [name] tolerated infusion, and had no complaints at this time.  Patient armband removed and shredded.    [name] [facility] condition at 1405. She is to return on 05/21/16 for her next appointment.    Edyth GunnelsJoycelyn Delania Ferg  May 20, 2016  4:05 PM

## 2016-05-21 ENCOUNTER — Inpatient Hospital Stay: Admit: 2016-05-21 | Payer: Self-pay | Primary: Family Medicine

## 2016-05-21 MED ORDER — SODIUM CHLORIDE 0.9 % IV PIGGY BACK
1 gram | Freq: Once | INTRAVENOUS | Status: AC
Start: 2016-05-21 — End: 2016-05-21
  Administered 2016-05-21: 14:00:00 via INTRAVENOUS

## 2016-05-21 MED ORDER — SODIUM CHLORIDE 0.9 % IJ SYRG
INTRAMUSCULAR | Status: DC | PRN
Start: 2016-05-21 — End: 2016-05-25
  Administered 2016-05-21: 15:00:00 via INTRAVENOUS

## 2016-05-21 MED ORDER — HEPARIN, PORCINE (PF) 100 UNIT/ML IV SYRINGE
100 unit/mL | Freq: Once | INTRAVENOUS | Status: AC
Start: 2016-05-21 — End: 2016-05-21
  Administered 2016-05-21: 15:00:00

## 2016-05-21 MED FILL — BD POSIFLUSH NORMAL SALINE 0.9 % INJECTION SYRINGE: INTRAMUSCULAR | Qty: 40

## 2016-05-21 MED FILL — HEPARIN LOCK FLUSH (PORCINE) (PF) 100 UNIT/ML INTRAVENOUS SYRINGE: 100 unit/mL | INTRAVENOUS | Qty: 5

## 2016-05-21 MED FILL — INVANZ 1 GRAM SOLUTION FOR INJECTION: 1 gram | INTRAMUSCULAR | Qty: 1

## 2016-05-21 NOTE — Progress Notes (Signed)
[facility] Progress Note    Date: May 21, 2016    Name: Denise DickerJanet Jo [name]    MRN: [id number]         DOB: 02/09/1951    [facility]    [facility] [place] [facility]5 . Pt was assessed and education was provided.     [name] vitals were reviewed.  Visit Vitals   ??? BP 115/66 (BP 1 Location: Left arm, BP Patient Position: Sitting)   ??? Pulse 66   ??? Temp 98.5 ??F (36.9 ??C)   ??? Resp 18   ??? SpO2 95%       Right upper arm PICC flushed easily and had brisk blood return via both ports.    PICC dressing c/d/i and not due to be changed. No swelling, redness, streaking, warmth or drainage noted in arm. Pt denied c/o pain in arm.        Vancomycin       Invanz 1 gram       Cubicin       Rocephin    was infused at 100 ml/hr (over approximately 30 minutes).        [name] tolerated infusion, and had no complaints at this time.    Both lumens of PICC flushed with NS 10 ml and Heparin 250 units. Lumens wrapped with guaze and paper tape. Stockinette placed over dressing for protection.    Patient armband removed and shredded.    [name] in stable condition at 1055. She is to return on 05/22/16 at 1000 for her next antibiotic appointment.    [name], RN  May 21, 2016

## 2016-05-22 ENCOUNTER — Inpatient Hospital Stay: Admit: 2016-05-22 | Payer: Self-pay | Primary: Family Medicine

## 2016-05-22 MED ORDER — SODIUM CHLORIDE 0.9 % IJ SYRG
INTRAMUSCULAR | Status: DC | PRN
Start: 2016-05-22 — End: 2016-05-26
  Administered 2016-05-22: 15:00:00 via INTRAVENOUS

## 2016-05-22 MED ORDER — SODIUM CHLORIDE 0.9 % IV PIGGY BACK
1 gram | Freq: Once | INTRAVENOUS | Status: AC
Start: 2016-05-22 — End: 2016-05-22
  Administered 2016-05-22: 14:00:00 via INTRAVENOUS

## 2016-05-22 MED ORDER — HEPARIN, PORCINE (PF) 100 UNIT/ML IV SYRINGE
100 unit/mL | Freq: Once | INTRAVENOUS | Status: AC
Start: 2016-05-22 — End: 2016-05-22
  Administered 2016-05-22: 15:00:00

## 2016-05-22 MED FILL — BD POSIFLUSH NORMAL SALINE 0.9 % INJECTION SYRINGE: INTRAMUSCULAR | Qty: 40

## 2016-05-22 MED FILL — INVANZ 1 GRAM SOLUTION FOR INJECTION: 1 gram | INTRAMUSCULAR | Qty: 1

## 2016-05-22 MED FILL — HEPARIN LOCK FLUSH (PORCINE) (PF) 100 UNIT/ML INTRAVENOUS SYRINGE: 100 unit/mL | INTRAVENOUS | Qty: 5

## 2016-05-22 NOTE — Progress Notes (Signed)
[facility] Progress Note    Date: May 22, 2016    Name: Denise [name]    MRN: [id number]         DOB: 1951/03/24    [facility]    [name], ambulatory at [phone] . Pt was assessed and education was provided.     Denise [name]'s vitals were reviewed.  Visit Vitals   ??? BP 134/66 (BP 1 Location: Left arm, BP Patient Position: Sitting)   ??? Pulse 71   ??? Temp 97.9 ??F (36.6 ??C)   ??? Resp 18   ??? SpO2 94%     PICC dressing not due to be changed.     Right upper arm PICC flushed easily and had brisk blood return via both ports.           Vancomycin       Invanz 1 gram       Cubicin       Rocephin    was infused at 100 ml/hr (over approximately 30 minutes).        Denise [name] tolerated infusion, and had no complaints at this time.    Both lumens of PICC flushed with NS 10 ml and Heparin 250 units. Lumens wrapped with guaze and paper tape. Stockinette placed over dressing for protection.    Patient armband removed and shredded.    Denise [name] was [facility] in stable condition at 1035. She is to return on 05/23/16 at 1400 for her next antibiotic appointment.    [name], RN  May 22, 2016

## 2016-05-23 ENCOUNTER — Inpatient Hospital Stay: Admit: 2016-05-23 | Payer: Self-pay | Primary: Family Medicine

## 2016-05-23 LAB — CBC WITH AUTOMATED DIFF
ABS. BASOPHILS: 0.1 10*3/uL — ABNORMAL HIGH (ref 0.0–0.06)
ABS. EOSINOPHILS: 0.4 10*3/uL (ref 0.0–0.4)
ABS. LYMPHOCYTES: 2.4 10*3/uL (ref 0.9–3.6)
ABS. MONOCYTES: 0.5 10*3/uL (ref 0.05–1.2)
ABS. NEUTROPHILS: 2.4 10*3/uL (ref 1.8–8.0)
BASOPHILS: 1 % (ref 0–2)
EOSINOPHILS: 8 % — ABNORMAL HIGH (ref 0–5)
HCT: 37.5 % (ref 35.0–45.0)
HGB: 12.6 g/dL (ref 12.0–16.0)
LYMPHOCYTES: 41 % (ref 21–52)
MCH: 31.6 PG (ref 24.0–34.0)
MCHC: 33.6 g/dL (ref 31.0–37.0)
MCV: 94 FL (ref 74.0–97.0)
MONOCYTES: 8 % (ref 3–10)
MPV: 10 FL (ref 9.2–11.8)
NEUTROPHILS: 42 % (ref 40–73)
PLATELET: 247 10*3/uL (ref 135–420)
RBC: 3.99 M/uL — ABNORMAL LOW (ref 4.20–5.30)
RDW: 13.6 % (ref 11.6–14.5)
WBC: 5.8 10*3/uL (ref 4.6–13.2)

## 2016-05-23 LAB — METABOLIC PANEL, COMPREHENSIVE
A-G Ratio: 0.6 — ABNORMAL LOW (ref 0.8–1.7)
ALT (SGPT): 27 U/L (ref 13–56)
AST (SGOT): 35 U/L (ref 15–37)
Albumin: 2.7 g/dL — ABNORMAL LOW (ref 3.4–5.0)
Alk. phosphatase: 80 U/L (ref 45–117)
Anion gap: 6 mmol/L (ref 3.0–18)
BUN/Creatinine ratio: 16 (ref 12–20)
BUN: 10 MG/DL (ref 7.0–18)
Bilirubin, total: 0.3 MG/DL (ref 0.2–1.0)
CO2: 31 mmol/L (ref 21–32)
Calcium: 8.9 MG/DL (ref 8.5–10.1)
Chloride: 104 mmol/L (ref 100–108)
Creatinine: 0.64 MG/DL (ref 0.6–1.3)
GFR est AA: >60 mL/min/{1.73_m2} (ref >60)
GFR est non-AA: >60 mL/min/{1.73_m2} (ref >60)
Globulin: 4.6 g/dL — ABNORMAL HIGH (ref 2.0–4.0)
Glucose: 107 mg/dL — ABNORMAL HIGH (ref 74–99)
Potassium: 3.8 mmol/L (ref 3.5–5.5)
Protein, total: 7.3 g/dL (ref 6.4–8.2)
Sodium: 141 mmol/L (ref 136–145)

## 2016-05-23 MED ORDER — SODIUM CHLORIDE 0.9 % IV PIGGY BACK
1 gram | Freq: Once | INTRAVENOUS | Status: AC
Start: 2016-05-23 — End: 2016-05-23
  Administered 2016-05-23: 18:00:00 via INTRAVENOUS

## 2016-05-23 MED ORDER — HEPARIN, PORCINE (PF) 100 UNIT/ML IV SYRINGE
100 unit/mL | Freq: Once | INTRAVENOUS | Status: AC
Start: 2016-05-23 — End: 2016-05-23
  Administered 2016-05-23: 19:00:00

## 2016-05-23 MED ORDER — SODIUM CHLORIDE 0.9 % IJ SYRG
INTRAMUSCULAR | Status: DC | PRN
Start: 2016-05-23 — End: 2016-05-27
  Administered 2016-05-23 (×2): via INTRAVENOUS

## 2016-05-23 MED FILL — BD POSIFLUSH NORMAL SALINE 0.9 % INJECTION SYRINGE: INTRAMUSCULAR | Qty: 10

## 2016-05-23 MED FILL — INVANZ 1 GRAM SOLUTION FOR INJECTION: 1 gram | INTRAMUSCULAR | Qty: 1

## 2016-05-23 MED FILL — HEPARIN LOCK FLUSH (PORCINE) (PF) 100 UNIT/ML INTRAVENOUS SYRINGE: 100 unit/mL | INTRAVENOUS | Qty: 5

## 2016-05-23 NOTE — Progress Notes (Signed)
[facility] Progress Note    Date: May 23, 2016    Name: Denise [name]    MRN: [id number]         DOB: [birth year]      [name] arrived to [facility] at 1355.    [name] was assessed and education was provided.     [name] vitals were reviewed.  Visit Vitals   ??? BP 125/75 (BP 1 Location: Left arm, BP Patient Position: Sitting)   ??? Pulse 74   ??? Temp 98.2 ??F (36.8 ??C)   ??? Resp 18   ??? SpO2 95%       Pt with Right upper arm double lumen PICC line.  Each lumen flushes without difficulty and positive for blood return.  Dressing CDI.  No redness, bruising, or swelling noted at site and/ or extremity.  Pt denies tenderness. Blood drawn for labs via red lumen. Lab results obtained and reviewed.  Recent Results (from the past 12 hour(s))   CBC WITH AUTOMATED DIFF    Collection Time: 05/23/16  2:10 PM   Result Value Ref Range    WBC 5.8 4.6 - 13.2 K/uL    RBC 3.99 (L) 4.20 - 5.30 M/uL    HGB 12.6 12.0 - 16.0 g/dL    HCT 37.5 35.0 - 45.0 %    MCV 94.0 74.0 - 97.0 FL    MCH 31.6 24.0 - 34.0 PG    MCHC 33.6 31.0 - 37.0 g/dL    RDW 13.6 11.6 - 14.5 %    PLATELET 247 135 - 420 K/uL    MPV 10.0 9.2 - 11.8 FL    NEUTROPHILS 42 40 - 73 %    LYMPHOCYTES 41 21 - 52 %    MONOCYTES 8 3 - 10 %    EOSINOPHILS 8 (H) 0 - 5 %    BASOPHILS 1 0 - 2 %    ABS. NEUTROPHILS 2.4 1.8 - 8.0 K/UL    ABS. LYMPHOCYTES 2.4 0.9 - 3.6 K/UL    ABS. MONOCYTES 0.5 0.05 - 1.2 K/UL    ABS. EOSINOPHILS 0.4 0.0 - 0.4 K/UL    ABS. BASOPHILS 0.1 (H) 0.0 - 0.06 K/UL    DF AUTOMATED     METABOLIC PANEL, COMPREHENSIVE    Collection Time: 05/23/16  2:10 PM   Result Value Ref Range    Sodium 141 136 - 145 mmol/L    Potassium 3.8 3.5 - 5.5 mmol/L    Chloride 104 100 - 108 mmol/L    CO2 31 21 - 32 mmol/L    Anion gap 6 3.0 - 18 mmol/L    Glucose 107 (H) 74 - 99 mg/dL    BUN 10 7.0 - 18 MG/DL    Creatinine 0.64 0.6 - 1.3 MG/DL    BUN/Creatinine ratio 16 12 - 20      GFR est AA >60 >60 ml/min/1.60m    GFR est non-AA >60 >60 ml/min/1.757m   Calcium 8.9 8.5 - 10.1 MG/DL     Bilirubin, total 0.3 0.2 - 1.0 MG/DL    ALT (SGPT) 27 13 - 56 U/L    AST (SGOT) 35 15 - 37 U/L    Alk. phosphatase 80 45 - 117 U/L    Protein, total 7.3 6.4 - 8.2 g/dL    Albumin 2.7 (L) 3.4 - 5.0 g/dL    Globulin 4.6 (H) 2.0 - 4.0 g/dL    A-G Ratio 0.6 (L) 0.8 - 1.7  Invanz 1 g was administered as ordered via purple lumen followed by normal saline flush.    [name] tolerated well without complaints.    Flushed each lumen of pt's PICC line with heparin per order.  Lumens wrapped in gauze and paper tape.    [name] was discharged from [facility] in stable condition at 1450.  She is to return on May 24, 2016  at 1330 for her next appointment.    Hoy Register, RN  May 23, 2016

## 2016-05-24 ENCOUNTER — Inpatient Hospital Stay: Admit: 2016-05-24 | Payer: Self-pay | Primary: Family Medicine

## 2016-05-24 MED ORDER — SODIUM CHLORIDE 0.9 % IJ SYRG
INTRAMUSCULAR | Status: DC | PRN
Start: 2016-05-24 — End: 2016-05-28
  Administered 2016-05-24: 18:00:00 via INTRAVENOUS

## 2016-05-24 MED ORDER — HEPARIN, PORCINE (PF) 100 UNIT/ML IV SYRINGE
100 unit/mL | Freq: Once | INTRAVENOUS | Status: AC
Start: 2016-05-24 — End: 2016-05-24
  Administered 2016-05-24: 18:00:00

## 2016-05-24 MED ORDER — SODIUM CHLORIDE 0.9 % IV PIGGY BACK
1 gram | Freq: Once | INTRAVENOUS | Status: AC
Start: 2016-05-24 — End: 2016-05-24
  Administered 2016-05-24: 17:00:00 via INTRAVENOUS

## 2016-05-24 MED FILL — HEPARIN LOCK FLUSH (PORCINE) (PF) 100 UNIT/ML INTRAVENOUS SYRINGE: 100 unit/mL | INTRAVENOUS | Qty: 5

## 2016-05-24 MED FILL — BD POSIFLUSH NORMAL SALINE 0.9 % INJECTION SYRINGE: INTRAMUSCULAR | Qty: 40

## 2016-05-24 MED FILL — INVANZ 1 GRAM SOLUTION FOR INJECTION: 1 gram | INTRAMUSCULAR | Qty: 1

## 2016-05-24 NOTE — Progress Notes (Signed)
[facility] Progress Note    Date: May 24, 2016    Name: Denise DickerJanet Jo [name]    MRN: [id number]         DOB: 1951-10-09  [facility]    [name] [facility], ambulatory at 1310 . Pt was assessed and education was provided.     [name] vitals were reviewed.  Visit Vitals   ??? BP 111/72 (BP 1 Location: Left arm, BP Patient Position: Sitting)   ??? Pulse 77   ??? Temp 98 ??F (36.7 ??C)   ??? Resp 18   ??? SpO2 93%       Right  upper arm PICC flushed easily and had brisk blood return via both ports.     PICC dressing c/d/i and not due to be changed. No swelling, redness, streaking, warmth or drainage noted in arm. Pt denied c/o pain in arm.        Vancomycin       Invanz 1 gram       Cubicin       Rocephin    was infused at 100 ml/hr (over approximately 30 minutes).        [name] tolerated infusion, and had no complaints at this time.    Both lumens of PICC flushed with NS 10 ml and Heparin 250 units. Lumens wrapped with guaze and paper tape. Stockinette placed over dressing for protection.    Patient armband removed and shredded.    [name] [facility] in stable condition at 1400. She is to return on 05/25/16 at 1300 for her next antibiotic appointment.    [name], RN  May 24, 2016

## 2016-05-25 ENCOUNTER — Inpatient Hospital Stay: Admit: 2016-05-25 | Payer: Self-pay | Primary: Family Medicine

## 2016-05-25 MED ORDER — HEPARIN, PORCINE (PF) 100 UNIT/ML IV SYRINGE
100 unit/mL | Freq: Once | INTRAVENOUS | Status: AC
Start: 2016-05-25 — End: 2016-05-25
  Administered 2016-05-25: 18:00:00

## 2016-05-25 MED ORDER — SODIUM CHLORIDE 0.9 % IJ SYRG
INTRAMUSCULAR | Status: DC | PRN
Start: 2016-05-25 — End: 2016-05-29
  Administered 2016-05-25: 18:00:00 via INTRAVENOUS

## 2016-05-25 MED ORDER — ERTAPENEM 1 GRAM SOLUTION FOR INJECTION
1 gram | Freq: Once | INTRAMUSCULAR | Status: AC
Start: 2016-05-25 — End: 2016-05-25
  Administered 2016-05-25: 17:00:00 via INTRAVENOUS

## 2016-05-25 MED FILL — BD POSIFLUSH NORMAL SALINE 0.9 % INJECTION SYRINGE: INTRAMUSCULAR | Qty: 40

## 2016-05-25 MED FILL — HEPARIN LOCK FLUSH (PORCINE) (PF) 100 UNIT/ML INTRAVENOUS SYRINGE: 100 unit/mL | INTRAVENOUS | Qty: 5

## 2016-05-25 MED FILL — INVANZ 1 GRAM SOLUTION FOR INJECTION: 1 gram | INTRAMUSCULAR | Qty: 1

## 2016-05-25 NOTE — Progress Notes (Signed)
[facility] Progress Note    Date: May 25, 2016    Name: Denise DickerJanet Jo Cottman    MRN: [id number]         DOB: 08/26/1951    [facility]    [name] [facility] at 1315. Pt was assessed and education was provided.     [name] vitals were reviewed.  Visit Vitals   ??? BP 112/82 (BP 1 Location: Left arm, BP Patient Position: Sitting)   ??? Pulse 78   ??? Temp 98.3 ??F (36.8 ??C)   ??? Resp 18   ??? SpO2 93%       RIght upper arm PICC flushed easily and had brisk blood return via both ports.   PICC dressing c/d/i and not due to be changed. No swelling, redness, streaking, warmth or drainage noted in arm. Pt denied c/o pain in arm.        Vancomycin       Invanz 1 gram       Cubicin       Rocephin    was infused at 100 ml/hr (over approximately 30 minutes).    [name] tolerated infusion, and had no complaints at this time.    Both lumens of PICC flushed with NS 10 ml and Heparin 250 units. Lumens wrapped with guaze and paper tape. Stockinette placed over dressing for protection.    Patient armband removed and shredded.    [name] [facility] in stable condition at 1400. She is to return on 05/26/16 at 1330 for her next antibiotic appointment.    [name], RN  May 25, 2016

## 2016-05-26 ENCOUNTER — Inpatient Hospital Stay: Admit: 2016-05-26 | Payer: Self-pay | Primary: Family Medicine

## 2016-05-26 MED ORDER — ERTAPENEM 1 GRAM SOLUTION FOR INJECTION
1 gram | Freq: Once | INTRAMUSCULAR | Status: AC
Start: 2016-05-26 — End: 2016-05-26
  Administered 2016-05-26: 18:00:00 via INTRAVENOUS

## 2016-05-26 MED ORDER — SODIUM CHLORIDE 0.9 % IJ SYRG
INTRAMUSCULAR | Status: DC | PRN
Start: 2016-05-26 — End: 2016-05-30
  Administered 2016-05-26: 18:00:00 via INTRAVENOUS

## 2016-05-26 MED ORDER — HEPARIN, PORCINE (PF) 100 UNIT/ML IV SYRINGE
100 unit/mL | Freq: Once | INTRAVENOUS | Status: AC
Start: 2016-05-26 — End: 2016-05-26
  Administered 2016-05-26: 18:00:00

## 2016-05-26 MED FILL — BD POSIFLUSH NORMAL SALINE 0.9 % INJECTION SYRINGE: INTRAMUSCULAR | Qty: 40

## 2016-05-26 MED FILL — INVANZ 1 GRAM SOLUTION FOR INJECTION: 1 gram | INTRAMUSCULAR | Qty: 1

## 2016-05-26 MED FILL — HEPARIN LOCK FLUSH (PORCINE) (PF) 100 UNIT/ML INTRAVENOUS SYRINGE: 100 unit/mL | INTRAVENOUS | Qty: 5

## 2016-05-26 NOTE — Progress Notes (Signed)
[facility] Progress Note    Date: May 26, 2016    Name: Denise DickerJanet Jo [name]    MRN: [id number]         DOB: 09/11/1951    [facility]    [name] [facility] 1320 . Pt was assessed and education was provided.     [name] vitals were reviewed.  Visit Vitals   ??? BP 119/59 (BP 1 Location: Left arm, BP Patient Position: Sitting)   ??? Pulse 67   ??? Temp 98.2 ??F (36.8 ??C)   ??? Resp 18   ??? SpO2 96%       Right upper arm PICC flushed easily and had brisk blood return via both ports.             Vancomycin       Invanz 1 gram       Cubicin       Rocephin    was infused at 100 ml/hr (over approximately 30 minutes).    PICC dressing and stat lock changed per protocol. No redness, streaking, warmth, or drainage noted at site. Microclaves changed.     [name] tolerated infusion, and had no complaints at this time.    Both lumens of PICC flushed with NS 10 ml and Heparin 250 units. Lumens wrapped with guaze and paper tape. Stockinette placed over dressing for protection.    Patient armband removed and shredded.    [name] [facility] in stable condition at 1425. She is to return on 05/27/16 at 1330 for her next antibiotic appointment.    [name], RN  May 26, 2016

## 2016-05-27 ENCOUNTER — Inpatient Hospital Stay: Admit: 2016-05-27 | Payer: Self-pay | Primary: Family Medicine

## 2016-05-27 MED ORDER — HEPARIN, PORCINE (PF) 100 UNIT/ML IV SYRINGE
100 unit/mL | Freq: Once | INTRAVENOUS | Status: AC
Start: 2016-05-27 — End: 2016-05-27
  Administered 2016-05-27: 18:00:00

## 2016-05-27 MED ORDER — SODIUM CHLORIDE 0.9 % IJ SYRG
INTRAMUSCULAR | Status: DC | PRN
Start: 2016-05-27 — End: 2016-05-31
  Administered 2016-05-27: 18:00:00 via INTRAVENOUS

## 2016-05-27 MED ORDER — ERTAPENEM 1 GRAM SOLUTION FOR INJECTION
1 gram | Freq: Once | INTRAMUSCULAR | Status: AC
Start: 2016-05-27 — End: 2016-05-27
  Administered 2016-05-27: 18:00:00 via INTRAVENOUS

## 2016-05-27 MED FILL — BD POSIFLUSH NORMAL SALINE 0.9 % INJECTION SYRINGE: INTRAMUSCULAR | Qty: 40

## 2016-05-27 MED FILL — INVANZ 1 GRAM SOLUTION FOR INJECTION: 1 gram | INTRAMUSCULAR | Qty: 1

## 2016-05-27 MED FILL — HEPARIN LOCK FLUSH (PORCINE) (PF) 100 UNIT/ML INTRAVENOUS SYRINGE: 100 unit/mL | INTRAVENOUS | Qty: 5

## 2016-05-27 NOTE — Progress Notes (Signed)
[facility] Progress Note    Date: May 27, 2016    Name: Denise [name]    MRN: [id number]         DOB: 1951-02-12    [facility]    [name] [facility], ambulatory at 1335 . Pt was assessed and education was provided.     Denise [name]'s vitals were reviewed.  Visit Vitals   ??? BP 96/57 (BP 1 Location: Left arm, BP Patient Position: Sitting)   ??? Pulse 68   ??? Temp 97.5 ??F (36.4 ??C)   ??? Resp 18   ??? SpO2 97%       RIght upper arm PICC flushed easily and had brisk blood return via both ports.     PICC dressing c/d/i and not due to be changed. No swelling, redness, streaking, warmth or drainage noted in arm. Pt denied c/o pain in arm.        Vancomycin       Invanz 1 gram       Cubicin       Rocephin    was infused at 100 ml/hr (over approximately 30 minutes).        Denise [name] tolerated infusion, and had no complaints at this time.    Both lumens of PICC flushed with NS 10 ml and Heparin 250 units. Lumens wrapped with guaze and paper tape. Stockinette placed over dressing for protection.    Patient armband removed and shredded.    Denise [name] was [facility] in stable condition at 1420. She is to return on 05/28/16 at 1000 for her next antibiotic appointment.    [name], RN  May 27, 2016

## 2016-05-28 ENCOUNTER — Inpatient Hospital Stay: Admit: 2016-05-28 | Payer: Self-pay | Primary: Family Medicine

## 2016-05-28 MED ORDER — HEPARIN, PORCINE (PF) 100 UNIT/ML IV SYRINGE
100 unit/mL | Freq: Once | INTRAVENOUS | Status: AC
Start: 2016-05-28 — End: 2016-05-28
  Administered 2016-05-28: 15:00:00

## 2016-05-28 MED ORDER — SODIUM CHLORIDE 0.9 % IJ SYRG
INTRAMUSCULAR | Status: DC | PRN
Start: 2016-05-28 — End: 2016-06-01
  Administered 2016-05-28 (×2): via INTRAVENOUS

## 2016-05-28 MED ORDER — SODIUM CHLORIDE 0.9 % IV PIGGY BACK
1 gram | Freq: Once | INTRAVENOUS | Status: AC
Start: 2016-05-28 — End: 2016-05-28
  Administered 2016-05-28: 14:00:00 via INTRAVENOUS

## 2016-05-28 MED FILL — BD POSIFLUSH NORMAL SALINE 0.9 % INJECTION SYRINGE: INTRAMUSCULAR | Qty: 10

## 2016-05-28 MED FILL — INVANZ 1 GRAM SOLUTION FOR INJECTION: 1 gram | INTRAMUSCULAR | Qty: 1

## 2016-05-28 NOTE — Progress Notes (Signed)
[facility] Progress Note    Date: May 28, 2016    Name: Verl DickerJanet Jo Vanderpol    MRN: [id number]         DOB: 31-Oct-1951    [facility]    [facility] . Pt was assessed and education was provided.     [name] vitals were reviewed.  Visit Vitals   ??? BP 141/61 (BP 1 Location: Left arm, BP Patient Position: Standing)   ??? Pulse 73   ??? Temp 98.6 ??F (37 ??C)   ??? Resp 18   ??? SpO2 98%       RIght upper arm PICC flushed easily and had brisk blood return via both ports.     PICC dressing c/d/i and not due to be changed. No swelling, redness, streaking, warmth or drainage noted in arm. Pt denied c/o pain in arm.        Vancomycin       Invanz 1 gram       Cubicin       Rocephin    was infused at 100 ml/hr (over approximately 30 minutes).        [name] tolerated infusion, and had no complaints at this time.    Both lumens of PICC flushed with NS 10 ml and Heparin 250 units. Lumens wrapped with guaze and paper tape. Stockinette placed over dressing for protection.    Patient armband removed and shredded.    [name] from [facility] in stable condition at 1045. She is to return on 05/29/16 at 1000 for her next antibiotic appointment.    Francene FindersBelinda M Kiela Shisler, RN  May 28, 2016

## 2016-05-29 ENCOUNTER — Inpatient Hospital Stay: Admit: 2016-05-29 | Payer: Self-pay | Primary: Family Medicine

## 2016-05-29 MED ORDER — SODIUM CHLORIDE 0.9 % IJ SYRG
INTRAMUSCULAR | Status: DC | PRN
Start: 2016-05-29 — End: 2016-06-02
  Administered 2016-05-29 (×2): via INTRAVENOUS

## 2016-05-29 MED ORDER — HEPARIN, PORCINE (PF) 100 UNIT/ML IV SYRINGE
100 unit/mL | Freq: Once | INTRAVENOUS | Status: AC
Start: 2016-05-29 — End: 2016-05-29
  Administered 2016-05-29: 15:00:00

## 2016-05-29 MED ORDER — SODIUM CHLORIDE 0.9 % IV PIGGY BACK
1 gram | Freq: Once | INTRAVENOUS | Status: AC
Start: 2016-05-29 — End: 2016-05-29
  Administered 2016-05-29: 14:00:00 via INTRAVENOUS

## 2016-05-29 MED FILL — BD POSIFLUSH NORMAL SALINE 0.9 % INJECTION SYRINGE: INTRAMUSCULAR | Qty: 10

## 2016-05-29 MED FILL — INVANZ 1 GRAM SOLUTION FOR INJECTION: 1 gram | INTRAMUSCULAR | Qty: 1

## 2016-05-29 NOTE — Progress Notes (Signed)
[facility] Progress Note    Date: May 29, 2016    Name: Denise [name]    MRN: [id number]         DOB: 12-24-1950    [facility]    [name] to [facility], ambulatory at 1005 . Pt was assessed and education was provided.     [name] vitals were reviewed.  Visit Vitals   ??? BP 132/55 (BP 1 Location: Left arm, BP Patient Position: Sitting)   ??? Pulse 74   ??? Temp 98.4 ??F (36.9 ??C)   ??? Resp 18   ??? SpO2 98%       RIght upper arm PICC flushed easily and had brisk blood return via both ports.     PICC dressing c/d/i and not due to be changed. No swelling, redness, streaking, warmth or drainage noted in arm. Pt denied c/o pain in arm.        Vancomycin       Invanz 1 gram       Cubicin       Rocephin    was infused at 100 ml/hr (over approximately 30 minutes).        [name] tolerated infusion, and had no complaints at this time.    Both lumens of PICC flushed with NS 10 ml and Heparin 250 units. Lumens wrapped with guaze and paper tape. Stockinette placed over dressing for protection.    Patient armband removed and shredded.    [name] [facility] in stable condition at 1055 She is to return on 05/30/16 for her next antibiotic appointment.    Francene FindersBelinda M Denean Pavon, RN  May 29, 2016

## 2016-05-30 ENCOUNTER — Inpatient Hospital Stay: Admit: 2016-05-30 | Payer: Self-pay | Primary: Family Medicine

## 2016-05-30 LAB — CBC WITH AUTOMATED DIFF
ABS. BASOPHILS: 0.1 10*3/uL — ABNORMAL HIGH (ref 0.0–0.06)
ABS. EOSINOPHILS: 0.4 10*3/uL (ref 0.0–0.4)
ABS. LYMPHOCYTES: 2.3 10*3/uL (ref 0.9–3.6)
ABS. MONOCYTES: 0.5 10*3/uL (ref 0.05–1.2)
ABS. NEUTROPHILS: 2.8 10*3/uL (ref 1.8–8.0)
BASOPHILS: 1 % (ref 0–2)
EOSINOPHILS: 7 % — ABNORMAL HIGH (ref 0–5)
HCT: 36.9 % (ref 35.0–45.0)
HGB: 12.3 g/dL (ref 12.0–16.0)
LYMPHOCYTES: 38 % (ref 21–52)
MCH: 31.3 PG (ref 24.0–34.0)
MCHC: 33.3 g/dL (ref 31.0–37.0)
MCV: 93.9 FL (ref 74.0–97.0)
MONOCYTES: 9 % (ref 3–10)
MPV: 10.4 FL (ref 9.2–11.8)
NEUTROPHILS: 45 % (ref 40–73)
PLATELET: 246 10*3/uL (ref 135–420)
RBC: 3.93 M/uL — ABNORMAL LOW (ref 4.20–5.30)
RDW: 13.8 % (ref 11.6–14.5)
WBC: 6.1 10*3/uL (ref 4.6–13.2)

## 2016-05-30 LAB — METABOLIC PANEL, COMPREHENSIVE
A-G Ratio: 0.7 — ABNORMAL LOW (ref 0.8–1.7)
ALT (SGPT): 47 U/L (ref 13–56)
AST (SGOT): 42 U/L — ABNORMAL HIGH (ref 15–37)
Albumin: 2.8 g/dL — ABNORMAL LOW (ref 3.4–5.0)
Alk. phosphatase: 88 U/L (ref 45–117)
Anion gap: 7 mmol/L (ref 3.0–18)
BUN/Creatinine ratio: 18 (ref 12–20)
BUN: 11 MG/DL (ref 7.0–18)
Bilirubin, total: 0.4 MG/DL (ref 0.2–1.0)
CO2: 31 mmol/L (ref 21–32)
Calcium: 8.9 MG/DL (ref 8.5–10.1)
Chloride: 103 mmol/L (ref 100–108)
Creatinine: 0.62 MG/DL (ref 0.6–1.3)
GFR est AA: >60 mL/min/{1.73_m2} (ref >60)
GFR est non-AA: >60 mL/min/{1.73_m2} (ref >60)
Globulin: 4.3 g/dL — ABNORMAL HIGH (ref 2.0–4.0)
Glucose: 69 mg/dL — ABNORMAL LOW (ref 74–99)
Potassium: 4.3 mmol/L (ref 3.5–5.5)
Protein, total: 7.1 g/dL (ref 6.4–8.2)
Sodium: 141 mmol/L (ref 136–145)

## 2016-05-30 MED ORDER — HEPARIN, PORCINE (PF) 100 UNIT/ML IV SYRINGE
100 unit/mL | INTRAVENOUS | Status: DC | PRN
Start: 2016-05-30 — End: 2016-06-03
  Administered 2016-05-30: 19:00:00

## 2016-05-30 MED ORDER — SODIUM CHLORIDE 0.9 % IJ SYRG
INTRAMUSCULAR | Status: DC | PRN
Start: 2016-05-30 — End: 2016-06-03
  Administered 2016-05-30: 18:00:00 via INTRAVENOUS

## 2016-05-30 MED ORDER — ERTAPENEM 1 GRAM SOLUTION FOR INJECTION
1 gram | Freq: Once | INTRAMUSCULAR | Status: AC
Start: 2016-05-30 — End: 2016-05-30
  Administered 2016-05-30: 18:00:00 via INTRAVENOUS

## 2016-05-30 MED FILL — BD POSIFLUSH NORMAL SALINE 0.9 % INJECTION SYRINGE: INTRAMUSCULAR | Qty: 40

## 2016-05-30 MED FILL — HEPARIN LOCK FLUSH (PORCINE) (PF) 100 UNIT/ML INTRAVENOUS SYRINGE: 100 unit/mL | INTRAVENOUS | Qty: 5

## 2016-05-30 MED FILL — INVANZ 1 GRAM SOLUTION FOR INJECTION: 1 gram | INTRAMUSCULAR | Qty: 1

## 2016-05-30 NOTE — Progress Notes (Signed)
[facility] Progress [facility]    Date: May 30, 2016    Name: Verl DickerJanet Jo Gallus    MRN: [id number]         DOB: [birth year]      [facility]    [name] was assessed and education was provided.     [name] vitals were reviewed.  Visit Vitals   ??? BP 127/74 (BP 1 Location: Left arm, BP Patient Position: Sitting)   ??? Pulse 64   ??? Temp 97.9 ??F (36.6 ??C)   ??? Resp 18   ??? SpO2 98%       Pt with right upper arm double lumen PICC line.  Each lumen flushes without difficulty and positive for blood return.  Dressing CDI.  No redness, bruising, or swelling noted at site and/ or extremity.  Pt denies tenderness.    Invanz 1g was administered as ordered via purple lumen followed by normal saline flush.    [name] tolerated well without complaints.    Flushed each lumen of pt's PICC line with heparin per order.  Wrapped lumens w/ gauze & paper tape.    [name] was discharged [facility] in stable condition at 1440.  She is to return on 05/31/16 at 1330 for her next appointment.    Tandy GawStacey R Lebo, RN  May 30, 2016

## 2016-05-31 ENCOUNTER — Inpatient Hospital Stay: Admit: 2016-05-31 | Payer: Self-pay | Primary: Family Medicine

## 2016-05-31 MED ORDER — SODIUM CHLORIDE 0.9 % IJ SYRG
INTRAMUSCULAR | Status: DC | PRN
Start: 2016-05-31 — End: 2016-06-04
  Administered 2016-05-31: 18:00:00 via INTRAVENOUS

## 2016-05-31 MED ORDER — HEPARIN, PORCINE (PF) 100 UNIT/ML IV SYRINGE
100 unit/mL | Freq: Once | INTRAVENOUS | Status: AC
Start: 2016-05-31 — End: 2016-05-31
  Administered 2016-05-31: 18:00:00

## 2016-05-31 MED ORDER — ERTAPENEM 1 GRAM SOLUTION FOR INJECTION
1 gram | Freq: Once | INTRAMUSCULAR | Status: AC
Start: 2016-05-31 — End: 2016-05-31
  Administered 2016-05-31: 18:00:00 via INTRAVENOUS

## 2016-05-31 MED FILL — HEPARIN LOCK FLUSH (PORCINE) (PF) 100 UNIT/ML INTRAVENOUS SYRINGE: 100 unit/mL | INTRAVENOUS | Qty: 5

## 2016-05-31 MED FILL — BD POSIFLUSH NORMAL SALINE 0.9 % INJECTION SYRINGE: INTRAMUSCULAR | Qty: 40

## 2016-05-31 MED FILL — INVANZ 1 GRAM SOLUTION FOR INJECTION: 1 gram | INTRAMUSCULAR | Qty: 1

## 2016-05-31 NOTE — Progress Notes (Signed)
[facility] Progress Note    Date: May 31, 2016    Name: Denise [name]    MRN: [id number]         DOB: 01-13-1951    [facility]    [facility] [place] [facility] [facility] at 1325 . Pt was assessed and education was provided.     [name]'s vitals were reviewed.  Visit Vitals   ??? BP 142/74 (BP 1 Location: Left arm, BP Patient Position: Sitting)   ??? Pulse 94   ??? Temp 98.4 ??F (36.9 ??C)   ??? Resp 18   ??? SpO2 99%       Right upper arm PICC flushed easily and had brisk blood return via both ports.   PICC dressing c/d/i and not due to be changed. No swelling, redness, streaking, warmth or drainage noted in arm. Pt denied c/o pain in arm.        Vancomycin       Invanz 1 gram       Cubicin       Rocephin    was infused at 100 ml/hr (over approximately 30 minutes).      [name] tolerated infusion, and had no complaints at this time.    Both lumens of PICC flushed with NS 10 ml and Heparin 250 units. Lumens wrapped with guaze and paper tape. Stockinette placed over dressing for protection.    Patient armband removed and shredded.    [name] was discharged [facility] in stable condition at 1415. She is to return on 06/01/16 at 1330 for her next antibiotic appointment.    [name], RN  May 31, 2016

## 2016-06-01 ENCOUNTER — Inpatient Hospital Stay: Admit: 2016-06-01 | Payer: Self-pay | Primary: Family Medicine

## 2016-06-01 MED ORDER — HEPARIN, PORCINE (PF) 100 UNIT/ML IV SYRINGE
100 unit/mL | INTRAVENOUS | Status: DC | PRN
Start: 2016-06-01 — End: 2016-06-04
  Administered 2016-06-01: 19:00:00

## 2016-06-01 MED ORDER — SODIUM CHLORIDE 0.9 % IJ SYRG
INTRAMUSCULAR | Status: DC | PRN
Start: 2016-06-01 — End: 2016-06-04
  Administered 2016-06-01: 19:00:00 via INTRAVENOUS

## 2016-06-01 MED ORDER — ERTAPENEM 1 GRAM SOLUTION FOR INJECTION
1 gram | Freq: Once | INTRAMUSCULAR | Status: AC
Start: 2016-06-01 — End: 2016-06-01
  Administered 2016-06-01: 18:00:00 via INTRAVENOUS

## 2016-06-01 MED FILL — HEPARIN LOCK FLUSH (PORCINE) (PF) 100 UNIT/ML INTRAVENOUS SYRINGE: 100 unit/mL | INTRAVENOUS | Qty: 5

## 2016-06-01 MED FILL — INVANZ 1 GRAM SOLUTION FOR INJECTION: 1 gram | INTRAMUSCULAR | Qty: 1

## 2016-06-01 MED FILL — BD POSIFLUSH NORMAL SALINE 0.9 % INJECTION SYRINGE: INTRAMUSCULAR | Qty: 40

## 2016-06-01 NOTE — Progress Notes (Signed)
[facility] Progress Note    Date: June 01, 2016    Name: Denise DickerJanet Jo [name]    MRN: [id number]         DOB: 15-Jun-1951    [facility]    [name] [facility] [place], ambulatory at 1345 . Pt was assessed and education was provided.     [name] vitals were reviewed.  Visit Vitals   ??? BP 129/57 (BP 1 Location: Left arm, BP Patient Position: Sitting)   ??? Pulse 74   ??? Temp 97.8 ??F (36.6 ??C)   ??? Resp 18   ??? SpO2 99%       RIght upper arm PICC flushed easily and had brisk blood return via both ports.     PICC dressing c/d/i and not due to be changed. No swelling, redness, streaking, warmth or drainage noted in arm. Pt denied c/o pain in arm.        Vancomycin       Invanz 1 gram       Cubicin       Rocephin    was infused at 100 ml/hr (over approximately 30 minutes).        [name] tolerated infusion, and had no complaints at this time.    Both lumens of PICC flushed with NS 10 ml and Heparin 250 units. Lumens wrapped with guaze and paper tape. Stockinette placed over dressing for protection.    Patient armband removed and shredded.    [name] [facility] in stable condition at 1435. She is to return on 06/02/16 at 1330 for her next antibiotic appointment.    [name], RN  June 01, 2016

## 2016-06-02 ENCOUNTER — Inpatient Hospital Stay: Admit: 2016-06-02 | Payer: Self-pay | Primary: Family Medicine

## 2016-06-02 MED ORDER — SODIUM CHLORIDE 0.9 % IJ SYRG
INTRAMUSCULAR | Status: DC | PRN
Start: 2016-06-02 — End: 2016-06-06
  Administered 2016-06-02 (×2): via INTRAVENOUS

## 2016-06-02 MED ORDER — ERTAPENEM 1 GRAM SOLUTION FOR INJECTION
1 gram | Freq: Once | INTRAMUSCULAR | Status: AC
Start: 2016-06-02 — End: 2016-06-02
  Administered 2016-06-02: 18:00:00 via INTRAVENOUS

## 2016-06-02 MED ORDER — HEPARIN, PORCINE (PF) 100 UNIT/ML IV SYRINGE
100 unit/mL | Freq: Once | INTRAVENOUS | Status: AC
Start: 2016-06-02 — End: 2016-06-02
  Administered 2016-06-02: 18:00:00

## 2016-06-02 MED FILL — INVANZ 1 GRAM SOLUTION FOR INJECTION: 1 gram | INTRAMUSCULAR | Qty: 1

## 2016-06-02 MED FILL — BD POSIFLUSH NORMAL SALINE 0.9 % INJECTION SYRINGE: INTRAMUSCULAR | Qty: 10

## 2016-06-02 NOTE — Progress Notes (Signed)
[facility] Progress Note    Date: June 02, 2016    Name: Verl DickerJanet Jo Abalos    MRN: [id number]         DOB: [birth year]      [facility] [employer]    [name] was assessed and education was provided. Patient denies any new complaints.    [name] vitals were reviewed.  Visit Vitals   ??? BP 131/59 (BP 1 Location: Left arm, BP Patient Position: Sitting)   ??? Pulse 73   ??? Temp 98.2 ??F (36.8 ??C)   ??? Resp 18   ??? SpO2 97%       Pt with Right upper arm double lumen PICC line.  Each lumen flushes without difficulty and positive for blood return.    No redness, bruising, or swelling noted at site and/ or extremity.  Pt denies tenderness.    Invanz 1 g was administered as ordered via purple lumen followed by normal saline flush.  PICC dressing changed per order. New transparent dressing, stat lock, chlorhexidine disk, and lumen ends applied.    [name] tolerated well without complaints.    Flushed each lumen of pt's PICC line with heparin per order.  Lumens wrapped in gauze and paper tape.  [name] [facility] in stable condition at 1415.  She is to return on June 03, 2016 at 1330 for her next appointment.    Laurice RecordElise M Williams, RN  June 02, 2016

## 2016-06-03 ENCOUNTER — Inpatient Hospital Stay: Admit: 2016-06-03 | Payer: Self-pay | Primary: Family Medicine

## 2016-06-03 MED ORDER — HEPARIN, PORCINE (PF) 100 UNIT/ML IV SYRINGE
100 unit/mL | Freq: Once | INTRAVENOUS | Status: AC
Start: 2016-06-03 — End: 2016-06-03
  Administered 2016-06-03: 18:00:00

## 2016-06-03 MED ORDER — SODIUM CHLORIDE 0.9 % IJ SYRG
INTRAMUSCULAR | Status: DC | PRN
Start: 2016-06-03 — End: 2016-06-07
  Administered 2016-06-03: 18:00:00 via INTRAVENOUS

## 2016-06-03 MED ORDER — ERTAPENEM 1 GRAM SOLUTION FOR INJECTION
1 gram | Freq: Once | INTRAMUSCULAR | Status: AC
Start: 2016-06-03 — End: 2016-06-03
  Administered 2016-06-03: 18:00:00 via INTRAVENOUS

## 2016-06-03 MED FILL — HEPARIN LOCK FLUSH (PORCINE) (PF) 100 UNIT/ML INTRAVENOUS SYRINGE: 100 unit/mL | INTRAVENOUS | Qty: 5

## 2016-06-03 MED FILL — BD POSIFLUSH NORMAL SALINE 0.9 % INJECTION SYRINGE: INTRAMUSCULAR | Qty: 40

## 2016-06-03 MED FILL — INVANZ 1 GRAM SOLUTION FOR INJECTION: 1 gram | INTRAMUSCULAR | Qty: 1

## 2016-06-03 NOTE — Progress Notes (Signed)
[facility] Progress Note    Date: June 03, 2016    Name: Denise DickerJanet Jo [name]    MRN: [id number]         DOB: [birth year]    [facility]    [name] 1330 . Pt was assessed and education was provided.     Denise [name]'s vitals were reviewed.  Visit Vitals   ??? BP 113/55 (BP 1 Location: Left arm, BP Patient Position: Sitting)   ??? Pulse 60   ??? Temp 98.1 ??F (36.7 ??C)   ??? Resp 18   ??? SpO2 97%       Right upper arm PICC flushed easily and had brisk blood return via both ports.     PICC dressing c/d/i and not due to be changed. No swelling, redness, streaking, warmth or drainage noted in arm. Pt denied c/o pain in arm.        Vancomycin       Invanz 1 gram       Cubicin       Rocephin    was infused at 100 ml/hr (over approximately 30 minutes).        Denise [name] tolerated infusion, and had no complaints at this time.    Both lumens of PICC flushed with NS 10 ml and Heparin 250 units. Lumens wrapped with guaze and paper tape. Stockinette placed over dressing for protection.    Patient armband removed and shredded.    Denise [name] was [facility] in stable condition at 1425. She is to return on 06/04/16 at 1000 for her next antibiotic appointment.    Denise EvesKimberly A Dettwiller, RN  June 03, 2016

## 2016-06-04 ENCOUNTER — Inpatient Hospital Stay: Admit: 2016-06-04 | Payer: Self-pay | Primary: Family Medicine

## 2016-06-04 DIAGNOSIS — J851 Abscess of lung with pneumonia: Secondary | ICD-10-CM

## 2016-06-04 MED ORDER — ERTAPENEM 1 GRAM SOLUTION FOR INJECTION
1 gram | Freq: Once | INTRAMUSCULAR | Status: AC
Start: 2016-06-04 — End: 2016-06-04
  Administered 2016-06-04: 14:00:00 via INTRAVENOUS

## 2016-06-04 MED ORDER — HEPARIN, PORCINE (PF) 100 UNIT/ML IV SYRINGE
100 unit/mL | Freq: Once | INTRAVENOUS | Status: AC
Start: 2016-06-04 — End: 2016-06-04
  Administered 2016-06-04: 15:00:00

## 2016-06-04 MED ORDER — SODIUM CHLORIDE 0.9 % IJ SYRG
INTRAMUSCULAR | Status: DC | PRN
Start: 2016-06-04 — End: 2016-06-08
  Administered 2016-06-04: 15:00:00 via INTRAVENOUS

## 2016-06-04 MED FILL — HEPARIN LOCK FLUSH (PORCINE) (PF) 100 UNIT/ML INTRAVENOUS SYRINGE: 100 unit/mL | INTRAVENOUS | Qty: 5

## 2016-06-04 MED FILL — INVANZ 1 GRAM SOLUTION FOR INJECTION: 1 gram | INTRAMUSCULAR | Qty: 1

## 2016-06-04 MED FILL — BD POSIFLUSH NORMAL SALINE 0.9 % INJECTION SYRINGE: INTRAMUSCULAR | Qty: 10

## 2016-06-04 NOTE — Progress Notes (Signed)
[facility] Progress Note    Date: June 04, 2016    Name: Denise [name]    MRN: [id number]         DOB: [birth year]    Denise [name] was assessed and education was provided.     Denise [name]'s vitals were reviewed.  Visit Vitals   ??? Breastfeeding No       Lab results were obtained and reviewed.  No results found for this or any previous visit (from the past 12 hour(s)).          Vancomycin       Invanz 1 gram IV       Cubicin       Rocephin    was infused at  100 ml/hr.    Denise [name] tolerated infusion, and had no complaints at this time.  Patient armband removed and shredded.    Denise [name] was [facility] in stable condition at 1050. She is to return on 06/05/16 for her next appointment.    Denise LoanLisa M Creighton-Bey, RN  June 04, 2016  9:59 AM

## 2016-06-05 ENCOUNTER — Inpatient Hospital Stay: Admit: 2016-06-05 | Payer: Self-pay | Primary: Family Medicine

## 2016-06-05 MED ORDER — HEPARIN, PORCINE (PF) 100 UNIT/ML IV SYRINGE
100 unit/mL | Freq: Once | INTRAVENOUS | Status: AC
Start: 2016-06-05 — End: 2016-06-05

## 2016-06-05 MED ORDER — ERTAPENEM 1 GRAM SOLUTION FOR INJECTION
1 gram | Freq: Once | INTRAMUSCULAR | Status: AC
Start: 2016-06-05 — End: 2016-06-05
  Administered 2016-06-05: 14:00:00 via INTRAVENOUS

## 2016-06-05 MED ORDER — SODIUM CHLORIDE 0.9 % IJ SYRG
INTRAMUSCULAR | Status: DC | PRN
Start: 2016-06-05 — End: 2016-06-09

## 2016-06-05 MED FILL — BD POSIFLUSH NORMAL SALINE 0.9 % INJECTION SYRINGE: INTRAMUSCULAR | Qty: 10

## 2016-06-05 MED FILL — HEPARIN LOCK FLUSH (PORCINE) (PF) 100 UNIT/ML INTRAVENOUS SYRINGE: 100 unit/mL | INTRAVENOUS | Qty: 5

## 2016-06-05 MED FILL — INVANZ 1 GRAM SOLUTION FOR INJECTION: 1 gram | INTRAMUSCULAR | Qty: 1

## 2016-06-05 NOTE — Progress Notes (Signed)
[facility] Progress Note    Date: June 05, 2016    Name: Denise DickerJanet Jo [name]    MRN: [id number]         DOB: August 07, 1951    [name] was assessed and education was provided.     [name] vitals were reviewed.  Visit Vitals   ??? BP 131/68 (BP 1 Location: Left arm, BP Patient Position: Sitting)   ??? Pulse 67   ??? Temp 98.1 ??F (36.7 ??C)   ??? Resp 18   ??? SpO2 98%       Lab results were obtained and reviewed.  No results found for this or any previous visit (from the past 12 hour(s)).          Vancomycin       Invanz 1 gram IV       Cubicin       Rocephin    was infused at  100 ml/hr.    [name] tolerated infusion, and had no complaints at this time.  Patient armband removed and shredded.    [name] [facility] in stable condition at 1040. She is to return on 06/06/16 for her next appointment.    Mare LoanLisa M Creighton-Bey, RN  June 05, 2016  10:09 AM

## 2016-06-06 ENCOUNTER — Inpatient Hospital Stay: Admit: 2016-06-06 | Payer: Self-pay | Primary: Family Medicine

## 2016-06-06 LAB — METABOLIC PANEL, COMPREHENSIVE
A-G Ratio: 0.7 — ABNORMAL LOW (ref 0.8–1.7)
ALT (SGPT): 36 U/L (ref 13–56)
AST (SGOT): 32 U/L (ref 15–37)
Albumin: 2.9 g/dL — ABNORMAL LOW (ref 3.4–5.0)
Alk. phosphatase: 88 U/L (ref 45–117)
Anion gap: 8 mmol/L (ref 3.0–18)
BUN/Creatinine ratio: 21 — ABNORMAL HIGH (ref 12–20)
BUN: 15 MG/DL (ref 7.0–18)
Bilirubin, total: 0.4 MG/DL (ref 0.2–1.0)
CO2: 29 mmol/L (ref 21–32)
Calcium: 8.5 MG/DL (ref 8.5–10.1)
Chloride: 102 mmol/L (ref 100–108)
Creatinine: 0.73 MG/DL (ref 0.6–1.3)
GFR est AA: >60 mL/min/{1.73_m2} (ref >60)
GFR est non-AA: >60 mL/min/{1.73_m2} (ref >60)
Globulin: 4.1 g/dL — ABNORMAL HIGH (ref 2.0–4.0)
Glucose: 139 mg/dL — ABNORMAL HIGH (ref 74–99)
Potassium: 4 mmol/L (ref 3.5–5.5)
Protein, total: 7 g/dL (ref 6.4–8.2)
Sodium: 139 mmol/L (ref 136–145)

## 2016-06-06 LAB — CBC WITH AUTOMATED DIFF
ABS. BASOPHILS: 0.1 10*3/uL — ABNORMAL HIGH (ref 0.0–0.06)
ABS. EOSINOPHILS: 0.4 10*3/uL (ref 0.0–0.4)
ABS. LYMPHOCYTES: 2.3 10*3/uL (ref 0.9–3.6)
ABS. MONOCYTES: 0.4 10*3/uL (ref 0.05–1.2)
ABS. NEUTROPHILS: 3.2 10*3/uL (ref 1.8–8.0)
BASOPHILS: 1 % (ref 0–2)
EOSINOPHILS: 6 % — ABNORMAL HIGH (ref 0–5)
HCT: 36.1 % (ref 35.0–45.0)
HGB: 12.2 g/dL (ref 12.0–16.0)
LYMPHOCYTES: 37 % (ref 21–52)
MCH: 31.6 PG (ref 24.0–34.0)
MCHC: 33.8 g/dL (ref 31.0–37.0)
MCV: 93.5 FL (ref 74.0–97.0)
MONOCYTES: 6 % (ref 3–10)
MPV: 10 FL (ref 9.2–11.8)
NEUTROPHILS: 50 % (ref 40–73)
PLATELET: 261 10*3/uL (ref 135–420)
RBC: 3.86 M/uL — ABNORMAL LOW (ref 4.20–5.30)
RDW: 13.6 % (ref 11.6–14.5)
WBC: 6.3 10*3/uL (ref 4.6–13.2)

## 2016-06-06 LAB — CULTURE, FUNGUS: FUNGUS SMEAR: NONE SEEN

## 2016-06-06 MED ORDER — SODIUM CHLORIDE 0.9 % IJ SYRG
INTRAMUSCULAR | Status: DC | PRN
Start: 2016-06-06 — End: 2016-06-10
  Administered 2016-06-06 (×2): via INTRAVENOUS

## 2016-06-06 MED ORDER — HEPARIN, PORCINE (PF) 100 UNIT/ML IV SYRINGE
100 unit/mL | Freq: Once | INTRAVENOUS | Status: AC
Start: 2016-06-06 — End: 2016-06-06
  Administered 2016-06-06: 18:00:00

## 2016-06-06 MED ORDER — ERTAPENEM 1 GRAM SOLUTION FOR INJECTION
1 gram | Freq: Once | INTRAMUSCULAR | Status: AC
Start: 2016-06-06 — End: 2016-06-06
  Administered 2016-06-06: 18:00:00 via INTRAVENOUS

## 2016-06-06 MED FILL — HEPARIN LOCK FLUSH (PORCINE) (PF) 100 UNIT/ML INTRAVENOUS SYRINGE: 100 unit/mL | INTRAVENOUS | Qty: 5

## 2016-06-06 MED FILL — INVANZ 1 GRAM SOLUTION FOR INJECTION: 1 gram | INTRAMUSCULAR | Qty: 1

## 2016-06-06 MED FILL — BD POSIFLUSH NORMAL SALINE 0.9 % INJECTION SYRINGE: INTRAMUSCULAR | Qty: 10

## 2016-06-06 NOTE — Progress Notes (Signed)
[facility] Progress Note    Date: June 06, 2016    Name: Denise [name]    MRN: [id number]         DOB: 1951/11/21      [name]    Denise [name] was assessed and education was provided. Patient states she has had increased post nasal drip and throat clearing over the past few days.     Denise [name]'s vitals were reviewed.  Visit Vitals   ??? BP 104/61 (BP 1 Location: Left arm, BP Patient Position: Sitting)   ??? Pulse 76   ??? Temp 97.6 ??F (36.4 ??C)   ??? Resp 18   ??? SpO2 98%       Pt with Right upper arm double lumen PICC line.  Each lumen flushes without difficulty and positive for blood return.  Dressing CDI.  No redness, bruising, or swelling noted at site and/ or extremity.  Pt denies tenderness.  Blood drawn for labs via red lumen.   Labs obtained and reviewed.  Recent Results (from the past 12 hour(s))   CBC WITH AUTOMATED DIFF    Collection Time: 06/06/16  1:30 PM   Result Value Ref Range    WBC 6.3 4.6 - 13.2 K/uL    RBC 3.86 (L) 4.20 - 5.30 M/uL    HGB 12.2 12.0 - 16.0 g/dL    HCT 40.936.1 81.135.0 - 91.445.0 %    MCV 93.5 74.0 - 97.0 FL    MCH 31.6 24.0 - 34.0 PG    MCHC 33.8 31.0 - 37.0 g/dL    RDW 78.213.6 95.611.6 - 21.314.5 %    PLATELET 261 135 - 420 K/uL    MPV 10.0 9.2 - 11.8 FL    NEUTROPHILS 50 40 - 73 %    LYMPHOCYTES 37 21 - 52 %    MONOCYTES 6 3 - 10 %    EOSINOPHILS 6 (H) 0 - 5 %    BASOPHILS 1 0 - 2 %    ABS. NEUTROPHILS 3.2 1.8 - 8.0 K/UL    ABS. LYMPHOCYTES 2.3 0.9 - 3.6 K/UL    ABS. MONOCYTES 0.4 0.05 - 1.2 K/UL    ABS. EOSINOPHILS 0.4 0.0 - 0.4 K/UL    ABS. BASOPHILS 0.1 (H) 0.0 - 0.06 K/UL    DF AUTOMATED         Invanz 1 g was administered as ordered via purple lumen followed by normal saline flush.    Denise [name] tolerated well without complaints.    Flushed each lumen of pt's PICC line with heparin per order. Lumens wrapped in gauze and paper tape.  [name] condition at 1420.  She is to return on June 07, 2016 at 930 for her next appointment.     Laurice RecordElise M Williams, RN  June 06, 2016

## 2016-06-07 ENCOUNTER — Inpatient Hospital Stay: Admit: 2016-06-07 | Payer: Self-pay | Primary: Family Medicine

## 2016-06-07 MED ORDER — SODIUM CHLORIDE 0.9 % IV PIGGY BACK
1 gram | Freq: Once | INTRAVENOUS | Status: AC
Start: 2016-06-07 — End: 2016-06-07
  Administered 2016-06-07: 14:00:00 via INTRAVENOUS

## 2016-06-07 MED ORDER — SODIUM CHLORIDE 0.9 % IJ SYRG
INTRAMUSCULAR | Status: DC | PRN
Start: 2016-06-07 — End: 2016-06-11
  Administered 2016-06-07 (×2): via INTRAVENOUS

## 2016-06-07 MED ORDER — HEPARIN, PORCINE (PF) 100 UNIT/ML IV SYRINGE
100 unit/mL | Freq: Once | INTRAVENOUS | Status: AC
Start: 2016-06-07 — End: 2016-06-07
  Administered 2016-06-07: 14:00:00

## 2016-06-07 MED FILL — BD POSIFLUSH NORMAL SALINE 0.9 % INJECTION SYRINGE: INTRAMUSCULAR | Qty: 40

## 2016-06-07 MED FILL — INVANZ 1 GRAM SOLUTION FOR INJECTION: 1 gram | INTRAMUSCULAR | Qty: 1

## 2016-06-07 MED FILL — HEPARIN LOCK FLUSH (PORCINE) (PF) 100 UNIT/ML INTRAVENOUS SYRINGE: 100 unit/mL | INTRAVENOUS | Qty: 5

## 2016-06-07 NOTE — Progress Notes (Signed)
[facility] Progress Note    Date: June 07, 2016    Name: Verl DickerJanet Jo Kinner    MRN: [id number]         DOB: 1951-09-02    [name] was assessed and education was provided.     [name] vitals were reviewed.  Visit Vitals   ??? BP 111/64 (BP 1 Location: Left arm, BP Patient Position: Sitting)   ??? Pulse 72   ??? Temp 97.3 ??F (36.3 ??C)   ??? Resp 18   ??? SpO2 98%           Vancomycin       Invanz 1 gram IV       Cubicin       Rocephin    was infused at  100 ml/hr.    [name] tolerated infusion, and had no complaints at this time.  Patient armband removed and shredded.    [name] in stable condition at 1020. She is to return on 06/08/16 for her next appointment.    Monroe Toure  June 07, 2016  10:09 AM

## 2016-06-08 ENCOUNTER — Inpatient Hospital Stay: Admit: 2016-06-08 | Payer: Self-pay | Primary: Family Medicine

## 2016-06-08 ENCOUNTER — Encounter

## 2016-06-08 MED ORDER — HEPARIN, PORCINE (PF) 100 UNIT/ML IV SYRINGE
100 unit/mL | Freq: Once | INTRAVENOUS | Status: AC
Start: 2016-06-08 — End: 2016-06-08
  Administered 2016-06-08: 15:00:00

## 2016-06-08 MED ORDER — SODIUM CHLORIDE 0.9 % IV PIGGY BACK
1 gram | Freq: Once | INTRAVENOUS | Status: AC
Start: 2016-06-08 — End: 2016-06-08
  Administered 2016-06-08: 14:00:00 via INTRAVENOUS

## 2016-06-08 MED ORDER — SODIUM CHLORIDE 0.9 % IJ SYRG
INTRAMUSCULAR | Status: DC | PRN
Start: 2016-06-08 — End: 2016-06-12
  Administered 2016-06-08: 15:00:00 via INTRAVENOUS

## 2016-06-08 MED FILL — HEPARIN LOCK FLUSH (PORCINE) (PF) 100 UNIT/ML INTRAVENOUS SYRINGE: 100 unit/mL | INTRAVENOUS | Qty: 5

## 2016-06-08 MED FILL — INVANZ 1 GRAM SOLUTION FOR INJECTION: 1 gram | INTRAMUSCULAR | Qty: 1

## 2016-06-08 MED FILL — BD POSIFLUSH NORMAL SALINE 0.9 % INJECTION SYRINGE: INTRAMUSCULAR | Qty: 40

## 2016-06-08 NOTE — Progress Notes (Signed)
[facility] Progress Note    Date: June 08, 2016    Name: Denise DickerJanet Jo [name]    MRN: [id number]         DOB: [birth year]    [facility]    [name] [facility], ambulatory at 1010 . Pt was assessed and education was provided.     [name] vitals were reviewed.  Visit Vitals   ??? BP 119/73 (BP 1 Location: Left arm, BP Patient Position: Sitting)   ??? Pulse 69   ??? Temp 97.3 ??F (36.3 ??C)   ??? Resp 18   ??? SpO2 100%       Right upper arm PICC flushed easily and had brisk blood return via both ports.     PICC dressing c/d/i and not due to be changed. No swelling, redness, streaking, warmth or drainage noted in arm. Pt denied c/o pain in arm.        Vancomycin       Invanz 1 gram       Cubicin       Rocephin    was infused at 100 ml/hr (over approximately 30 minutes).        [name] tolerated infusion, and had no complaints at this time.    Both lumens of PICC flushed with NS 10 ml and Heparin 250 units. Lumens wrapped with guaze and paper tape. Stockinette placed over dressing for protection.    Patient armband removed and shredded.    [name] [facility] in stable condition at 1100. She is to return on 06/09/16 at 1330 for her next antibiotic appointment.    [name], RN  June 08, 2016

## 2016-06-09 ENCOUNTER — Inpatient Hospital Stay: Admit: 2016-06-09 | Payer: Self-pay | Primary: Family Medicine

## 2016-06-09 MED ORDER — HEPARIN, PORCINE (PF) 100 UNIT/ML IV SYRINGE
100 unit/mL | Freq: Once | INTRAVENOUS | Status: AC
Start: 2016-06-09 — End: 2016-06-09
  Administered 2016-06-09: 18:00:00

## 2016-06-09 MED ORDER — SODIUM CHLORIDE 0.9 % IJ SYRG
INTRAMUSCULAR | Status: DC | PRN
Start: 2016-06-09 — End: 2016-06-13
  Administered 2016-06-09: 18:00:00 via INTRAVENOUS

## 2016-06-09 MED ORDER — ERTAPENEM 1 GRAM SOLUTION FOR INJECTION
1 gram | Freq: Once | INTRAMUSCULAR | Status: AC
Start: 2016-06-09 — End: 2016-06-09
  Administered 2016-06-09: 18:00:00 via INTRAVENOUS

## 2016-06-09 MED FILL — INVANZ 1 GRAM SOLUTION FOR INJECTION: 1 gram | INTRAMUSCULAR | Qty: 1

## 2016-06-09 MED FILL — BD POSIFLUSH NORMAL SALINE 0.9 % INJECTION SYRINGE: INTRAMUSCULAR | Qty: 10

## 2016-06-09 MED FILL — HEPARIN LOCK FLUSH (PORCINE) (PF) 100 UNIT/ML INTRAVENOUS SYRINGE: 100 unit/mL | INTRAVENOUS | Qty: 5

## 2016-06-09 NOTE — Progress Notes (Signed)
[facility] Progress Note    Date: June 09, 2016    Name: Denise DickerJanet Jo [name]    MRN: [id number]         DOB: Apr 21, 1951    [name] was assessed and education was provided.     [name] vitals were reviewed.  Visit Vitals   ??? BP 142/80 (BP 1 Location: Left arm, BP Patient Position: Sitting)   ??? Pulse 68   ??? Temp 98.1 ??F (36.7 ??C)   ??? Resp 18   ??? SpO2 96%       Lab results were obtained and reviewed.  No results found for this or any previous visit (from the past 12 hour(s)).          Vancomycin       Invanz 1 gram IV       Cubicin       Rocephin    was infused at  100 ml/hr.    [name] tolerated infusion, and had no complaints at this time.      PICC lined dressing changed per orders, per protocol without complications.  [name] tolerated well.    Patient armband removed and shredded.    [name] from [facility] in stable condition at 1430. She is to return on 06/10/16 for her next appointment.    Mare LoanLisa M Creighton-Bey, RN  June 09, 2016  2:05 PM

## 2016-06-10 ENCOUNTER — Inpatient Hospital Stay: Admit: 2016-06-10 | Payer: Self-pay | Primary: Family Medicine

## 2016-06-10 MED ORDER — HEPARIN, PORCINE (PF) 100 UNIT/ML IV SYRINGE
100 unit/mL | Freq: Once | INTRAVENOUS | Status: AC
Start: 2016-06-10 — End: 2016-06-10
  Administered 2016-06-10: 18:00:00

## 2016-06-10 MED ORDER — SODIUM CHLORIDE 0.9 % IJ SYRG
INTRAMUSCULAR | Status: DC | PRN
Start: 2016-06-10 — End: 2016-06-14
  Administered 2016-06-10: 18:00:00 via INTRAVENOUS

## 2016-06-10 MED ORDER — SODIUM CHLORIDE 0.9 % IV PIGGY BACK
1 gram | Freq: Once | INTRAVENOUS | Status: AC
Start: 2016-06-10 — End: 2016-06-10
  Administered 2016-06-10: 18:00:00 via INTRAVENOUS

## 2016-06-10 MED FILL — BD POSIFLUSH NORMAL SALINE 0.9 % INJECTION SYRINGE: INTRAMUSCULAR | Qty: 10

## 2016-06-10 MED FILL — INVANZ 1 GRAM SOLUTION FOR INJECTION: 1 gram | INTRAMUSCULAR | Qty: 1

## 2016-06-10 MED FILL — HEPARIN LOCK FLUSH (PORCINE) (PF) 100 UNIT/ML INTRAVENOUS SYRINGE: 100 unit/mL | INTRAVENOUS | Qty: 5

## 2016-06-11 ENCOUNTER — Inpatient Hospital Stay: Admit: 2016-06-11 | Payer: Self-pay | Primary: Family Medicine

## 2016-06-11 MED ORDER — ERTAPENEM 1 GRAM SOLUTION FOR INJECTION
1 gram | Freq: Once | INTRAMUSCULAR | Status: AC
Start: 2016-06-11 — End: 2016-06-11
  Administered 2016-06-11: 14:00:00 via INTRAVENOUS

## 2016-06-11 MED ORDER — HEPARIN, PORCINE (PF) 100 UNIT/ML IV SYRINGE
100 unit/mL | Freq: Once | INTRAVENOUS | Status: AC
Start: 2016-06-11 — End: 2016-06-11
  Administered 2016-06-11: 15:00:00

## 2016-06-11 MED ORDER — SODIUM CHLORIDE 0.9 % IJ SYRG
INTRAMUSCULAR | Status: DC | PRN
Start: 2016-06-11 — End: 2016-06-15
  Administered 2016-06-11 (×2): via INTRAVENOUS

## 2016-06-11 MED FILL — INVANZ 1 GRAM SOLUTION FOR INJECTION: 1 gram | INTRAMUSCULAR | Qty: 1

## 2016-06-11 MED FILL — BD POSIFLUSH NORMAL SALINE 0.9 % INJECTION SYRINGE: INTRAMUSCULAR | Qty: 40

## 2016-06-11 MED FILL — HEPARIN LOCK FLUSH (PORCINE) (PF) 100 UNIT/ML INTRAVENOUS SYRINGE: 100 unit/mL | INTRAVENOUS | Qty: 5

## 2016-06-11 NOTE — Progress Notes (Signed)
[facility] Progress Note    Date: June 11, 2016    Name: Denise [name]    MRN: [id number]         DOB: November 14, 1951    [facility]    [name]. Pt was assessed and education was provided.   Patient denies pain today, but states she is having soft, loose stools since beginning antibiotics. Instructed her to drink plenty of water to avoid dehydration.   She denies any skin rashes or hives since beginning Invanz.    Denise [name]'s vitals were reviewed.  Visit Vitals   ??? BP 115/60 (BP 1 Location: Left arm, BP Patient Position: Sitting)   ??? Pulse 70   ??? Temp 98 ??F (36.7 ??C)   ??? Resp 18   ??? SpO2 96%       Right upper arm PICC flushed easily and had brisk blood return via both ports.     PICC dressing c/d/i and not due to be changed. No swelling, redness, streaking, warmth or drainage noted in arm. Pt denied c/o pain in arm.        Vancomycin       Invanz 1 gm       Cubicin       Rocephin    was infused at 100 ml/hr (over approximately 30 minutes).    Denise [name] tolerated infusion, and had no complaints at this time.    Both lumens of PICC flushed with NS 10 ml and Heparin 250 units. Lumens wrapped with guaze and paper tape. Stockinette placed over dressing for protection.    Patient armband removed and shredded.    Denise [name] was [facility] in stable condition at 1050. She is to return on 06/12/16 at 0900 for her next antibiotic appointment.    Francoise CeoJennifer Tyeasha Ebbs, RN  June 11, 2016

## 2016-06-12 ENCOUNTER — Inpatient Hospital Stay: Admit: 2016-06-12 | Payer: Self-pay | Primary: Family Medicine

## 2016-06-12 MED ORDER — SODIUM CHLORIDE 0.9 % IV PIGGY BACK
1 gram | Freq: Once | INTRAVENOUS | Status: AC
Start: 2016-06-12 — End: 2016-06-12
  Administered 2016-06-12: 13:00:00 via INTRAVENOUS

## 2016-06-12 MED ORDER — HEPARIN, PORCINE (PF) 100 UNIT/ML IV SYRINGE
100 unit/mL | Freq: Once | INTRAVENOUS | Status: AC
Start: 2016-06-12 — End: 2016-06-12
  Administered 2016-06-12: 14:00:00

## 2016-06-12 MED ORDER — SODIUM CHLORIDE 0.9 % IJ SYRG
INTRAMUSCULAR | Status: DC | PRN
Start: 2016-06-12 — End: 2016-06-16
  Administered 2016-06-12 (×2): via INTRAVENOUS

## 2016-06-12 MED FILL — BD POSIFLUSH NORMAL SALINE 0.9 % INJECTION SYRINGE: INTRAMUSCULAR | Qty: 40

## 2016-06-12 MED FILL — HEPARIN LOCK FLUSH (PORCINE) (PF) 100 UNIT/ML INTRAVENOUS SYRINGE: 100 unit/mL | INTRAVENOUS | Qty: 5

## 2016-06-12 MED FILL — INVANZ 1 GRAM SOLUTION FOR INJECTION: 1 gram | INTRAMUSCULAR | Qty: 1

## 2016-06-12 NOTE — Progress Notes (Signed)
[facility] Progress Note    Date: June 12, 2016    Name: Denise [name]    MRN: [id number]         DOB: [birth year]    [facility]    [name] at 0900. Pt was assessed and education was provided.   She denies new complaints today and denies any skin rashes or hives. She is still having soft stools.    Denise [name]'s vitals were reviewed.  Visit Vitals   ??? BP 123/71 (BP 1 Location: Left arm, BP Patient Position: Sitting)   ??? Pulse 70   ??? Temp 97.8 ??F (36.6 ??C)   ??? Resp 18   ??? SpO2 96%       Right upper arm PICC flushed easily and had brisk blood return via both ports.     PICC dressing c/d/i and not due to be changed. No swelling, redness, streaking, warmth or drainage noted in arm. Pt denied c/o pain in arm.        Vancomycin       Invanz 1 gm       Cubicin       Rocephin    was infused at 100 ml/hr (over approximately 30 minutes).    Denise [name] tolerated infusion, and had no complaints at this time.    Both lumens of PICC flushed with NS 10 ml and Heparin 250 units. Lumens wrapped with guaze and paper tape. Stockinette placed over dressing for protection.    Patient armband removed and shredded.    Denise [name] was discharged [facility] in stable condition at 0950. She is to return on 06/13/16 at 1300 for her next antibiotic appointment.    Francoise CeoJennifer Shelvy Heckert, RN  June 12, 2016

## 2016-06-13 ENCOUNTER — Inpatient Hospital Stay: Admit: 2016-06-13 | Payer: Self-pay | Primary: Family Medicine

## 2016-06-13 LAB — CBC WITH AUTOMATED DIFF
ABS. BASOPHILS: 0.1 10*3/uL — ABNORMAL HIGH (ref 0.0–0.06)
ABS. EOSINOPHILS: 0.3 10*3/uL (ref 0.0–0.4)
ABS. LYMPHOCYTES: 2.5 10*3/uL (ref 0.9–3.6)
ABS. MONOCYTES: 0.4 10*3/uL (ref 0.05–1.2)
ABS. NEUTROPHILS: 2.6 10*3/uL (ref 1.8–8.0)
BASOPHILS: 1 % (ref 0–2)
EOSINOPHILS: 5 % (ref 0–5)
HCT: 36.3 % (ref 35.0–45.0)
HGB: 12.1 g/dL (ref 12.0–16.0)
LYMPHOCYTES: 43 % (ref 21–52)
MCH: 30.9 PG (ref 24.0–34.0)
MCHC: 33.3 g/dL (ref 31.0–37.0)
MCV: 92.8 FL (ref 74.0–97.0)
MONOCYTES: 7 % (ref 3–10)
MPV: 9.9 FL (ref 9.2–11.8)
NEUTROPHILS: 44 % (ref 40–73)
PLATELET: 232 10*3/uL (ref 135–420)
RBC: 3.91 M/uL — ABNORMAL LOW (ref 4.20–5.30)
RDW: 13.8 % (ref 11.6–14.5)
WBC: 5.9 10*3/uL (ref 4.6–13.2)

## 2016-06-13 LAB — METABOLIC PANEL, COMPREHENSIVE
A-G Ratio: 0.8 (ref 0.8–1.7)
ALT (SGPT): 32 U/L (ref 13–56)
AST (SGOT): 25 U/L (ref 15–37)
Albumin: 3 g/dL — ABNORMAL LOW (ref 3.4–5.0)
Alk. phosphatase: 88 U/L (ref 45–117)
Anion gap: 11 mmol/L (ref 3.0–18)
BUN/Creatinine ratio: 21 — ABNORMAL HIGH (ref 12–20)
BUN: 16 MG/DL (ref 7.0–18)
Bilirubin, total: 0.4 MG/DL (ref 0.2–1.0)
CO2: 27 mmol/L (ref 21–32)
Calcium: 9.1 MG/DL (ref 8.5–10.1)
Chloride: 102 mmol/L (ref 100–108)
Creatinine: 0.76 MG/DL (ref 0.6–1.3)
GFR est AA: >60 mL/min/{1.73_m2} (ref >60)
GFR est non-AA: >60 mL/min/{1.73_m2} (ref >60)
Globulin: 4 g/dL (ref 2.0–4.0)
Glucose: 141 mg/dL — ABNORMAL HIGH (ref 74–99)
Potassium: 4.1 mmol/L (ref 3.5–5.5)
Protein, total: 7 g/dL (ref 6.4–8.2)
Sodium: 140 mmol/L (ref 136–145)

## 2016-06-13 MED ORDER — HEPARIN, PORCINE (PF) 100 UNIT/ML IV SYRINGE
100 unit/mL | Freq: Once | INTRAVENOUS | Status: AC
Start: 2016-06-13 — End: 2016-06-13
  Administered 2016-06-13: 18:00:00

## 2016-06-13 MED ORDER — SODIUM CHLORIDE 0.9 % IJ SYRG
INTRAMUSCULAR | Status: DC | PRN
Start: 2016-06-13 — End: 2016-06-17
  Administered 2016-06-13 (×2): via INTRAVENOUS

## 2016-06-13 MED ORDER — SODIUM CHLORIDE 0.9 % IV PIGGY BACK
1 gram | Freq: Once | INTRAVENOUS | Status: AC
Start: 2016-06-13 — End: 2016-06-13
  Administered 2016-06-13: 17:00:00 via INTRAVENOUS

## 2016-06-13 MED FILL — HEPARIN LOCK FLUSH (PORCINE) (PF) 100 UNIT/ML INTRAVENOUS SYRINGE: 100 unit/mL | INTRAVENOUS | Qty: 5

## 2016-06-13 MED FILL — INVANZ 1 GRAM SOLUTION FOR INJECTION: 1 gram | INTRAMUSCULAR | Qty: 1

## 2016-06-13 MED FILL — BD POSIFLUSH NORMAL SALINE 0.9 % INJECTION SYRINGE: INTRAMUSCULAR | Qty: 10

## 2016-06-13 NOTE — Progress Notes (Signed)
[facility] Progress Note    Date: June 13, 2016    Name: Denise [name]    MRN: [id number]         DOB: 1951/08/19      [name]    [name] was assessed and education was provided.     [name] vitals were reviewed.  Visit Vitals   ??? BP 123/74 (BP 1 Location: Left arm, BP Patient Position: Sitting)   ??? Pulse 74   ??? Temp 98.2 ??F (36.8 ??C)   ??? Resp 18   ??? SpO2 96%       Pt with Right upper arm double lumen PICC line.  Each lumen flushes without difficulty and positive for blood return.  Dressing CDI.  No redness, bruising, or swelling noted at site and/ or extremity.  Pt denies tenderness.    Invanz 1 g was administered as ordered via purple lumen followed by normal saline flush.    [name] tolerated well without complaints.    Flushed each lumen of pt's PICC line with heparin per order.  Lumens wrapped in gauze and paper tape.    [name] [facility] in stable condition at 1405.  She is to return on June 14, 2016 at 1300 for her next appointment.    Laurice RecordElise M Williams, RN  June 13, 2016

## 2016-06-14 ENCOUNTER — Inpatient Hospital Stay: Admit: 2016-06-14 | Payer: Self-pay | Primary: Family Medicine

## 2016-06-14 MED ORDER — SODIUM CHLORIDE 0.9 % IJ SYRG
INTRAMUSCULAR | Status: DC | PRN
Start: 2016-06-14 — End: 2016-06-18
  Administered 2016-06-14 (×2): via INTRAVENOUS

## 2016-06-14 MED ORDER — SODIUM CHLORIDE 0.9 % IV PIGGY BACK
1 gram | Freq: Once | INTRAVENOUS | Status: AC
Start: 2016-06-14 — End: 2016-06-14
  Administered 2016-06-14: 17:00:00 via INTRAVENOUS

## 2016-06-14 MED FILL — INVANZ 1 GRAM SOLUTION FOR INJECTION: 1 gram | INTRAMUSCULAR | Qty: 1

## 2016-06-14 MED FILL — BD POSIFLUSH NORMAL SALINE 0.9 % INJECTION SYRINGE: INTRAMUSCULAR | Qty: 40

## 2016-06-14 NOTE — Progress Notes (Signed)
[facility] Progress Note    Date: June 14, 2016    Name: Verl DickerJanet Jo Choudhry    MRN: [id number]         DOB: [birth year]    [facility]--last dose & PICC d/c    [facility] at 1300. Pt was assessed and education was provided.       [name] vitals were reviewed.  Visit Vitals   ??? BP 122/69 (BP 1 Location: Left arm, BP Patient Position: Sitting)   ??? Pulse 72   ??? Temp 98.7 ??F (37.1 ??C)   ??? Resp 18   ??? SpO2 96%     Patient Vitals for the past 12 hrs:   Temp Pulse Resp BP SpO2   06/14/16 1434 - 63 18 122/79 97 %   06/14/16 1301 98.7 ??F (37.1 ??C) 72 18 122/69 96 %       Right upper arm PICC flushed easily and had brisk blood return via both ports.     No swelling, redness, streaking, warmth or drainage noted in arm. Pt denied c/o pain in arm.        Vancomycin       Invanz 1 gm       Cubicin       Rocephin    was infused at 100 ml/hr (over approximately 30 minutes).    [name] tolerated infusion, and had no complaints at this time.    Both lumens of PICC flushed with NS 10 ml.     Pt placed in recumbent position. PICC line removed after sterile site prepped per protocol. PICC catheter tip visualized and intact. Length of catheter 40 cm.   Pressure applied x 3 minutes and site covered with guaze and tegaderm.     Pt observed for 30 minutes post-removal while in recumbent position. No redness, ecchymosis, edema, swelling, bleeding, or drainage noted at site. BP stable before discharge.    Instructions provided on post PICC discharge care, including followup notification instructions. Pt given printed copy of discharge instructions.    Patient armband removed and shredded.    [name] from [facility] in stable condition at 1435. She is to follow-up with Dr. Guy SandiferSensenig this month.    Francoise CeoJennifer Kennetha Pearman, RN  June 14, 2016

## 2016-06-16 ENCOUNTER — Inpatient Hospital Stay: Admit: 2016-06-16 | Payer: Self-pay | Primary: Family Medicine

## 2016-06-16 ENCOUNTER — Encounter

## 2016-06-16 DIAGNOSIS — J851 Abscess of lung with pneumonia: Secondary | ICD-10-CM

## 2016-06-16 MED ORDER — IOPAMIDOL 61 % IV SOLN
300 mg iodine /mL (61 %) | Freq: Once | INTRAVENOUS | Status: AC
Start: 2016-06-16 — End: 2016-06-16
  Administered 2016-06-16: 18:00:00 via INTRAVENOUS

## 2016-06-16 MED FILL — ISOVUE-300  61 % INTRAVENOUS SOLUTION: 300 mg iodine /mL (61 %) | INTRAVENOUS | Qty: 100

## 2016-06-21 LAB — AFB CULTURE + SMEAR W/RFLX PCR AND ID
Acid Fast Culture: NEGATIVE
Acid Fast Smear: NEGATIVE

## 2016-07-12 MED FILL — SODIUM CHLORIDE 0.9 % IV PIGGY BACK: INTRAVENOUS | Qty: 50

## 2017-08-24 ENCOUNTER — Inpatient Hospital Stay: Admit: 2017-08-24 | Discharge: 2017-08-24 | Attending: Emergency Medicine

## 2017-08-24 ENCOUNTER — Inpatient Hospital Stay
Admit: 2017-08-24 | Discharge: 2017-08-24 | Disposition: A | Discharge status: Home / Self Care | Payer: MEDICARE | Attending: Emergency Medicine

## 2017-08-24 ENCOUNTER — Emergency Department: Admit: 2017-08-24 | Payer: MEDICARE | Primary: Family Medicine

## 2017-08-24 DIAGNOSIS — J441 Chronic obstructive pulmonary disease with (acute) exacerbation: Secondary | ICD-10-CM

## 2017-08-24 LAB — CBC WITH AUTOMATED DIFF
ABS. BASOPHILS: 0.1 10*3/uL (ref 0.0–0.1)
ABS. EOSINOPHILS: 0.3 10*3/uL (ref 0.0–0.4)
ABS. LYMPHOCYTES: 2.3 10*3/uL (ref 0.9–3.6)
ABS. MONOCYTES: 0.6 10*3/uL (ref 0.05–1.2)
ABS. NEUTROPHILS: 3.1 10*3/uL (ref 1.8–8.0)
BASOPHILS: 1 % (ref 0–2)
EOSINOPHILS: 5 % (ref 0–5)
HCT: 42.1 % (ref 35.0–45.0)
HGB: 14.2 g/dL (ref 12.0–16.0)
LYMPHOCYTES: 36 % (ref 21–52)
MCH: 31.9 PG (ref 24.0–34.0)
MCHC: 33.7 g/dL (ref 31.0–37.0)
MCV: 94.6 FL (ref 74.0–97.0)
MONOCYTES: 10 % (ref 3–10)
MPV: 10.6 FL (ref 9.2–11.8)
NEUTROPHILS: 48 % (ref 40–73)
PLATELET: 209 10*3/uL (ref 135–420)
RBC: 4.45 M/uL (ref 4.20–5.30)
RDW: 13.5 % (ref 11.6–14.5)
WBC: 6.3 10*3/uL (ref 4.6–13.2)

## 2017-08-24 LAB — CARDIAC PANEL,(CK, CKMB & TROPONIN)
CK - MB: 1.5 ng/ml (ref <3.6)
CK-MB Index: 1.4 % (ref 0.0–4.0)
CK: 105 U/L (ref 26–192)
Troponin-I, QT: <0.02 NG/ML (ref 0.00–0.06)

## 2017-08-24 LAB — METABOLIC PANEL, COMPREHENSIVE
A-G Ratio: 0.9 (ref 0.8–1.7)
ALT (SGPT): 27 U/L (ref 13–56)
AST (SGOT): 22 U/L (ref 15–37)
Albumin: 3.5 g/dL (ref 3.4–5.0)
Alk. phosphatase: 98 U/L (ref 45–117)
Anion gap: 7 mmol/L (ref 3.0–18)
BUN/Creatinine ratio: 19 (ref 12–20)
BUN: 15 MG/DL (ref 7.0–18)
Bilirubin, total: 0.3 MG/DL (ref 0.2–1.0)
CO2: 30 mmol/L (ref 21–32)
Calcium: 8.7 MG/DL (ref 8.5–10.1)
Chloride: 105 mmol/L (ref 100–108)
Creatinine: 0.77 MG/DL (ref 0.6–1.3)
GFR est AA: >60 mL/min/{1.73_m2} (ref >60)
GFR est non-AA: >60 mL/min/{1.73_m2} (ref >60)
Globulin: 3.8 g/dL (ref 2.0–4.0)
Glucose: 116 mg/dL — ABNORMAL HIGH (ref 74–99)
Potassium: 3.7 mmol/L (ref 3.5–5.5)
Protein, total: 7.3 g/dL (ref 6.4–8.2)
Sodium: 142 mmol/L (ref 136–145)

## 2017-08-24 LAB — MAGNESIUM: Magnesium: 1.9 mg/dL (ref 1.6–2.6)

## 2017-08-24 MED ORDER — IPRATROPIUM-ALBUTEROL 2.5 MG-0.5 MG/3 ML NEB SOLUTION
2.5 mg-0.5 mg/3 ml | RESPIRATORY_TRACT | Status: AC
Start: 2017-08-24 — End: 2017-08-24
  Administered 2017-08-24: 21:00:00 via RESPIRATORY_TRACT

## 2017-08-24 MED ORDER — ALBUTEROL SULFATE 90 MCG/ACTUATION BREATH ACTIVATED POWDER INHALER
90 mcg/actuation | RESPIRATORY_TRACT | 0 refills | Status: AC | PRN
Start: 2017-08-24 — End: ?

## 2017-08-24 MED ORDER — METHYLPREDNISOLONE (PF) 125 MG/2 ML IJ SOLR
125 mg/2 mL | INTRAMUSCULAR | Status: AC
Start: 2017-08-24 — End: 2017-08-24
  Administered 2017-08-24: 21:00:00 via INTRAVENOUS

## 2017-08-24 MED ORDER — PREDNISONE 50 MG TAB
50 mg | ORAL_TABLET | Freq: Every day | ORAL | 0 refills | Status: AC
Start: 2017-08-24 — End: 2017-08-28

## 2017-08-24 MED ORDER — INHALATIONAL SPACING DEVICE
0 refills | Status: AC | PRN
Start: 2017-08-24 — End: ?

## 2017-08-24 MED FILL — SOLU-MEDROL (PF) 125 MG/2 ML SOLUTION FOR INJECTION: 125 mg/2 mL | INTRAMUSCULAR | Qty: 2

## 2017-08-24 MED FILL — IPRATROPIUM-ALBUTEROL 2.5 MG-0.5 MG/3 ML NEB SOLUTION: 2.5 mg-0.5 mg/3 ml | RESPIRATORY_TRACT | Qty: 3

## 2017-08-24 NOTE — ED Triage Notes (Addendum)
C/o shortness of breath and dizziness at times worse today. States she has COPD but never been treated for it. States she is seeing a pulmonary, PCP and cardiologist.

## 2017-08-24 NOTE — ED Notes (Signed)
I have reviewed discharge instructions with the patient.  The patient verbalized understanding.

## 2017-08-24 NOTE — ED Triage Notes (Signed)
C/o shortness of breath and dizziness at times, worse today. States she has COPD but has never been treated for it. States she is seeing a pulmonary and cardiology doctors as well as her PCP.

## 2017-08-24 NOTE — ED Provider Notes (Signed)
EMERGENCY DEPARTMENT HISTORY AND PHYSICAL EXAM    Date: 08/24/2017  Patient Name: Denise [name]    History of Presenting Illness     Chief Complaint   Patient presents with   ??? Shortness of Breath         History Provided By: Patient    Chief Complaint: SOB  Duration: 1 Days  Timing:  Acute  Modifying Factors: No relieving or worsening factors  Associated Symptoms: Intermittent worsening dizziness and chest tightness    Additional History (Context):   4:19 PM  Denise [name] is a 66 y.o. female with PMHX of COPD who presents to the emergency department C/O acute exacerbation of chronic shortness of breath earlier today. Associated sxs include intermittent worsening dizziness and chest tightness. Pt expressed frustration for her untreated COPD and is looking for treatment. Pt is a former smoker (has not smoked in one year). She says "nobody is helping me with this dizziness." Reports that she is followed by a cardiologist named Dr. Janeice Robinson at [facility]. Confirms that she has had a cardiac [place] before. Pt denies any other sxs or complaints.     PCP: Hyman Bower, MD    Current Outpatient Prescriptions   Medication Sig Dispense Refill   ??? magnesium 250 mg tab Take  by mouth.     ??? naproxen sodium (ALEVE) 220 mg tablet Take 220 mg by mouth two (2) times daily (with meals).     ??? POTASSIUM CHLORIDE by Does Not Apply route.     ??? propranolol (INDERAL) 10 mg tablet Take 10 mg by mouth four (4) times daily.         Past History     Past Medical History:  Past Medical History:   Diagnosis Date   ??? Arrhythmia    ??? Arthritis    ??? Autoimmune disease ([place])     pt was told she has an autoimmune disease but does not know what   ??? Celiac disease 2015   ??? Chronic obstructive pulmonary disease (HCC)    ??? Gastrointestinal disorder    ??? GERD (gastroesophageal reflux disease)    ??? Hypertension    ??? Hypoglycemia    ??? Meniere disease    ??? Transient ischemic attack (TIA) 2009    multiple       Past Surgical History:  Past Surgical History:    Procedure Laterality Date   ??? HX APPENDECTOMY  1981   ??? HX HERNIA REPAIR Right 1970's   ??? HX HYSTERECTOMY  1981   ??? 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 HX SALPINGO-OOPHORECTOMY  1981       Family History:  No family history on file.    Social History:  Social History   Substance Use Topics   ??? Smoking status: Former Smoker     Quit date: 04/19/2016   ??? Smokeless tobacco: Never Used   ??? Alcohol use Not on file       Allergies:  Allergies   Allergen Reactions   ??? Peanut Angioedema   ??? Phenergan [Promethazine] Anaphylaxis   ??? Strawberry Shortness of Breath   ??? Augmentin [Amoxicillin-Pot Clavulanate] Diarrhea     Severe bloody diarrhea   ??? Gluten Other (comments)   ??? Voltaren [Diclofenac Sodium] Other (comments)     syncope         Review of Systems   Review of Systems   Respiratory: Positive for chest tightness  and shortness of breath.    Neurological: Positive for dizziness.   All other systems reviewed and are negative.      Physical Exam     Vitals:    08/24/17 1615 08/24/17 1651 08/24/17 1749   BP: (!) 118/98  188/77   Pulse: 71  62   Resp: 16  16   Temp: 97.8 ??F (36.6 ??C)     SpO2: 97% 98% 100%   Weight: 72.6 kg (160 lb)     Height: _0  (1.626 m)       Physical Exam  Vital signs and nursing notes reviewed    CONSTITUTIONAL: Alert, in no apparent distress; well-developed; well-nourished.  HEAD:  Normocephalic, atraumatic  EYES: PERRL; EOM's intact.  ENTM: Nose: no rhinorrhea; Throat: no erythema or exudate, mucous membranes moist  Neck:  No JVD, supple without lymphadenopathy  RESP: Fair airway movement in both lungs with expiratory wheezes.  CV: S1 and S2 WNL; No gallops or rubs. Grade 2/4 systolic murmur  GI: Normal bowel sounds, abdomen soft and non-tender. No masses or organomegaly.  GU: No costo-vertebral angle tenderness.  BACK:  Non-tender  UPPER EXT:  Normal inspection  LOWER EXT: No edema, no calf tenderness.  Distal pulses intact.   NEURO: CN intact, reflexes 2/4 and sym, strength 5/5 and sym, sensation intact.  SKIN: No rashes; Normal for age and stage.  PSYCH:  Alert and oriented, normal affect.    Diagnostic Study Results     Labs -     Recent Results (from the past 12 hour(s))   EKG, 12 LEAD, INITIAL    Collection Time: 08/24/17  4:35 PM   Result Value Ref Range    Ventricular Rate 72 BPM    Atrial Rate 72 BPM    P-R Interval 170 ms    QRS Duration 90 ms    Q-T Interval 414 ms    QTC Calculation (Bezet) 453 ms    Calculated P Axis 48 degrees    Calculated R Axis -28 degrees    Calculated T Axis 41 degrees    Diagnosis       Normal sinus rhythm  Possible Left atrial enlargement  Left ventricular hypertrophy  Inferior infarct , age undetermined  Abnormal ECG  When compared with ECG of 02-May-2016 07:37,  Inferior infarct is now present  Nonspecific T wave abnormality now evident in Inferior leads     CBC WITH AUTOMATED DIFF    Collection Time: 08/24/17  4:42 PM   Result Value Ref Range    WBC 6.3 4.6 - 13.2 K/uL    RBC 4.45 4.20 - 5.30 M/uL    HGB 14.2 12.0 - 16.0 g/dL    HCT 42.1 35.0 - 45.0 %    MCV 94.6 74.0 - 97.0 FL    MCH 31.9 24.0 - 34.0 PG    MCHC 33.7 31.0 - 37.0 g/dL    RDW 13.5 11.6 - 14.5 %    PLATELET 209 135 - 420 K/uL    MPV 10.6 9.2 - 11.8 FL    NEUTROPHILS 48 40 - 73 %    LYMPHOCYTES 36 21 - 52 %    MONOCYTES 10 3 - 10 %    EOSINOPHILS 5 0 - 5 %    BASOPHILS 1 0 - 2 %    ABS. NEUTROPHILS 3.1 1.8 - 8.0 K/UL    ABS. LYMPHOCYTES 2.3 0.9 - 3.6 K/UL    ABS. MONOCYTES 0.6 0.05 - 1.2 K/UL  ABS. EOSINOPHILS 0.3 0.0 - 0.4 K/UL    ABS. BASOPHILS 0.1 0.0 - 0.1 K/UL    DF AUTOMATED     METABOLIC PANEL, COMPREHENSIVE    Collection Time: 08/24/17  4:42 PM   Result Value Ref Range    Sodium 142 136 - 145 mmol/L    Potassium 3.7 3.5 - 5.5 mmol/L    Chloride 105 100 - 108 mmol/L    CO2 30 21 - 32 mmol/L    Anion gap 7 3.0 - 18 mmol/L    Glucose 116 (H) 74 - 99 mg/dL    BUN 15 7.0 - 18 MG/DL    Creatinine 0.77 0.6 - 1.3 MG/DL     BUN/Creatinine ratio 19 12 - 20      GFR est AA >60 >60 ml/min/1.49m    GFR est non-AA >60 >60 ml/min/1.745m   Calcium 8.7 8.5 - 10.1 MG/DL    Bilirubin, total 0.3 0.2 - 1.0 MG/DL    ALT (SGPT) 27 13 - 56 U/L    AST (SGOT) 22 15 - 37 U/L    Alk. phosphatase 98 45 - 117 U/L    Protein, total 7.3 6.4 - 8.2 g/dL    Albumin 3.5 3.4 - 5.0 g/dL    Globulin 3.8 2.0 - 4.0 g/dL    A-G Ratio 0.9 0.8 - 1.7     MAGNESIUM    Collection Time: 08/24/17  4:42 PM   Result Value Ref Range    Magnesium 1.9 1.6 - 2.6 mg/dL   CARDIAC PANEL,(CK, CKMB & TROPONIN)    Collection Time: 08/24/17  4:42 PM   Result Value Ref Range    CK 105 26 - 192 U/L    CK - MB 1.5 <3.6 ng/ml    CK-MB Index 1.4 0.0 - 4.0 %    Troponin-I, Qt. <0.02 0.00 - 0.06 NG/ML       Radiologic Studies -   XR CHEST PA LAT   Final Result        CT Results  (Last 48 hours)    None        CXR Results  (Last 48 hours)               08/24/17 1645  XR CHEST PA LAT Final result    Impression:  IMPRESSION:   1. 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               Narrative:  EXAM:  XR CHEST PA LAT       INDICATION:   Cough with chest pain       COMPARISON: Several prior exams, most recently CT scan of the chest performed   June 16, 2016.. Marland Kitchen     FINDINGS: PA and lateral radiographs of the chest demonstrate chain suture   material which projects over the right lower lobe. Faint asymmetric right lower   lung opacity present, potentially reflecting residual scarring and/or   atelectasis from prior intrapulmonary abscess. No discrete pneumonic opacity,   pneumothorax, or pleural effusion. Stable cardiac size and mediastinal contours.   No acute osseous abnormality.                 Medications given in the ED-  Medications   albuterol-ipratropium (DUO-NEB) 2.5 MG-0.5 MG/3 ML (3 mL Nebulization  Given 08/24/17 1651)   methylPREDNISolone (PF) (SOLU-MEDROL) injection 125 mg (125 mg IntraVENous Given 08/24/17 1715)         Medical Decision Making   I am the first provider for this patient.    I reviewed the vital signs, available nursing notes, past medical history, past surgical history, family history and social history.    Vital Signs-Reviewed the patient's vital signs.    Pulse Oximetry Analysis - 97% on room air     Cardiac Monitor:  Rate: 72 bpm  Rhythm: NSR    EKG interpretation: (Preliminary)  72 bpm, NSR, possible left atrial enlargement, LVH, interior infarct, no STEMI  EKG read by Jeanella Craze, MD at 4:36 PM     Records Reviewed: Nursing Notes and Old Medical Records    Provider Notes (Medical Decision Making):   Ddx: COPD, ACS, URI, PNA    Procedures:  Procedures    ED Course:   4:19 PM Initial assessment performed. The patients presenting problems have been discussed, and they are in agreement with the care plan formulated and outlined with them.  I have encouraged them to ask questions as they arise throughout their visit.    6:04 PM Pt's lung sounds are clear bilaterally upon re-evaluation.     Diagnosis and Disposition       DISCHARGE NOTE:  6:10 PM  Denise [name]'s  results have been reviewed with her.  She has been counseled regarding her diagnosis, treatment, and plan.  She verbally conveys understanding and agreement of the signs, symptoms, diagnosis, treatment and prognosis and additionally agrees to follow up as discussed.  She also agrees with the care-plan and conveys that all of her questions have been answered.  I have also provided discharge instructions for her that include: educational information regarding their diagnosis and treatment, and list of reasons why they would want to return to the ED prior to their follow-up appointment, should her condition change. She has been provided with education for proper emergency department utilization.     CLINICAL IMPRESSION:     1. Chest pain, unspecified type    2. Chronic obstructive pulmonary disease with acute exacerbation (HCC)        PLAN:  1. D/C Home  2.   Current Discharge Medication List        3.   Follow-up Information     Follow up With Details Comments [place], MD Schedule an appointment as soon as possible for a visit in 2 days For primary care follow up [address]  [place] [address]  [phone]      Ival Bible, MD Schedule an appointment as soon as possible for a visit in 2 days For cardiology follow up [address]  [address]  [place] [address]  [phone]      Your pulmonologist Schedule an appointment as soon as possible for a visit in 2 days For pulmonoloy follow up     [facility] EMERGENCY DEPT Go to As needed, if symptoms worsen [address]  [place] [address]  [phone]        _______________________________    Attestations:  This note is prepared by Blima Ledger, acting as Scribe for Jeanella Craze, MD.    Jeanella Craze, MD:  The scribe's documentation has been prepared under my direction and personally reviewed by me in its entirety.  I confirm that the note above accurately reflects all work, treatment, procedures, and  medical decision making performed by me.  _______________________________

## 2017-09-18 LAB — EKG, 12 LEAD, INITIAL
Atrial Rate: 72 {beats}/min
Calculated P Axis: 48 degrees
Calculated R Axis: -28 degrees
Calculated T Axis: 41 degrees
Diagnosis: NORMAL
P-R Interval: 170 ms
Q-T Interval: 414 ms
QRS Duration: 90 ms
QTC Calculation (Bezet): 453 ms
Ventricular Rate: 72 {beats}/min

## 2017-09-18 LAB — EKG 12-LEAD
Atrial Rate: 72 {beats}/min
Diagnosis: NORMAL
P Axis: 48 degrees
P-R Interval: 170 ms
Q-T Interval: 414 ms
QRS Duration: 90 ms
QTc Calculation (Bazett): 453 ms
R Axis: -28 degrees
T Axis: 41 degrees
Ventricular Rate: 72 {beats}/min

## 2018-10-08 ENCOUNTER — Inpatient Hospital Stay
Admit: 2018-10-08 | Discharge: 2018-10-08 | Disposition: A | Discharge status: Home / Self Care | Payer: MEDICARE | Attending: Emergency Medicine

## 2018-10-08 DIAGNOSIS — B029 Zoster without complications: Secondary | ICD-10-CM

## 2018-10-08 MED ORDER — VALACYCLOVIR 1 G TAB
1 gram | ORAL_TABLET | Freq: Three times a day (TID) | ORAL | 0 refills | Status: AC
Start: 2018-10-08 — End: 2018-10-15

## 2018-10-08 NOTE — ED Notes (Signed)
I have reviewed discharge instructions with the patient.  The patient verbalized understanding. Discharge medications reviewed with patient and appropriate educational materials and side effects teaching were provided. Patient armband removed and shredded

## 2018-10-08 NOTE — ED Provider Notes (Signed)
EMERGENCY DEPARTMENT HISTORY AND PHYSICAL EXAM    Date: 10/08/2018  Patient Name: [name]    History of Presenting Illness     Chief Complaint   Patient presents with   ??? Rash         History Provided By: Patient    Denise [name] is a 67 y.o. 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 Associated sxs include left ear pain.  Patient reports that a week ago started with some left ear pain and took her to a local urgent care as her primary care doctor had no appointments.  Patient states she was told the urgent care "ears are clear must be an infection".  Patient states is been a prescription for doxycycline which she started.  Patient states that 2 days after starting the doxycycline patient had nausea and started to get a rash.  Patient states that this time the pain in her left ear was more intense.  She states she called her primary care doctor they told her to stop the doxycycline and go back to the local urgent care.  She called urgent care they told her to stop doxycycline and gave her Cefdinir instead.  Patient states she has had 2 doses of the new antibiotic with no improvement of pain and patient states the rash is worsening.  Pt denies fever, ear drainage, cough, and any other sxs or complaints.     PCP: Lorinda Creed, MD    Current Outpatient Medications   Medication Sig Dispense Refill   ??? valACYclovir (VALTREX) 1 gram tablet Take 1 Tab by mouth three (3) times daily for 7 days. 21 Tab 0   ??? POTASSIUM CHLORIDE by Does Not Apply route.     ??? naproxen sodium (ALEVE) 220 mg tablet Take 220 mg by mouth two (2) times daily (with meals).     ??? magnesium 250 mg tab Take  by mouth.     ??? albuterol sulfate 90 mcg/actuation aepb Take 1-2 Puffs by inhalation every four (4) hours as needed. 1 Inhaler 0   ??? propranolol (INDERAL) 10 mg tablet Take 10 mg by mouth four (4) times daily.     ??? propranolol HCl (INDERAL LA PO) Take  by mouth.      ??? magnesium 250 mg tab Take  by mouth.     ??? naproxen sodium (ALEVE) 220 mg tablet Take 220 mg by mouth two (2) times daily (with meals).     ??? POTASSIUM CHLORIDE by Does Not Apply route.     ??? inhalational spacing device 1 Each by Does Not Apply route as needed. 1 Device 0       Past History     Past Medical History:  Past Medical History:   Diagnosis Date   ??? Arrhythmia    ??? Arthritis    ??? Autoimmune disease (HCC)     pt was told she has an autoimmune disease but does not know what   ??? Celiac disease    ??? Celiac disease 2015   ??? Chronic obstructive pulmonary disease (HCC)    ??? Gastrointestinal disorder    ??? GERD (gastroesophageal reflux disease)    ??? Hypertension    ??? Hypoglycemia    ??? Lung abscess (HCC)    ??? Meniere disease    ??? Pneumonia    ??? Thyroid nodule    ??? Transient ischemic attack (TIA) 2009  multiple       Past Surgical History:  Past Surgical History:   Procedure Laterality Date   ??? BREAST SURGERY PROCEDURE UNLISTED      lumpectomy   ??? CHEST SURGERY PROCEDURE UNLISTED     ??? HX APPENDECTOMY  1981   ??? HX HERNIA REPAIR Right 1970's   ??? HX HYSTERECTOMY     ??? HX HYSTERECTOMY  1981   ??? 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 HX SALPINGO-OOPHORECTOMY  1981       Family History:  History reviewed. No pertinent family history.    Social History:  Social History     Tobacco Use   ??? Smoking status: Former Smoker     Last attempt to quit: 04/19/2016     Years since quitting: 2.4   ??? Smokeless tobacco: Never Used   Substance Use Topics   ??? Alcohol use: No   ??? Drug use: No       Allergies:  Allergies   Allergen Reactions   ??? Peanut Angioedema   ??? Phenergan [Promethazine] Anaphylaxis   ??? Strawberry Shortness of Breath   ??? Augmentin [Amoxicillin-Pot Clavulanate] Diarrhea     Severe bloody diarrhea   ??? Augmentin [Amoxicillin-Pot Clavulanate] Other (comments)     "i passed out"   ??? Gluten Other (comments)   ??? Statins-Hmg-Coa Reductase Inhibitors Other (comments)      Leg cramps   ??? Voltaren [Diclofenac Sodium] Other (comments)     syncope         Review of Systems   Review of Systems   Constitutional: Negative for fever.   HENT: Positive for ear pain. Negative for congestion and ear discharge.    Respiratory: Negative for cough.    Skin: Positive for rash.   All other systems reviewed and are negative.      Physical Exam     Vitals:    10/08/18 1146   BP: 152/85   Pulse: 95   Resp: 18   Temp: 98.2 ??F (36.8 ??C)   SpO2: 100%   Weight: 72.6 kg (160 lb)     Physical Exam   Constitutional: She is oriented to person, place, and time. She appears well-developed and well-nourished. No distress.   HENT:   Head: Normocephalic and atraumatic.   Right Ear: External ear normal.   Nose: Nose normal.   Mouth/Throat: Oropharynx is clear and moist.   Eyes: Pupils are equal, round, and reactive to light. Conjunctivae and EOM are normal.   Neck: Normal range of motion. Neck supple.   Cardiovascular: Normal rate and regular rhythm.   Pulmonary/Chest: Effort normal and breath sounds normal.   Musculoskeletal: Normal range of motion.   Neurological: She is alert and oriented to person, place, and time.   Skin: Skin is warm and dry. Rash noted.        Erythematous papular vesicular vesicular rash to left ear left lateral posterior scalp and left neck as shown rash does not cross the midline there is no exudate exam and story consistent with zoster   Psychiatric: She has a normal mood and affect. Her behavior is normal.   Nursing note and vitals reviewed.      Diagnostic Study Results     Labs -   No results found for this or any previous visit (from the past 12 hour(s)).    Radiologic Studies -   No orders to display  CT Results  (Last 48 hours)    None        CXR Results  (Last 48 hours)    None          Medications given in the ED-  Medications - No data to display      Medical Decision Making   I am the first provider for this patient.     I reviewed the vital signs, available nursing notes, past medical history, past surgical history, family history and social history.    Vital Signs-Reviewed the patient's vital signs.    Records Reviewed: Nursing Notes    Procedures:  Procedures    ED Course:   12:37 PM   Initial assessment performed. The patients presenting problems have been discussed, and they are in agreement with the care plan formulated and outlined with them.  I have encouraged them to ask questions as they arise throughout their visit.    Discussion: 67 y.o. female complaining of painful rash to the left head and neck exam and story consistent with herpes zoster.  Plan for antivirals and close PCP follow-up.  Strict return precautions discussed.         Diagnosis and Disposition       DISCHARGE NOTE:  Keela Rubert Beckstrom's  results have been reviewed with her.  She has been counseled regarding her diagnosis, treatment, and plan.  She verbally conveys understanding and agreement of the signs, symptoms, diagnosis, treatment and prognosis and additionally agrees to follow up as discussed.  She also agrees with the care-plan and conveys that all of her questions have been answered.  I have also provided discharge instructions for her that include: educational information regarding their diagnosis and treatment, and list of reasons why they would want to return to the ED prior to their follow-up appointment, should her condition change. She has been provided with education for proper emergency department utilization.     CLINICAL IMPRESSION:    1. Herpes zoster without complication        PLAN:  1. D/C Home  2.   Current Discharge Medication List      START taking these medications    Details   valACYclovir (VALTREX) 1 gram tablet Take 1 Tab by mouth three (3) times daily for 7 days.  Qty: 21 Tab, Refills: 0           3.   Follow-up Information     Follow up With Specialties Details Why Contact Info     Lorinda Creed, MD [facility] Schedule an appointment as soon as possible for a visit  [address]  [place] [address]  [phone]      [facility] EMERGENCY DEPT Emergency Medicine  As needed, If symptoms worsen [address]  [place] [address]  [phone]                  Please note that this dictation was completed with Dragon, the computer voice recognition software.  Quite often unanticipated grammatical, syntax, homophones, and other interpretive errors are inadvertently transcribed by the computer software.  Please disregard these errors.  Please excuse any errors that have escaped final proofreading.

## 2018-10-08 NOTE — ED Triage Notes (Signed)
Patient to Ed with c/o of painful rash to left face and scalp that started 10/02/18  after taking doxycyline for ear infection.

## 2018-10-08 NOTE — ED Notes (Signed)
Patient to Ed with c/o of painful rash to left face and scalp that started 10/02/18  after taking doxycyline for ear infection.

## 2018-10-08 NOTE — ED Provider Notes (Signed)
ED Provider Notes by Phillis Haggis, PA at 10/08/18 1246                Author: Phillis Haggis, PA  Service: Emergency Medicine  Author Type: Physician Assistant       Filed: 10/08/18 1251  Date of Service: 10/08/18 1246  Status: Attested           Editor: Phillis Haggis, PA (Physician Assistant)  Cosigner: Park Breed, DO at 10/08/18 1719          Attestation signed by Park Breed, DO at 10/08/18 1719          I was personally available for consultation in the emergency department.  I have reviewed the chart and agree with the documentation recorded by the [facility], including  the assessment, treatment plan, and disposition.   Park Breed, DO                                    EMERGENCY DEPARTMENT HISTORY AND PHYSICAL EXAM      Date: 10/08/2018   Patient Name: Denise [name]        History of Presenting Illness          Chief Complaint       Patient presents with        ?  Rash              History Provided By: Patient      Denise [name] is a 67 y.o.  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 Associated sxs include left ear pain.  Patient reports that a week ago started  with some left ear pain and took her to a local urgent care as her primary care doctor had no appointments.  Patient states she was told the urgent care "ears are clear must be an infection".  Patient states is been a prescription for doxycycline which  she started.  Patient states that 2 days after starting the doxycycline patient had nausea and started to get a rash.  Patient states that this time the pain in her left ear was more intense.  She states she called her primary care doctor they told her  to stop the doxycycline and go back to the local urgent care.  She called urgent care they told her to stop doxycycline and gave her Cefdinir instead.  Patient states she has had 2 doses of the new antibiotic with no improvement of pain and patient  states  the rash is worsening.  Pt denies fever, ear drainage, cough, and any other sxs or complaints.       PCP: Lorinda Creed, MD        Current Outpatient Medications          Medication  Sig  Dispense  Refill           ?  valACYclovir (VALTREX) 1 gram tablet  Take 1 Tab by mouth three (3) times daily for 7 days.  21 Tab  0     ?  POTASSIUM CHLORIDE  by Does Not Apply route.         ?  naproxen sodium (ALEVE) 220 mg tablet  Take 220 mg by mouth two (2) times daily (with meals).         ?  magnesium 250 mg  tab  Take  by mouth.               ?  albuterol sulfate 90 mcg/actuation aepb  Take 1-2 Puffs by inhalation every four (4) hours as needed.  1 Inhaler  0           ?  propranolol (INDERAL) 10 mg tablet  Take 10 mg by mouth four (4) times daily.         ?  propranolol HCl (INDERAL LA PO)  Take  by mouth.         ?  magnesium 250 mg tab  Take  by mouth.         ?  naproxen sodium (ALEVE) 220 mg tablet  Take 220 mg by mouth two (2) times daily (with meals).         ?  POTASSIUM CHLORIDE  by Does Not Apply route.               ?  inhalational spacing device  1 Each by Does Not Apply route as needed.  1 Device  0             Past History        Past Medical History:     Past Medical History:        Diagnosis  Date         ?  Arrhythmia       ?  Arthritis       ?  Autoimmune disease (HCC)            pt was told she has an autoimmune disease but does not know what         ?  Celiac disease       ?  Celiac disease  2015     ?  Chronic obstructive pulmonary disease (HCC)       ?  Gastrointestinal disorder       ?  GERD (gastroesophageal reflux disease)       ?  Hypertension       ?  Hypoglycemia       ?  Lung abscess (HCC)       ?  Meniere disease       ?  Pneumonia       ?  Thyroid nodule       ?  Transient ischemic attack (TIA)  2009          multiple           Past Surgical History:     Past Surgical History:         Procedure  Laterality  Date          ?  BREAST SURGERY PROCEDURE UNLISTED              lumpectomy           ?  CHEST SURGERY PROCEDURE UNLISTED         ?  HX APPENDECTOMY    1981     ?  HX HERNIA REPAIR  Right  1970's     ?  HX HYSTERECTOMY         ?  HX HYSTERECTOMY    1981     ?  HX OTHER SURGICAL  Right  1982          open-chest surgery for coccidiomycosis "Valley Fever"          ?  HX SALPINGO-OOPHORECTOMY  1981           Family History:   History reviewed. No pertinent family history.      Social History:     Social History          Tobacco Use         ?  Smoking status:  Former Smoker              Last attempt to quit:  04/19/2016         Years since quitting:  2.4         ?  Smokeless tobacco:  Never Used       Substance Use Topics         ?  Alcohol use:  No         ?  Drug use:  No           Allergies:     Allergies        Allergen  Reactions         ?  Peanut  Angioedema     ?  Phenergan [Promethazine]  Anaphylaxis     ?  Strawberry  Shortness of Breath     ?  Augmentin [Amoxicillin-Pot Clavulanate]  Diarrhea             Severe bloody diarrhea         ?  Augmentin [Amoxicillin-Pot Clavulanate]  Other (comments)             "i passed out"         ?  Gluten  Other (comments)     ?  Statins-Hmg-Coa Reductase Inhibitors  Other (comments)             Leg cramps         ?  Voltaren [Diclofenac Sodium]  Other (comments)             syncope                Review of Systems     Review of Systems    Constitutional: Negative for fever.    HENT: Positive for ear pain. Negative for congestion and ear discharge.     Respiratory: Negative for cough.     Skin: Positive for rash.    All other systems reviewed and are negative.           Physical Exam          Vitals:          10/08/18 1146        BP:  152/85     Pulse:  95     Resp:  18     Temp:  98.2 ??F (36.8 ??C)     SpO2:  100%        Weight:  72.6 kg (160 lb)        Physical Exam    Constitutional: She is oriented to person, place, and time. She appears well-developed and well-nourished. No distress.    HENT:    Head: Normocephalic and atraumatic.   Right Ear:  External ear normal.    Nose: Nose normal.    Mouth/Throat: Oropharynx is clear and moist.    Eyes: Pupils are equal, round, and reactive to light. Conjunctivae and EOM are normal.    Neck: Normal range of motion. Neck supple.    Cardiovascular: Normal rate and regular rhythm.    Pulmonary/Chest: Effort normal and breath sounds normal.   Musculoskeletal:  Normal range of motion.   Neurological: She is alert and oriented to person, place, and time.    Skin: Skin is warm and dry. Rash noted.          Erythematous papular vesicular vesicular rash to left ear left lateral posterior scalp and left neck as shown rash does not cross  the midline there is no exudate exam and story consistent with zoster    Psychiatric: She has a normal mood and affect. Her behavior is normal.    Nursing note and vitals reviewed.           Diagnostic Study Results        Labs -    No results found for this or any previous visit (from the past 12 hour(s)).      Radiologic Studies -      No orders to display          CT Results   (Last 48 hours)          None                 CXR Results   (Last 48 hours)          None                  Medications given in the ED-   Medications - No data to display           Medical Decision Making     I am the first provider for this patient.      I reviewed the vital signs, available nursing notes, past medical history, past surgical history, family history and social history.      Vital Signs-Reviewed the patient's vital signs.      Records Reviewed: Nursing Notes      Procedures:   Procedures      ED Course:    12:37 PM    Initial assessment performed. The patients presenting problems have been discussed, and they are in agreement with the care plan formulated and outlined with them.  I have encouraged them to ask questions as they arise throughout their visit.      Discussion: 67 y.o. female  complaining of painful rash to the left head and neck exam and story consistent with herpes zoster.  Plan for  antivirals and close PCP follow-up.  Strict return precautions discussed.               Diagnosis and Disposition           DISCHARGE NOTE:   Denise [name]'s  results have been reviewed with her .  She has been counseled regarding her  diagnosis, treatment, and plan.  She verbally conveys understanding and agreement of the signs, symptoms, diagnosis, treatment and prognosis  and additionally agrees to follow up as discussed.  She also agrees with the care-plan and conveys that all of  her questions have been answered.  I have also provided discharge instructions for her that  include: educational information regarding their diagnosis and treatment, and list of reasons why they would want to return to the ED prior to their follow-up appointment, should her  condition change. She has been provided with education for proper emergency department utilization.       CLINICAL IMPRESSION:         1.  Herpes zoster without complication            PLAN:   1. D/C Home  2.      Current Discharge Medication List              START taking these medications          Details        valACYclovir (VALTREX) 1 gram tablet  Take 1 Tab by mouth three (3) times daily for 7 days.   Qty: 21 Tab, Refills:  0                      3.      Follow-up Information               Follow up With  Specialties  Details  Why  Contact Info              Lorinda Creed, MD  Family Practice  Schedule an appointment as soon as possible for a visit    [address]   [place] [address]   [phone]                 [facility] EMERGENCY DEPT  Emergency Medicine    As needed, If symptoms worsen  [address]   [place] [address]   [phone]                            Please note that this dictation was completed with Dragon, the computer voice recognition software.  Quite often unanticipated grammatical, syntax, homophones, and other interpretive  errors are inadvertently transcribed by the computer software.  Please disregard these  errors.  Please excuse any errors that have escaped final proofreading.

## 2021-04-23 IMAGING — MG MAMMO BREAST SCREENING TOMOSYNTHESIS BILATERAL
9 of 12 series · 9 of 28 positions shown · non-contrast
Comparison: none

This is a summary report. The complete report is available in the patient's medical record. If you cannot access the medical record, please contact the sending organization for a detailed fax or copy.
EXAM:
Mammogram breast screening tomosynthesis bilateral
REASON FOR EXAM: Breast cancer screening, average or low risk
LMP: No LMP recorded. Patient has had a hysterectomy.
BREAST COMPOSITION:
The breasts have scattered areas of fibroglandular density.

[R CC synth-2D]
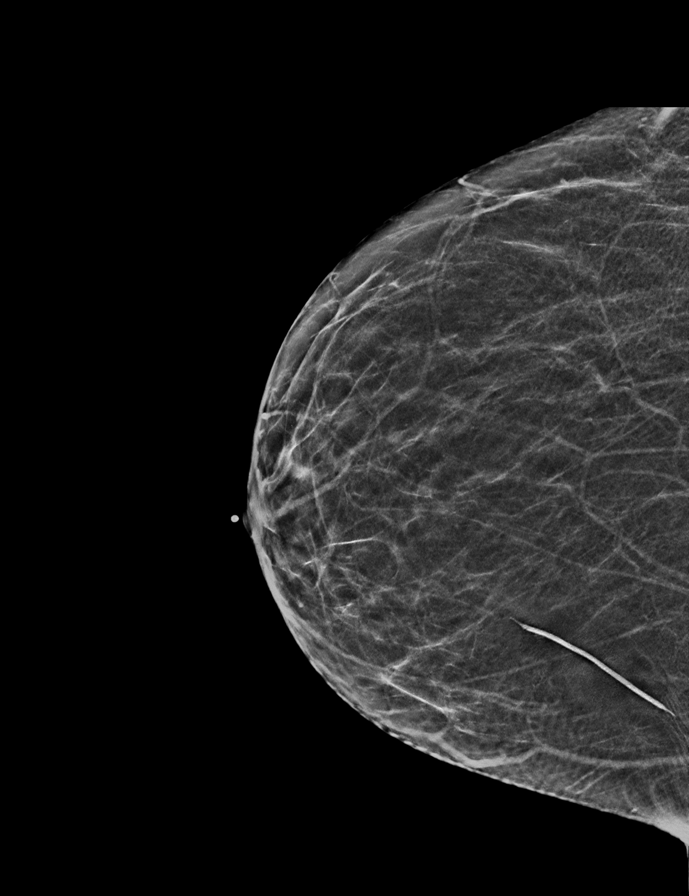

[R MLO]
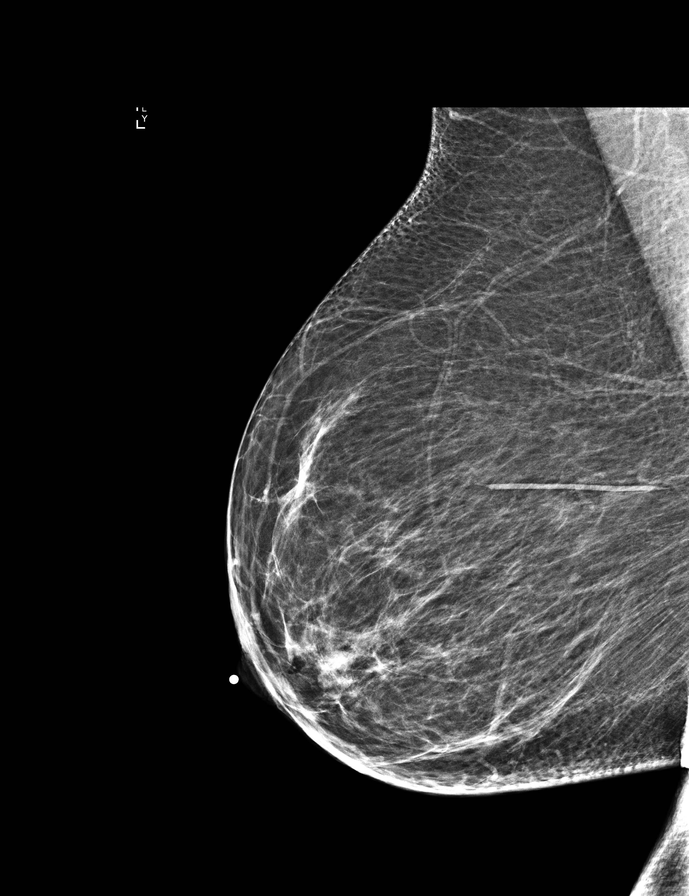

[L CC]
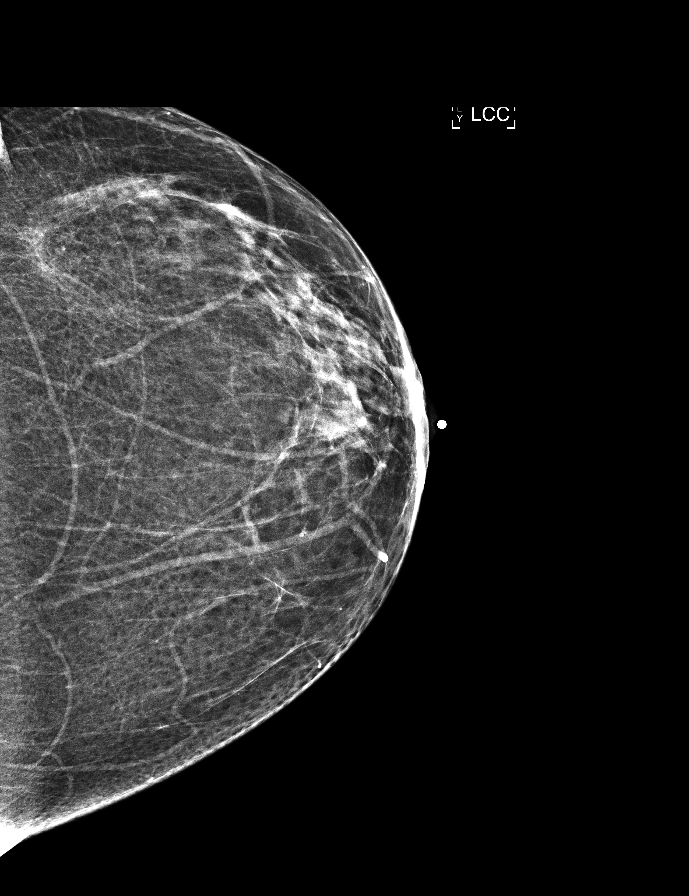

[R CC]
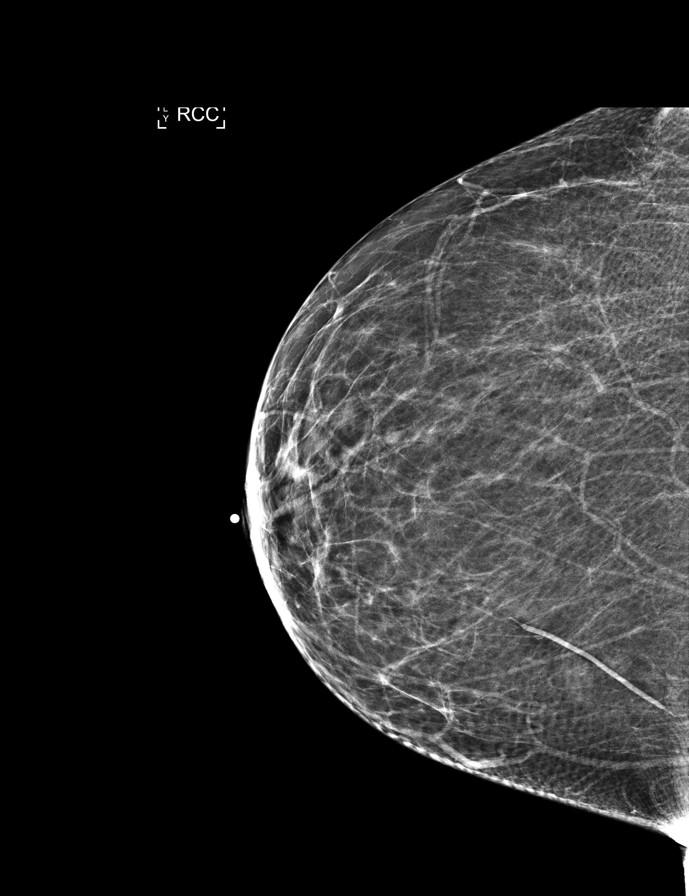

[L MLO]
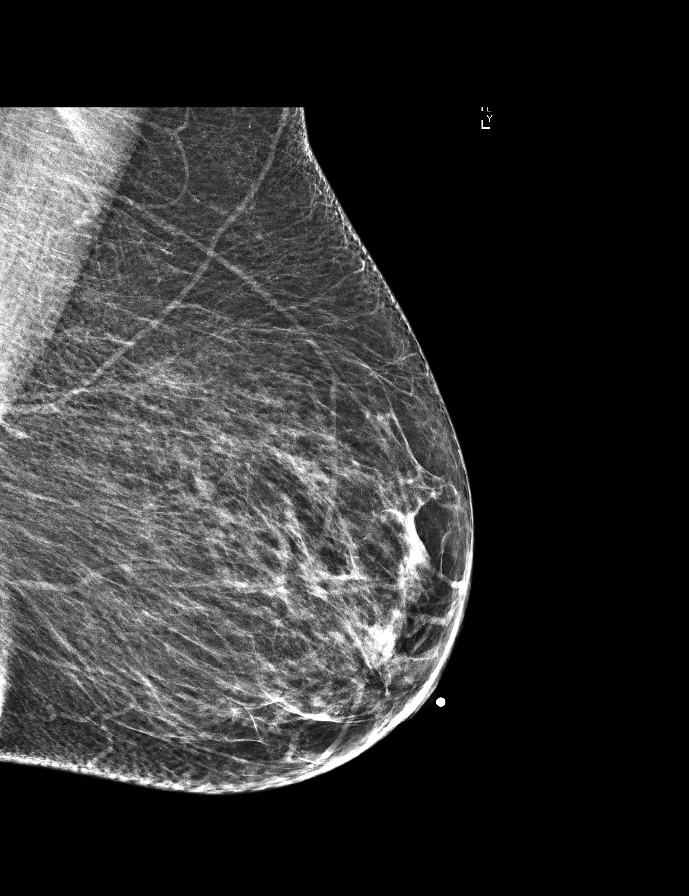

[L CC synth-2D]
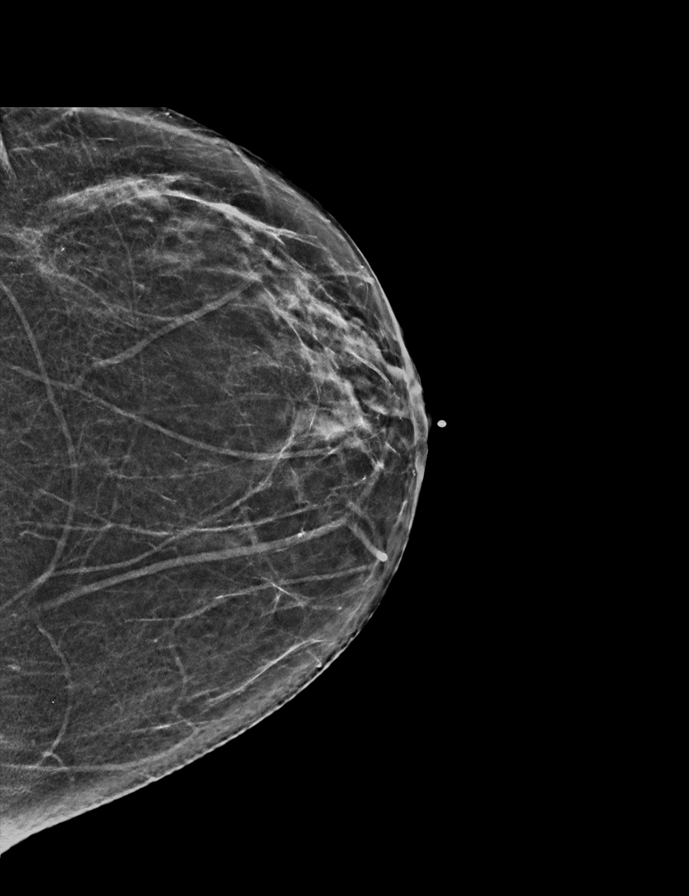

[L MLO synth-2D]
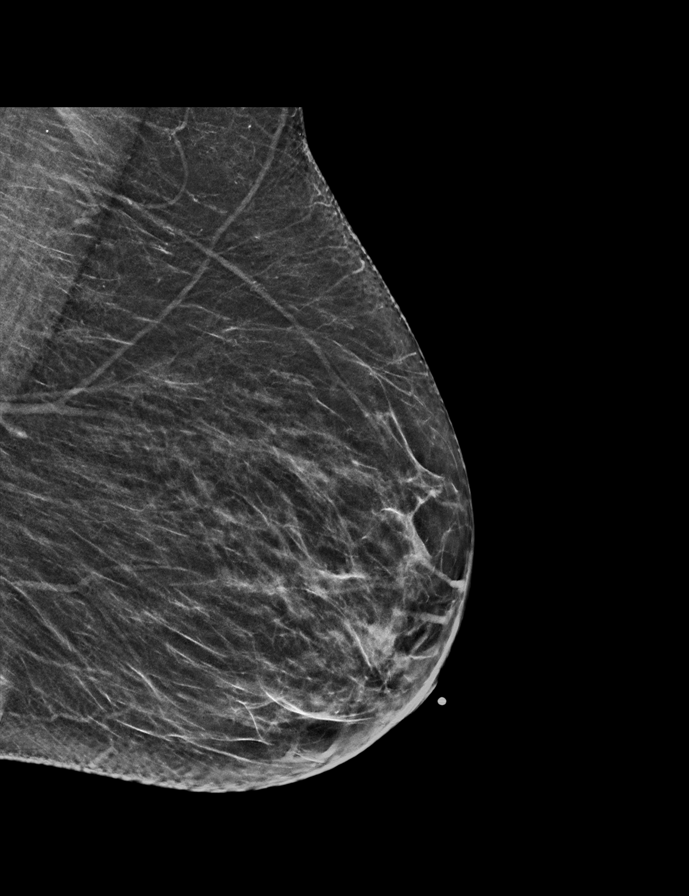

[R MLO synth-2D]
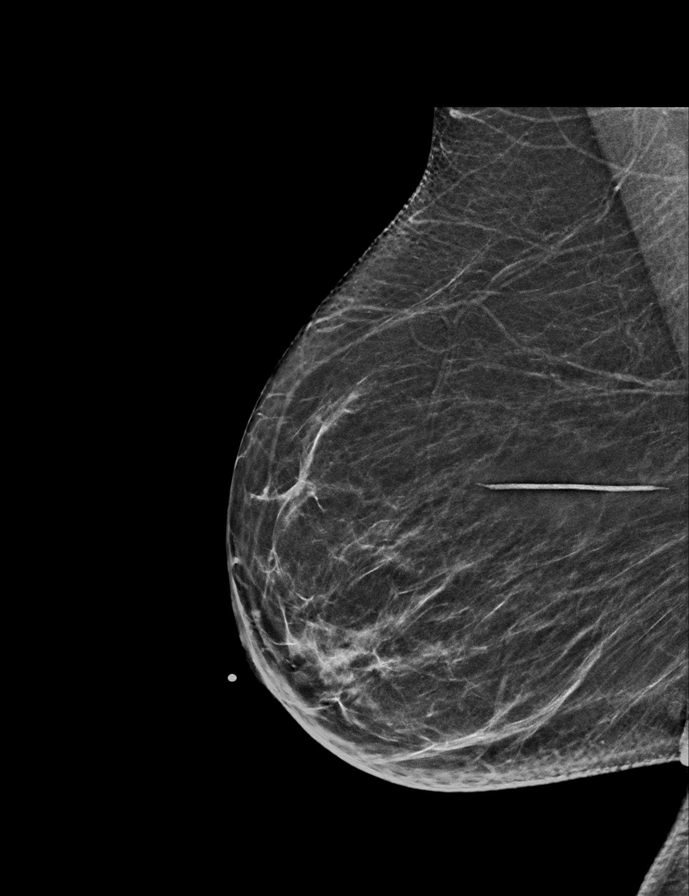

[L MLO tomo · tomo slice 27/53.0]
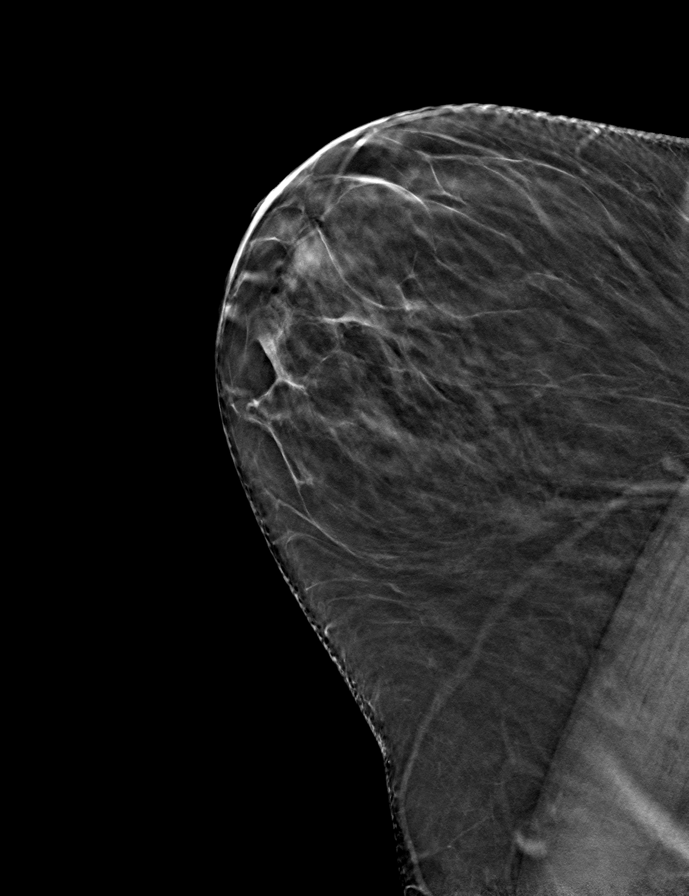

[9 of 28 positions shown; findings below may reference images not displayed]

FINDINGS: Computer-aided detection was utilized by the radiologist in the interpretation of this examination. This procedure was performed using Tomosynthesis.
There is no evidence of suspicious masses, calcifications, or other abnormal findings.
No comparisons were made when reading this study.
IMPRESSION: No mammographic evidence of malignancy.
BI-RADS CATEGORY:
Overall: 2 - Benign
RECOMMENDATION:
- Routine Screening Mammogram in 1 Yr.
LOCATION CODE: 1

## 2021-05-04 IMAGING — US US CAROTID DOPPLER BILATERAL
1 series · 14 of 24 positions shown · non-contrast
Comparison: None.

FINAL REPORT:
Ultrasound carotid. May 04, 2021 at 1003 hours
CLINICAL HISTORY: CVA.

[Series 1: us carotid doppler bilateral · 14 of 35 slices shown]
[im 1/35]
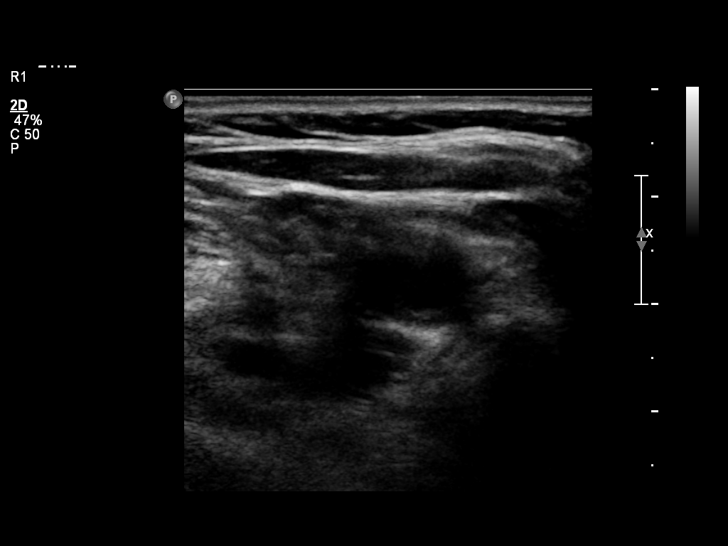
[im 3/35]
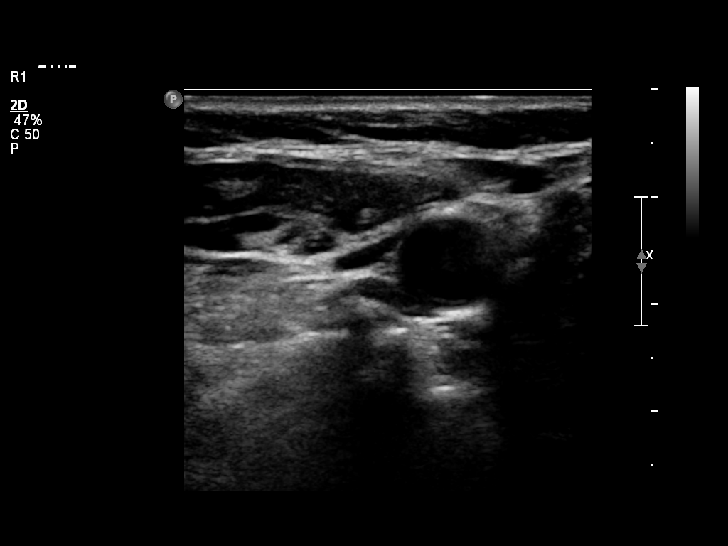
[im 6/35]
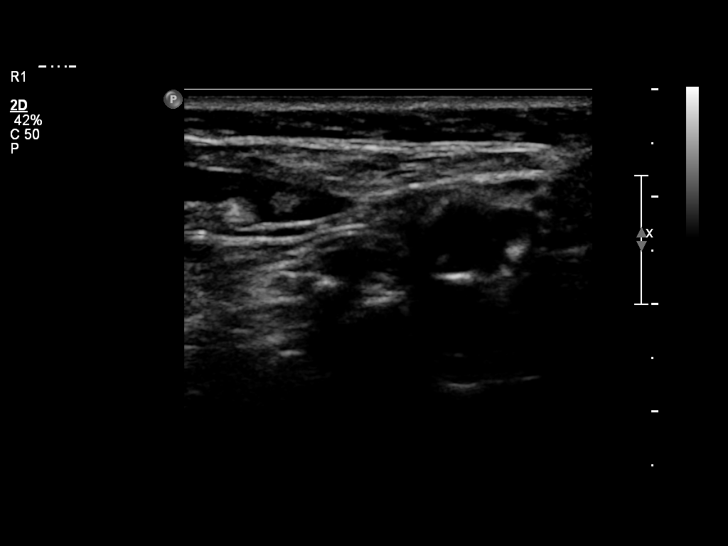
[im 9/35]
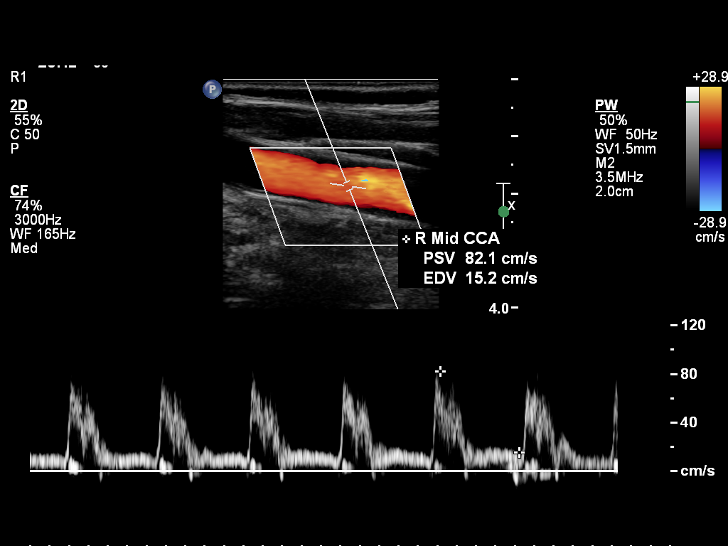
[im 11/35]
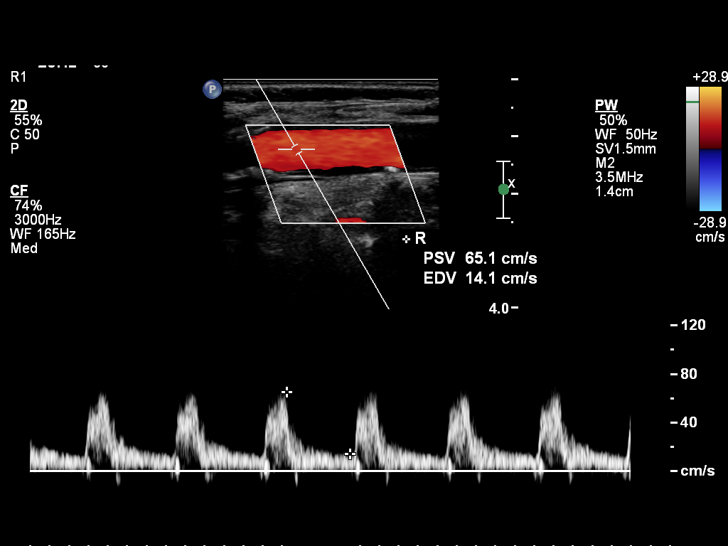
[im 14/35]
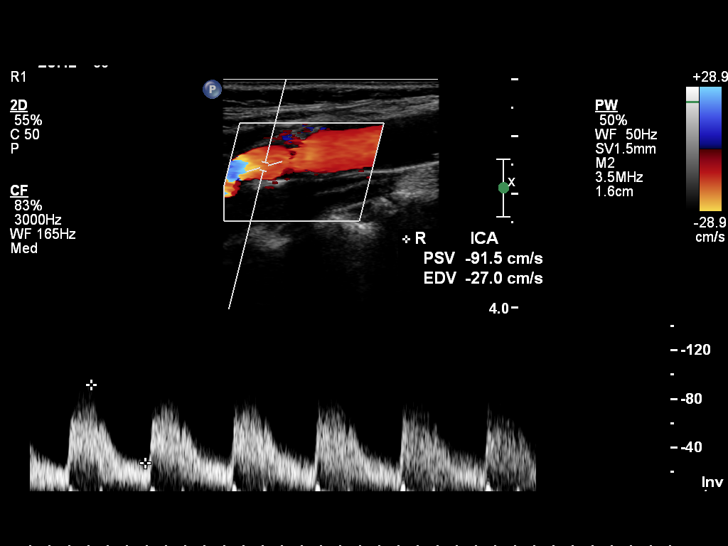
[im 17/35]
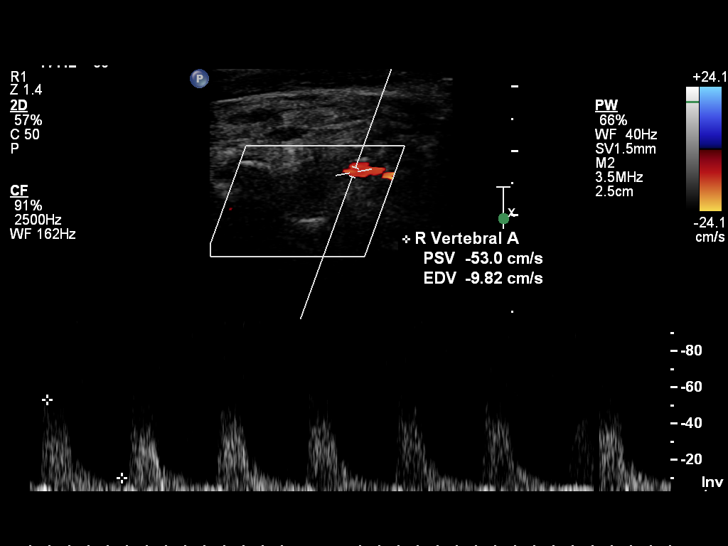
[im 18/35]
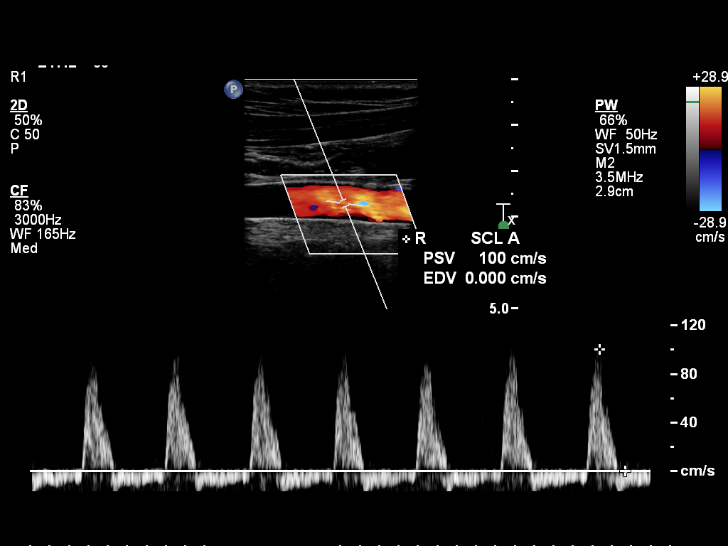
[im 21/35]
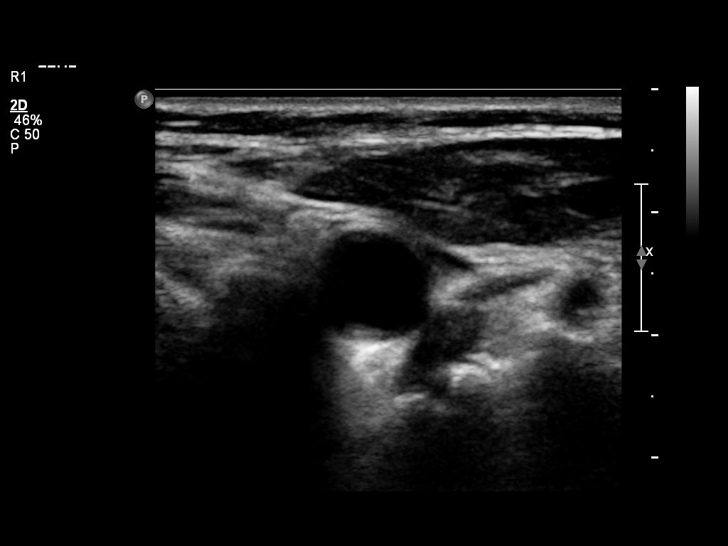
[im 24/35]
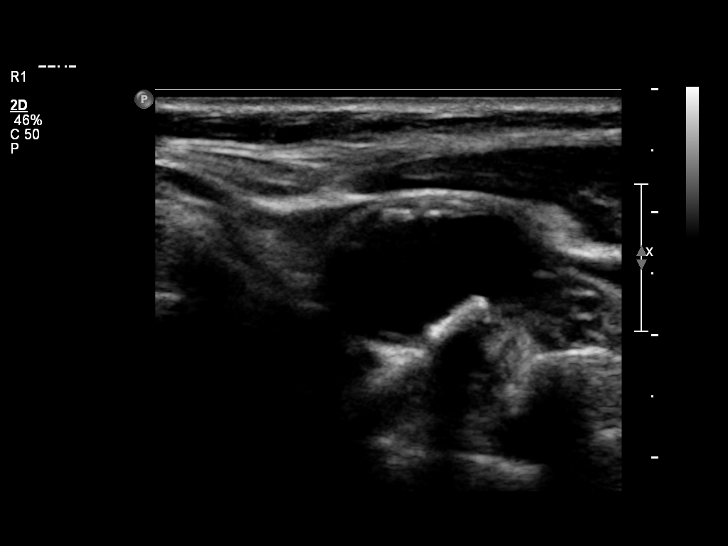
[im 27/35]
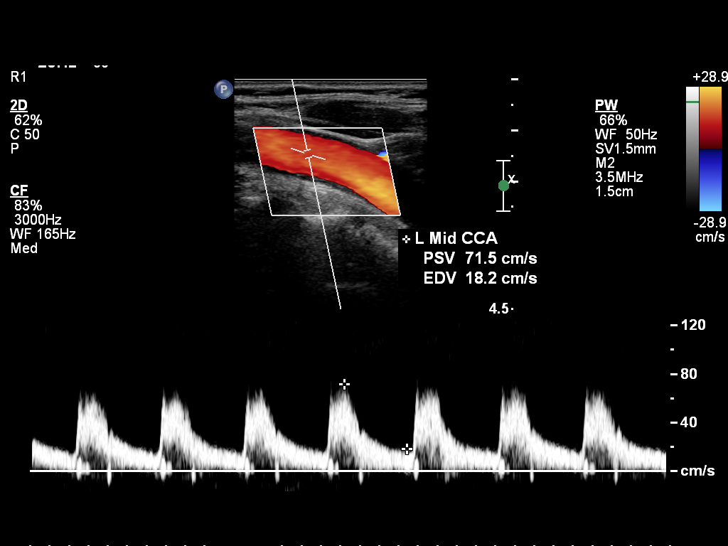
[im 29/35]
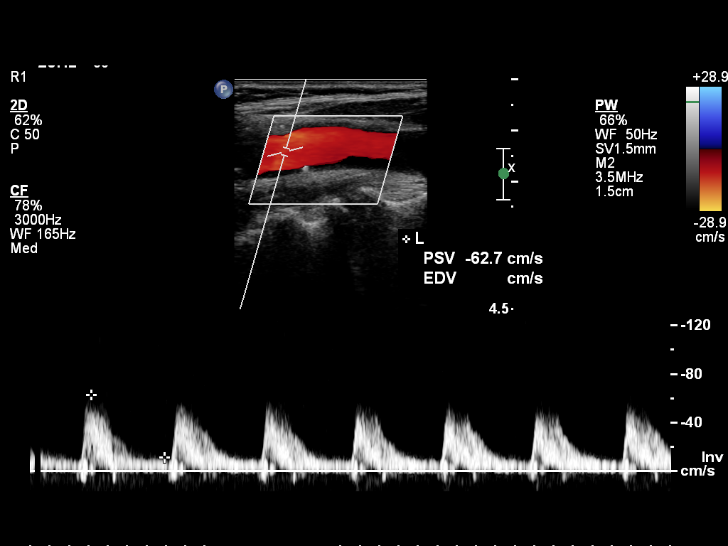
[im 32/35]
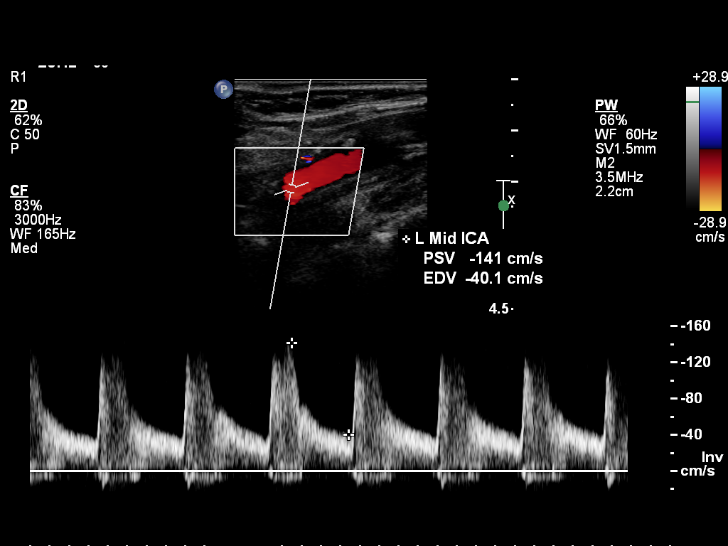
[im 35/35]
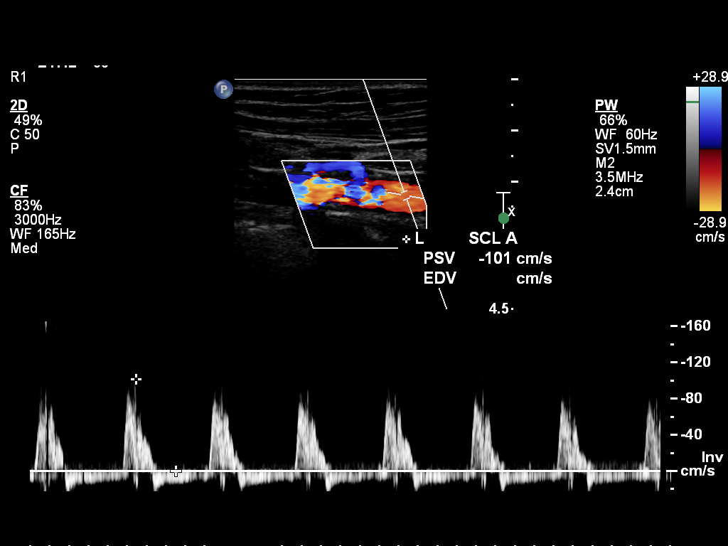

[14 of 24 positions shown; findings below may reference images not displayed]

FINDINGS: Gray scale, color flow and spectral Doppler evaluation of the carotid and vertebral arteries were performed bilaterally.
Right: The common carotid, external/internal carotid and vertebral arteries demonstrate normal color flow and spectral profile. There are small calcified plaques in the right carotid bulb without stenosis. Normal antegrade flow is noted in the vertebral artery.
Left: The common carotid, external/internal carotid and vertebral arteries demonstrate normal color flow and spectral profile. There are small calcified plaques in the right carotid bulb without stenosis. Normal antegrade flow is noted in the vertebral artery.
Doppler profile:
Right
Left
ICA
129 cm/sec
141 cm/sec
CCA
80 cm/sec
72 cm/sec
ICA/CCA
1.57
1.97
IMPRESSION: No hemodynamically significant carotid artery stenosis bilaterally.
Normal antegrade flow in the vertebral arteries bilaterally.

## 2021-05-04 IMAGING — MR MRI BRAIN WITHOUT CONTRAST
9 of 11 series · 34 of 48 positions shown · IV contrast (gadolinium)
Comparison: CT brain same day at [DATE] hours.

FINAL REPORT:
MRI of the brain: May 04, 2021 at 8213 hours
CLINICAL HISTORY: Neuro deficit, acute, stroke suspected.
TECHNIQUE: Axial T1, FLAIR, DWI, ADC, FFE; sagittal T1 MRI sequences through the brain were obtained without intravenous gadolinium.

[Series 2: survey_mpr_sag · sagittal · 1.6mm · 1.60mm/px · 1 of 5 slices shown]
[im 1/5]
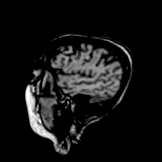

[Series 3: survey_mpr_cor · coronal · 1.6mm · 1.60mm/px · 1 of 3 slices shown]
[im 1/3]
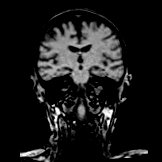

[Series 4: survey_mpr_tra · axial · 1.6mm · 1.60mm/px · 1 of 3 slices shown]
[im 1/3]
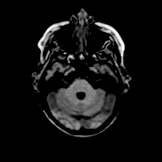

[Series 5: T1 · sagittal · 5.0mm · 0.72mm/px · 4 of 25 slices shown (1 of 2)]
[im 1/25]
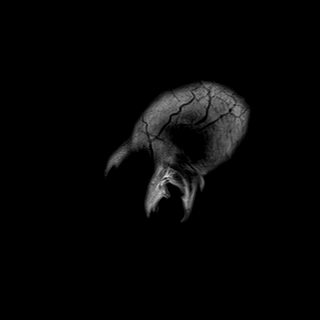
[im 9/25]
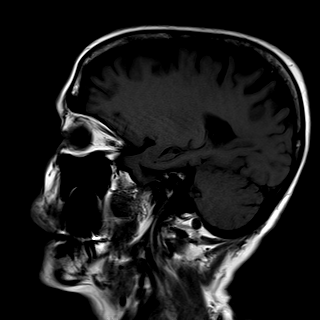
[im 17/25]
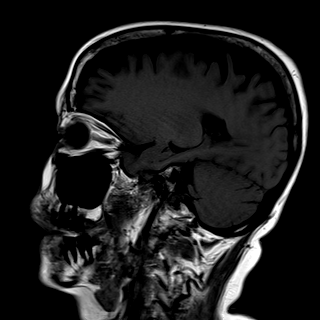
[im 25/25]
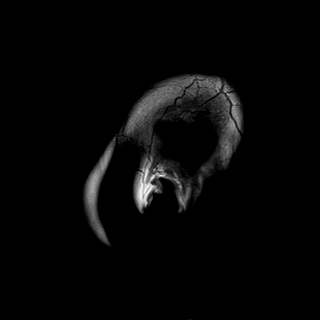

[Series 6: ax dwi_tracew · axial · 4.0mm · 0.60mm/px · z∈[-48,+31]mm · 3 of 30 slices shown]
[im 1/30]
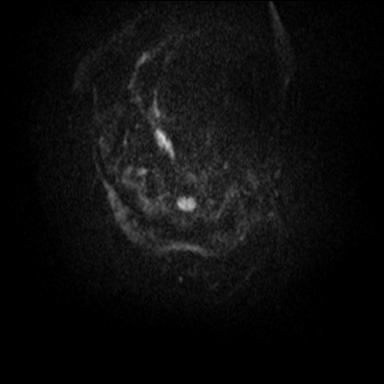
[im 8/30]
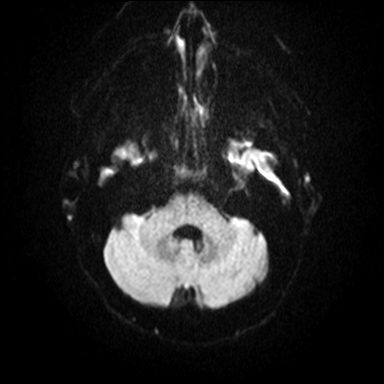
[im 15/30]
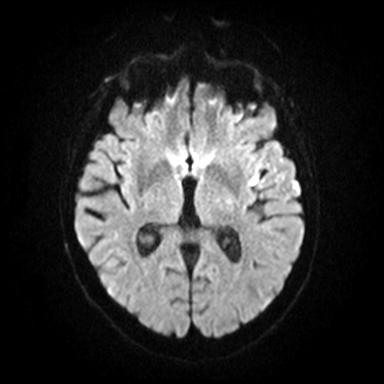

[Series 9: T2 · axial · 4.0mm · 0.45mm/px · z∈[-52,+110]mm · 6 of 30 slices shown]
[im 1/30]
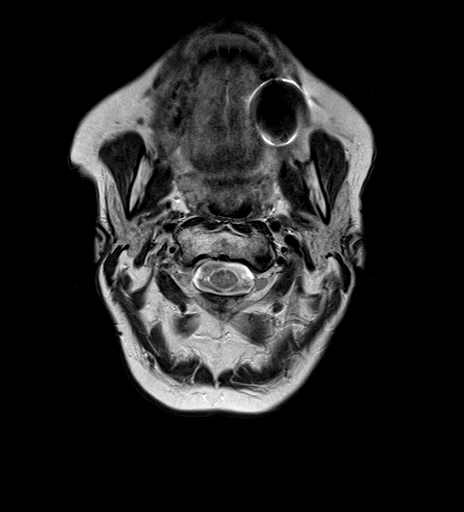
[im 6/30]
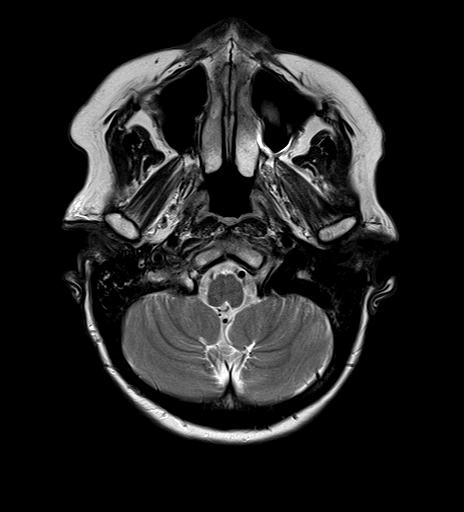
[im 12/30]
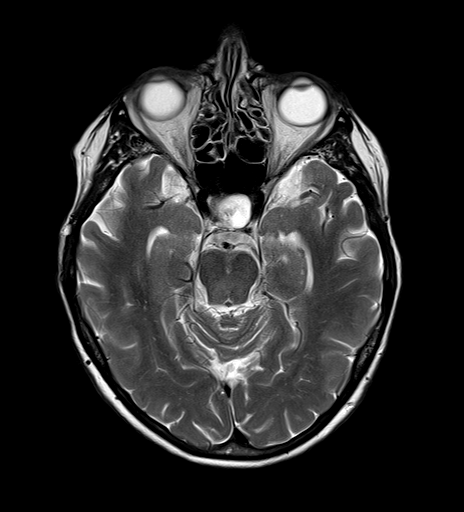
[im 18/30]
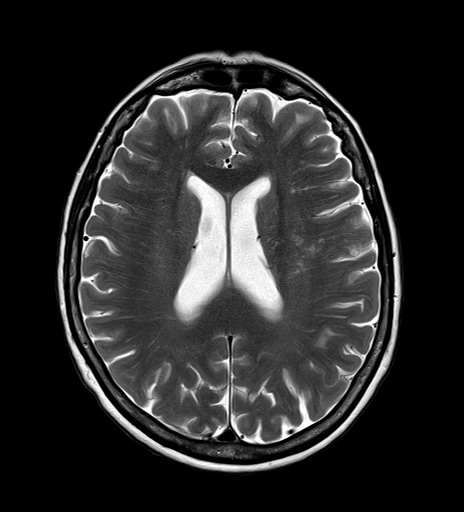
[im 24/30]
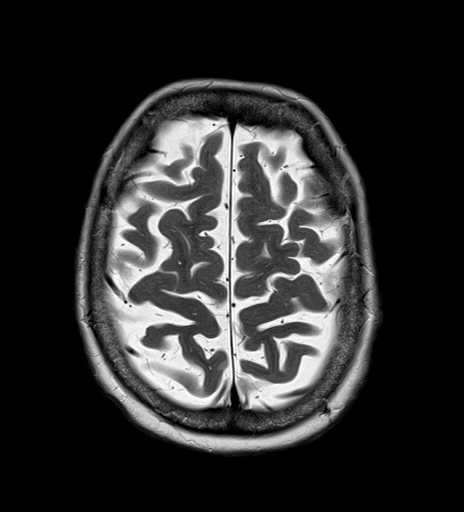
[im 30/30]
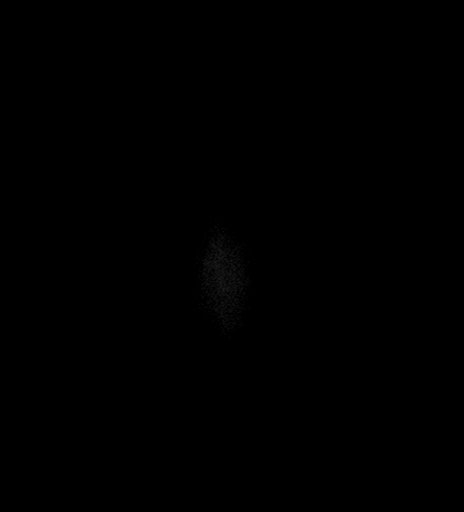

[Series 10: T1 · axial · 4.0mm · 0.72mm/px · z∈[-48,+114]mm · 6 of 30 slices shown (2 of 2)]
[im 1/30]
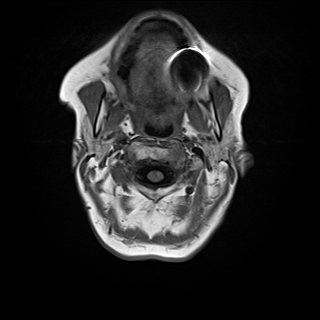
[im 6/30]
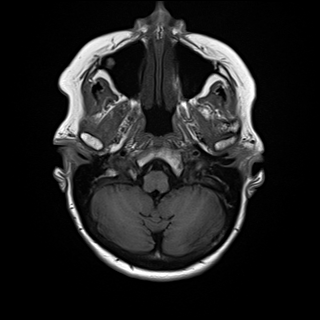
[im 12/30]
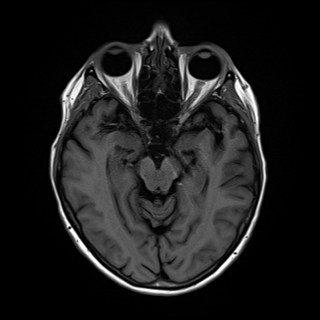
[im 18/30]
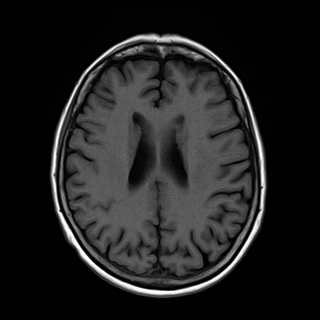
[im 24/30]
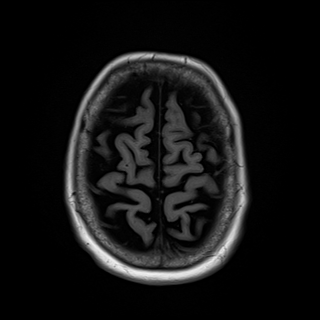
[im 30/30]
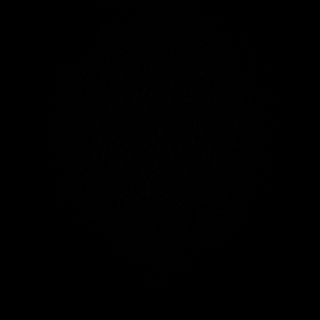

[Series 11: FLAIR · axial · 4.0mm · 0.72mm/px · z∈[-48,+114]mm · 6 of 30 slices shown]
[im 1/30]
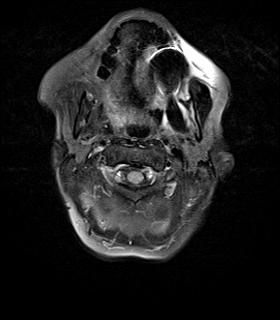
[im 6/30]
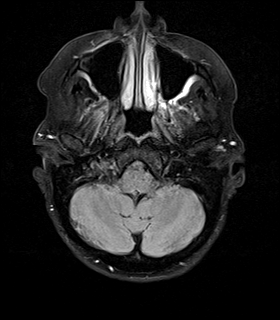
[im 12/30]
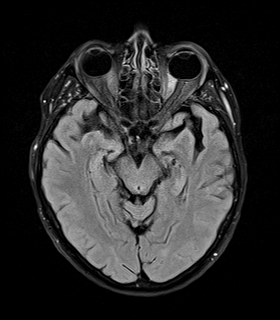
[im 18/30]
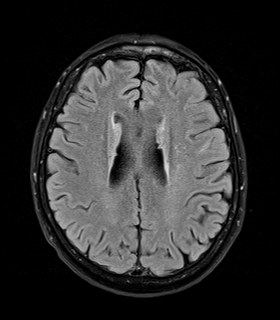
[im 24/30]
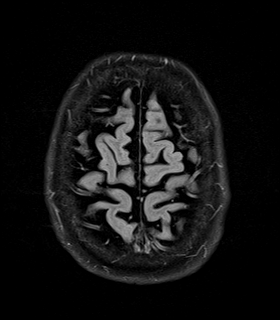
[im 30/30]
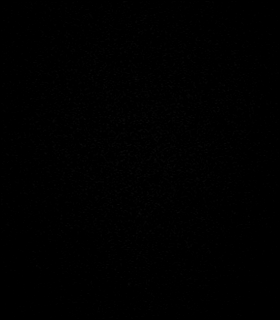

[Series 12: GRE · axial · 4.0mm · 0.45mm/px · z∈[-34,+118]mm · 6 of 28 slices shown]
[im 1/28]
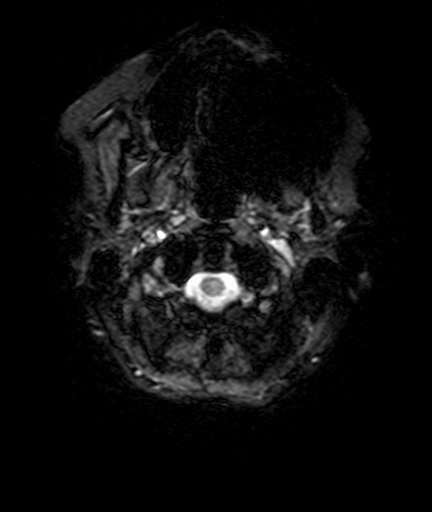
[im 6/28]
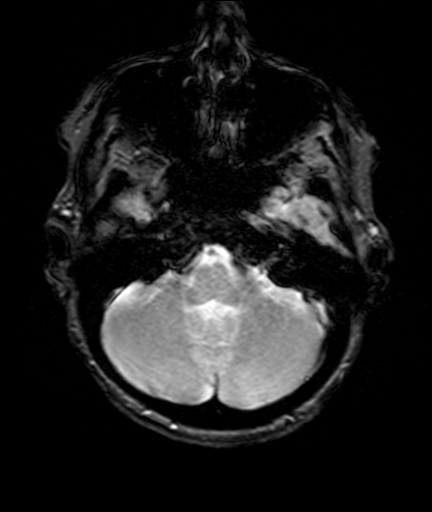
[im 11/28]
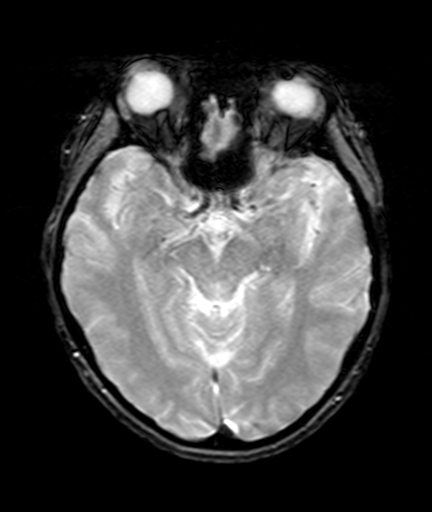
[im 17/28]
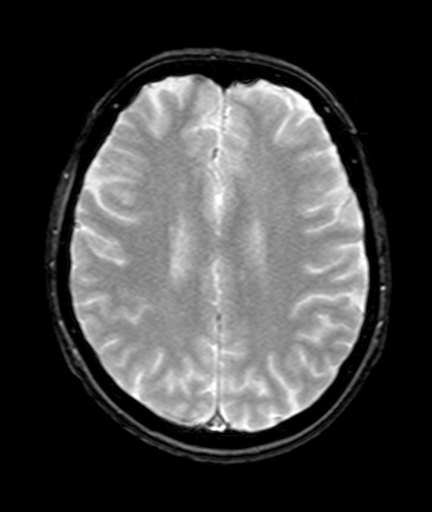
[im 22/28]
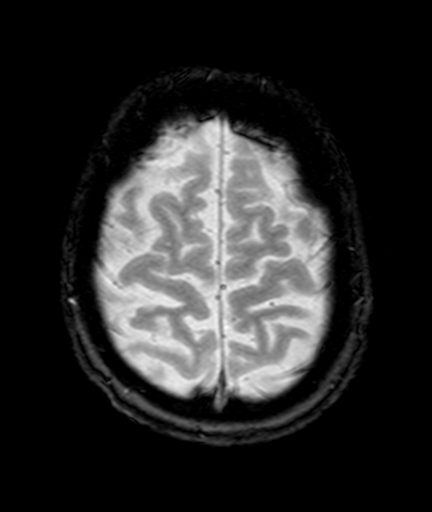
[im 28/28]
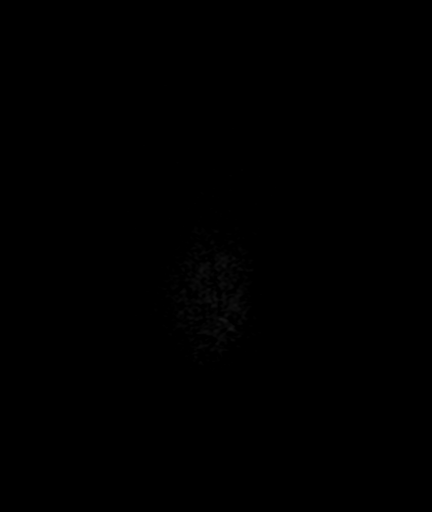

[34 of 48 positions shown; findings below may reference images not displayed]

FINDINGS: There is an abnormal signal in the deep white matter of the left centrum semiovale, there is signal dropout on ADC imaging indicating that, this is an acute process.  Remaining brain parenchyma is unremarkable. The hippocampal complexes are symmetric. There is no evidence of acute intracranial hemorrhage. There is no blooming on gradient echo images to suggest prior hemorrhage or calcification. There is no evidence of mass. The ventricles, basal cisterns and sulci are within normal limits. The seventh and eighth nerve complexes are unremarkable. Normal intracranial flow voids are noted.  There is an empty sella. The calvarium and cervicomedullary junction appear unremarkable. The visualized paranasal sinuses demonstrate mild bilateral ethmoidal sinusitis. Mild right-sided mastoiditis is noted.
Acute infarct in the deep white matter of the left centrum semiovale with signal dropout on ADC imaging.
Mild right mastoiditis.
Minimal sinusitis.
Empty sella.
Discussion Details:
Results verbally communicated to : Ademar Tammy, Nurse Practitioner at [DATE] 05/04/2021

## 2021-05-04 IMAGING — CR XR CHEST 1 VIEW
1 series · 1 of 1 positions shown · non-contrast
Comparison: none

FINAL REPORT:
SINGLE PORTABLE CHEST:
CLINICAL INDICATION: short of breath
REFERENCE: None

[AP]
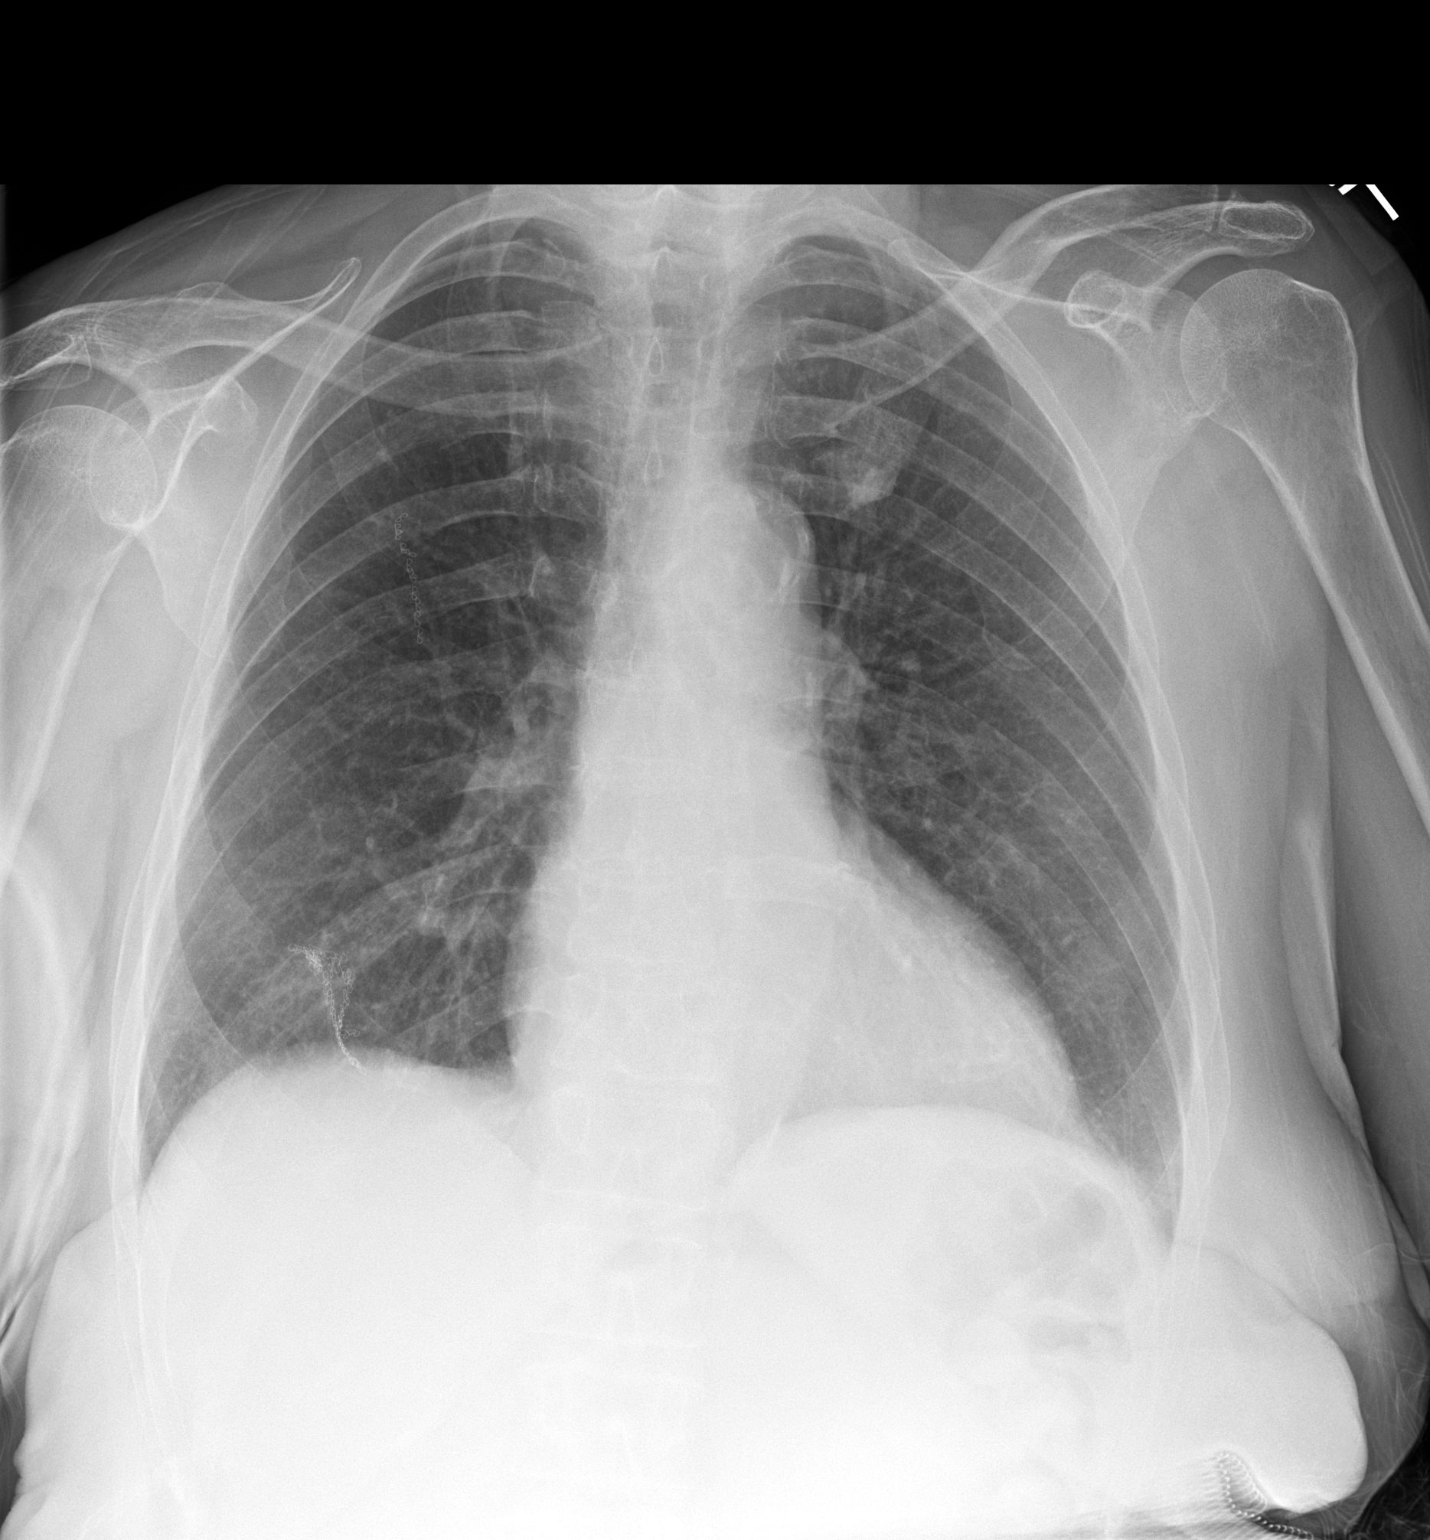

[1 of 1 positions shown; findings below may reference images not displayed]

FINDINGS: Single radiograph of the chest demonstrates normal cardiomediastinal contours.  Postsurgical changes of right upper and lower lower thorax.
The lungs demonstrate no mass, infiltrate, or effusion.
Surrounding osseous and soft tissue structures are unremarkable.
IMPRESSION: No acute cardiopulmonary process identified.
LOCATION CODE: 1

## 2021-05-04 IMAGING — CT CT CERVICAL SPINE WITHOUT CONTRAST
4 of 6 series · 15 of 33 positions shown, 17 images · IV contrast (agent unspecified)
Comparison: none

FINAL REPORT:
CT OF THE CERVICAL SPINE WITHOUT CONTRAST:
CLINICAL INDICATION: fall
REFERENCE: None
TECHNIQUE: Multiple axial images were obtained through the cervical spine without the administration of contrast.  Subsequent coronal and sagittal reformats were then obtained.

[Series 5: c-spine stnd · axial · 0.35mm/px · z∈[-145,-45]mm · 4 of 68 slices shown]
[im 14/68  soft-tissue]
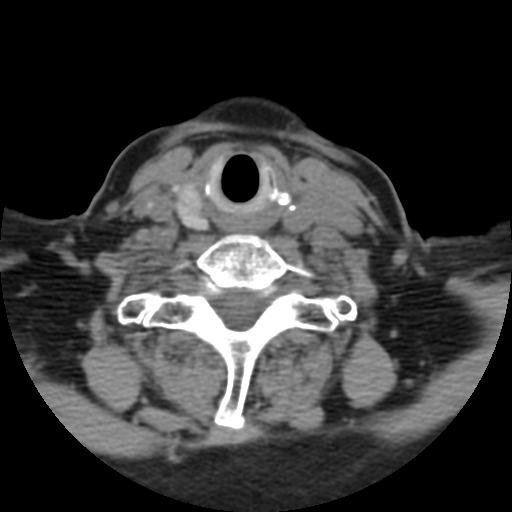
[im 27/68  soft-tissue]
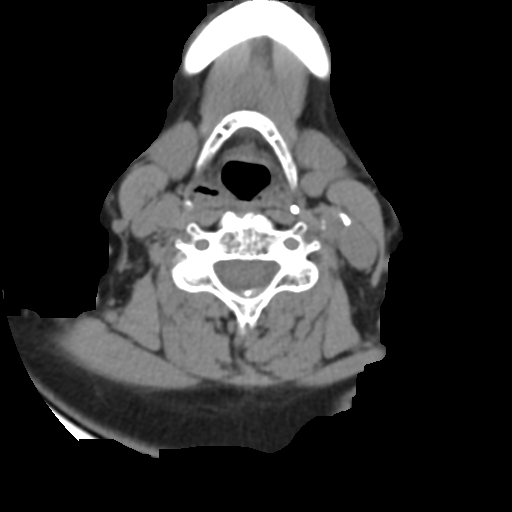
[im 41/68  soft-tissue]
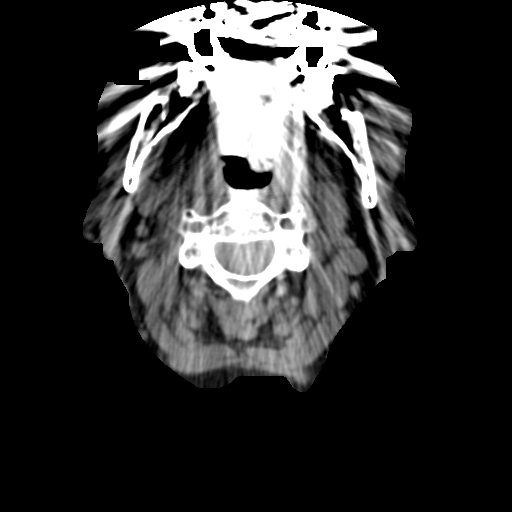
[im 54/68  soft-tissue]
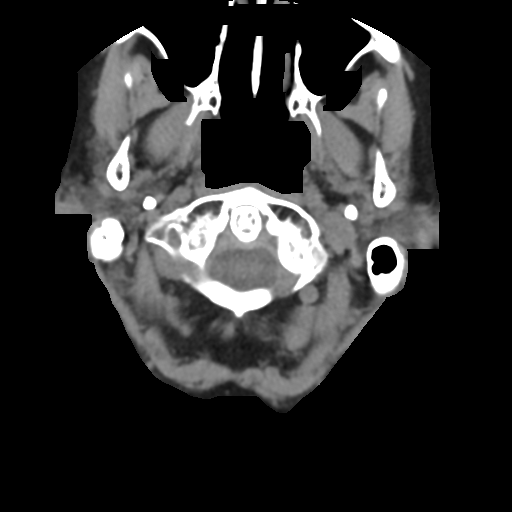

[Series 604: cor bone 2x2 · coronal · 0.35mm/px · 3 of 50 slices shown]
[im 10/50  bone]
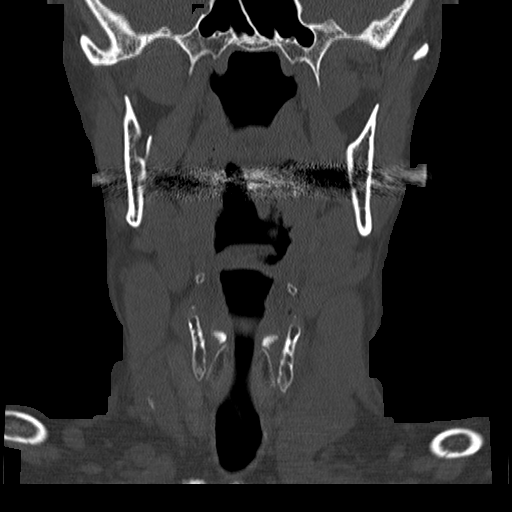
[im 20/50  bone]
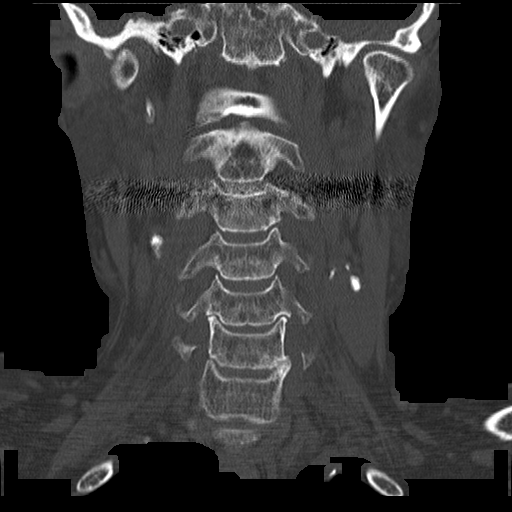
[im 30/50  bone]
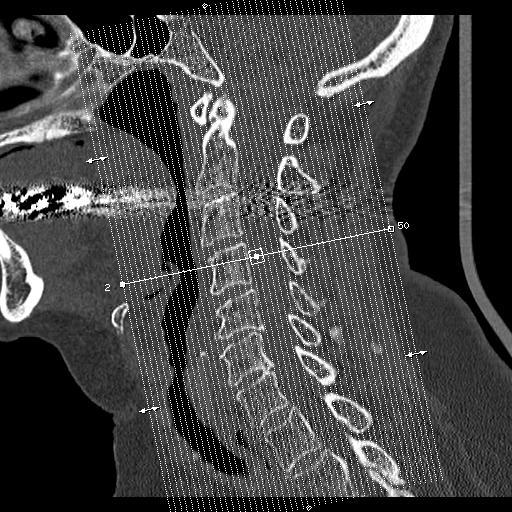

[Series 605: sag bone 2x2 · sagittal · 0.35mm/px · 5 of 47 slices shown, 6 images]
[im 16/47  bone]
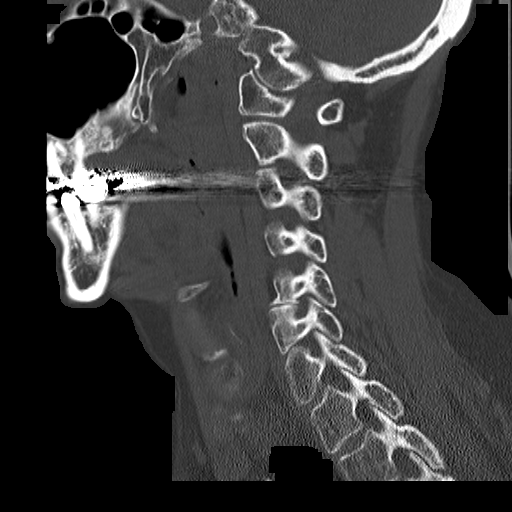
[im 20/47  bone]
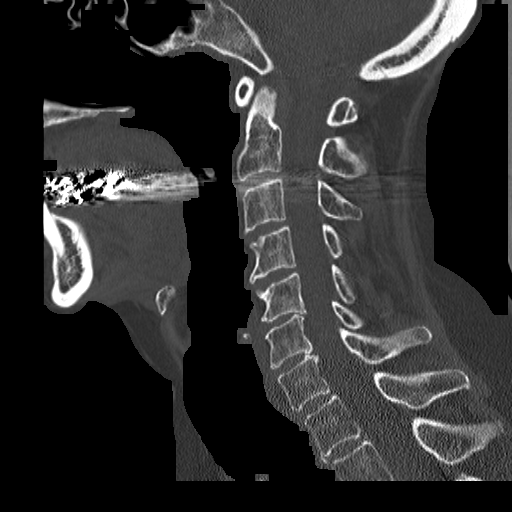
[im 24/47  soft-tissue]
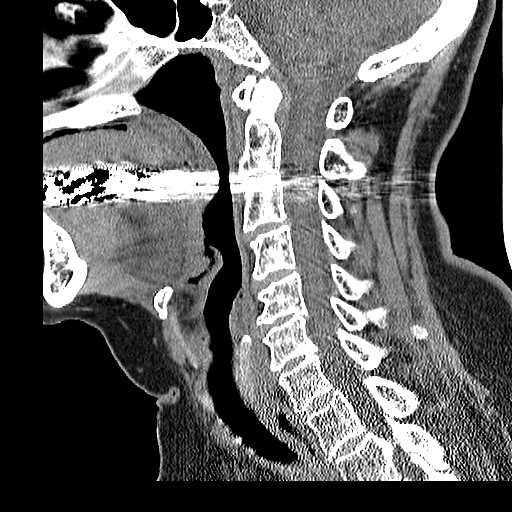
[im 24/47  bone]
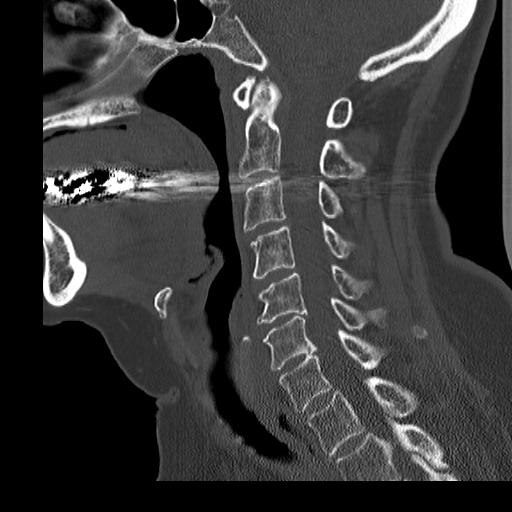
[im 27/47  bone]
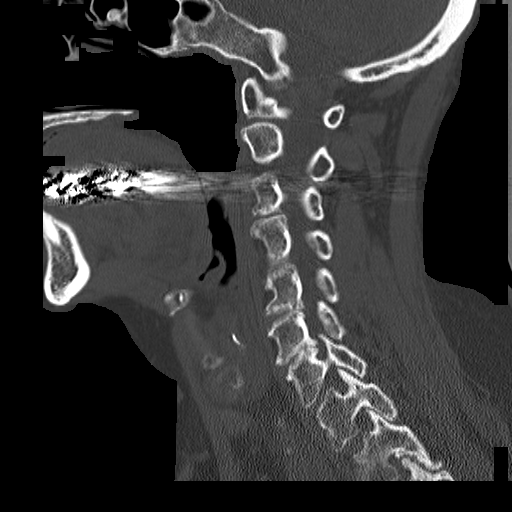
[im 31/47  bone]
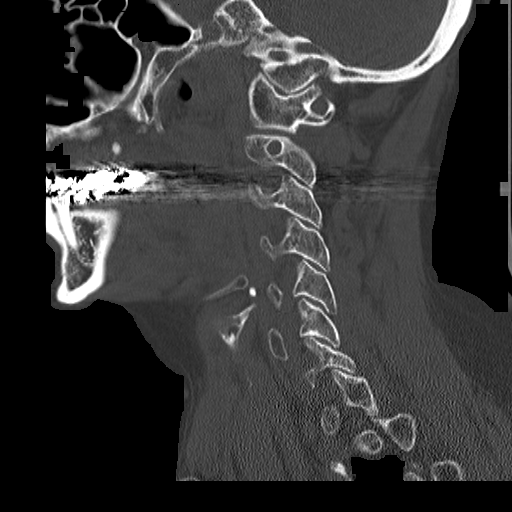

[Series 608: axial bone · axial · 0.35mm/px · z∈[-218,-4]mm · 3 of 42 slices shown, 4 images]
[im 1/42  soft-tissue]
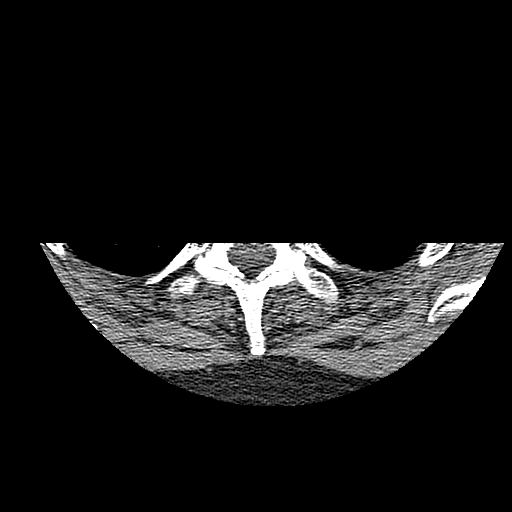
[im 1/42  bone]
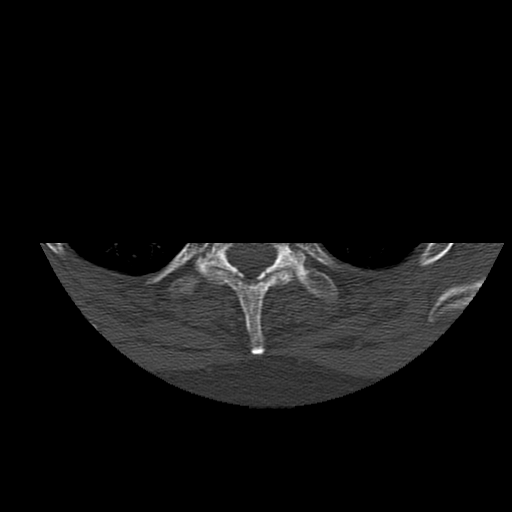
[im 21/42  bone]
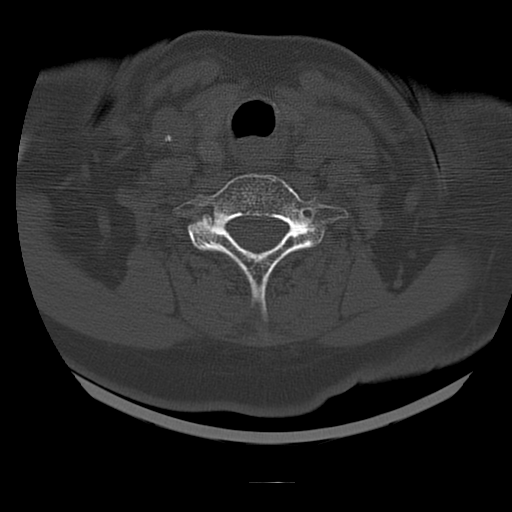
[im 42/42  bone]
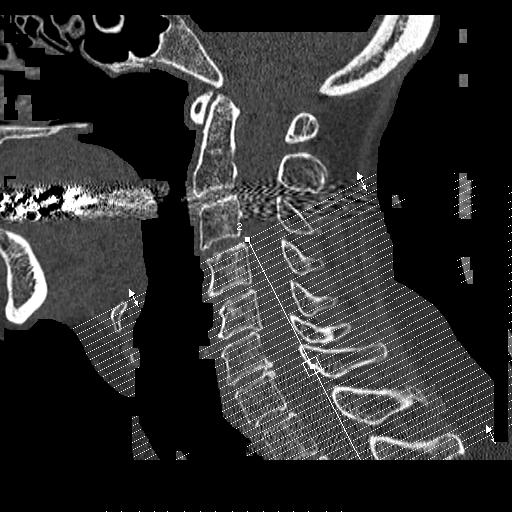

[15 of 33 positions shown; findings below may reference images not displayed]

FINDINGS: Normal cervical alignment is demonstrated.  No fracture or dislocation is present. Decreased disc space heights at C5-6 and C6-7 with spondylosis. Surrounding soft tissue structures are unremarkable.  Limited imaging of the superior lungs demonstrate no acute abnormalities. Calcified plaque of the carotid arteries bilaterally.
IMPRESSION: No acute fracture or traumatic subluxation.
All CT scans at this facility use dose modulation, iterative reconstruction, and/or weight-based dosing when appropriate to reduce radiation dose to as low as reasonably achievable.
LOCATION CODE: 1

## 2021-05-04 IMAGING — CT CT HEAD WITHOUT CONTRAST
2 of 3 series · 15 of 40 positions shown, 18 images · IV contrast (agent unspecified)
Comparison: none

FINAL REPORT:
CT OF THE HEAD WITHOUT CONTRAST:
CLINICAL INDICATION: fall
REFERENCE: None
TECHNIQUE: Multiple axial images were obtained through the brain without the administration of contrast.

[Series 2: head stnd · axial · 0.49mm/px · z∈[+8,+169]mm · 12 of 36 slices shown, 15 images]
[im 3/36  brain]
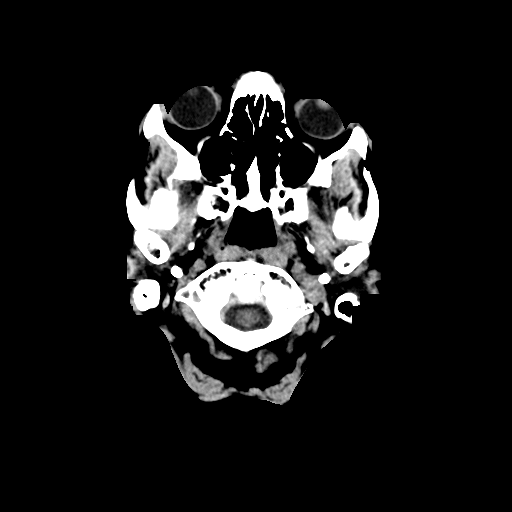
[im 3/36  bone]
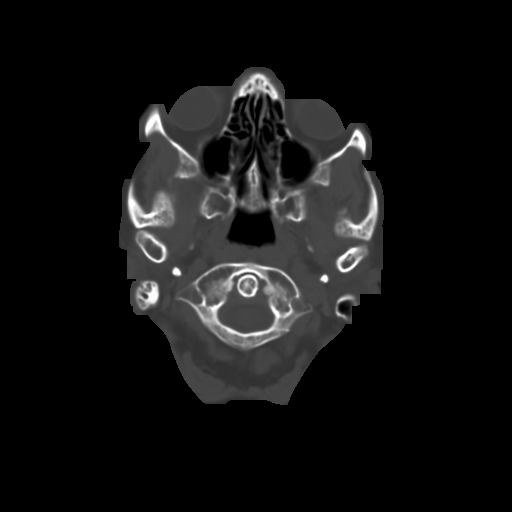
[im 5/36  brain]
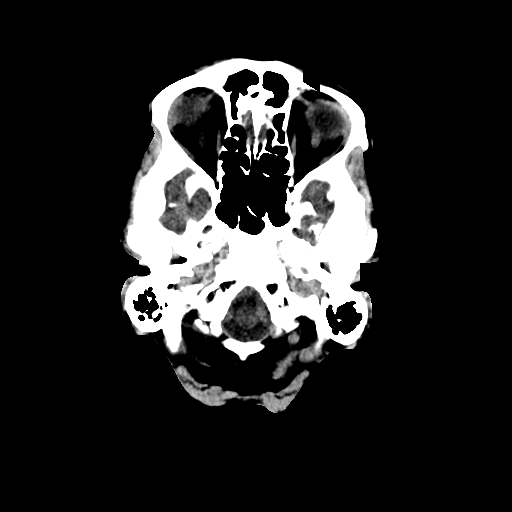
[im 8/36  brain]
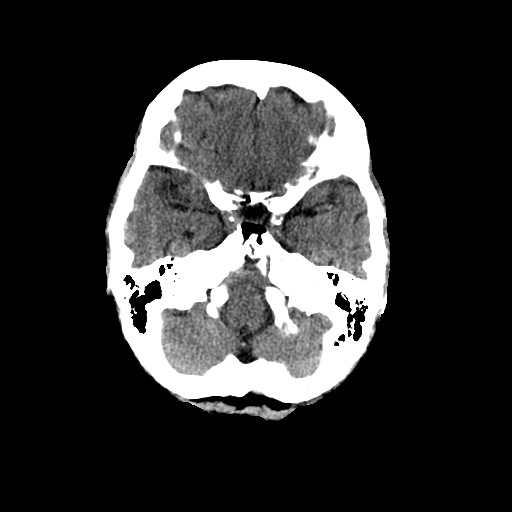
[im 11/36  brain]
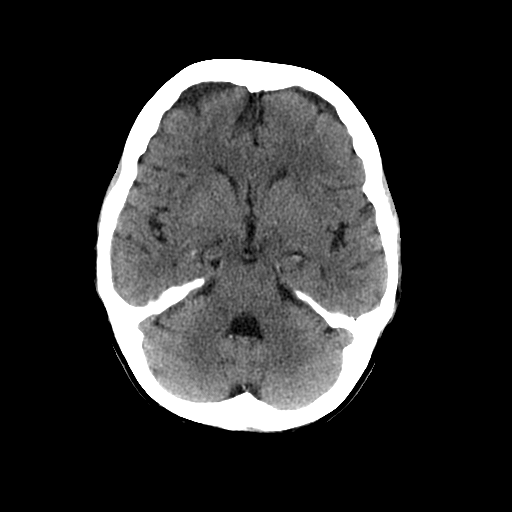
[im 14/36  brain]
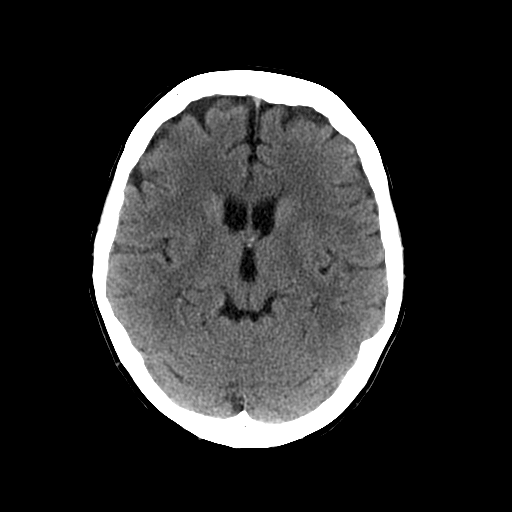
[im 14/36  bone]
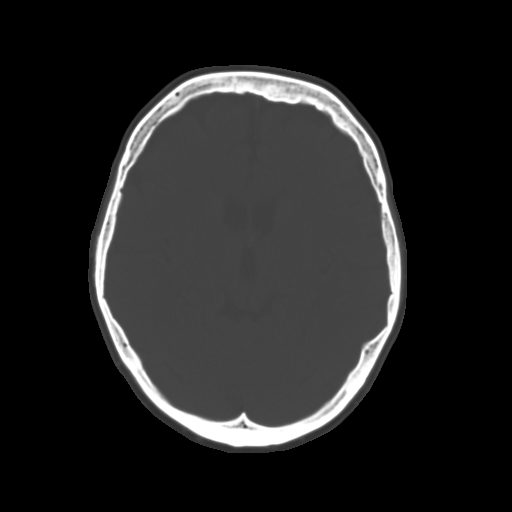
[im 16/36  brain]
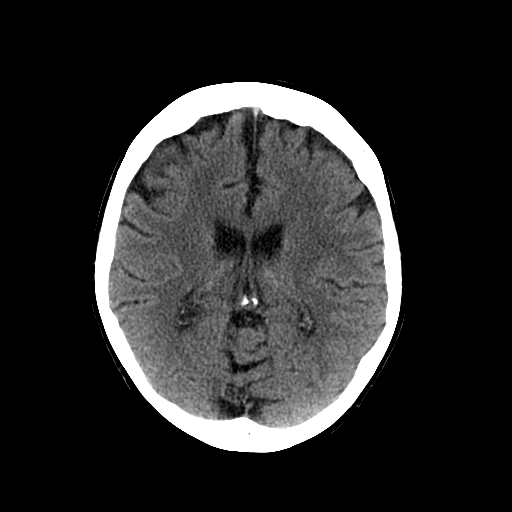
[im 20/36  brain]
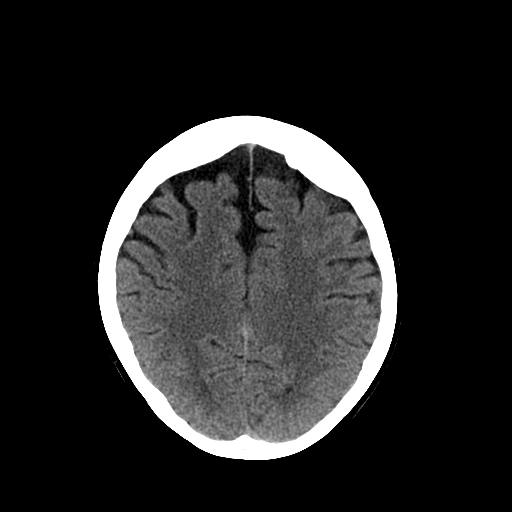
[im 22/36  brain]
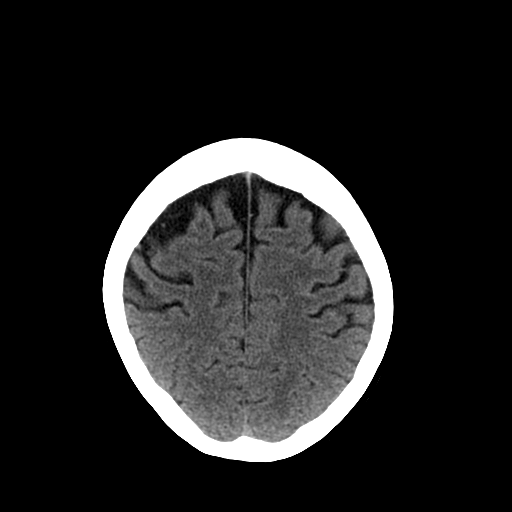
[im 25/36  brain]
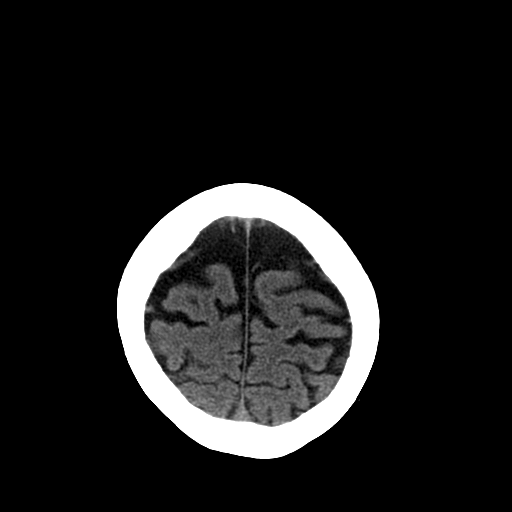
[im 25/36  bone]
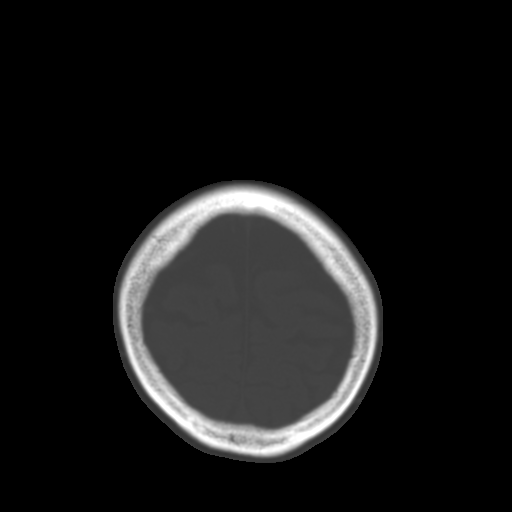
[im 28/36  brain]
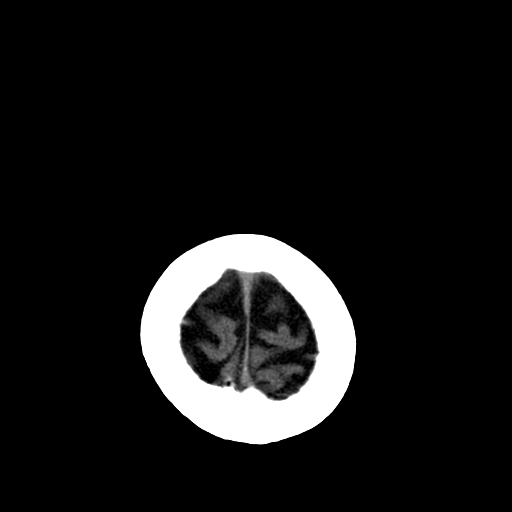
[im 31/36  brain]
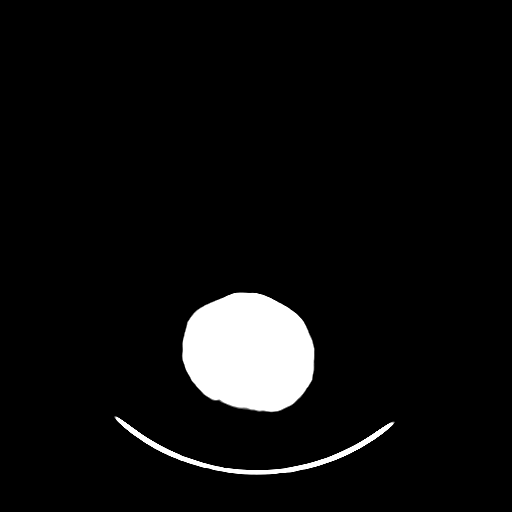
[im 33/36  brain]
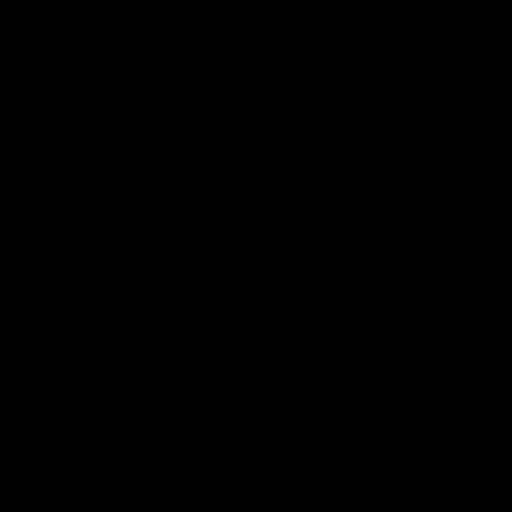

[Series 601: cor head · coronal · 0.49mm/px · 3 of 106 slices shown]
[im 52/106  brain]
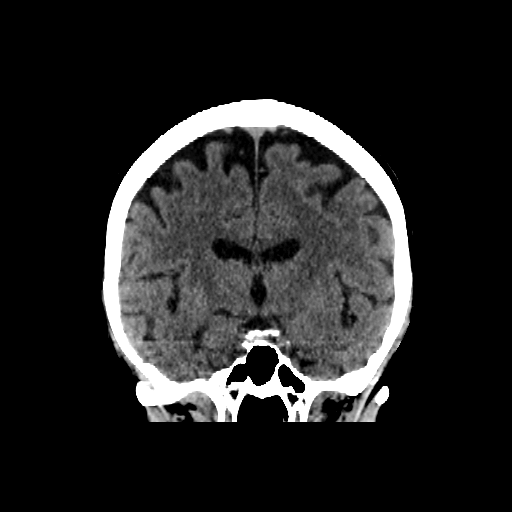
[im 61/106  brain]
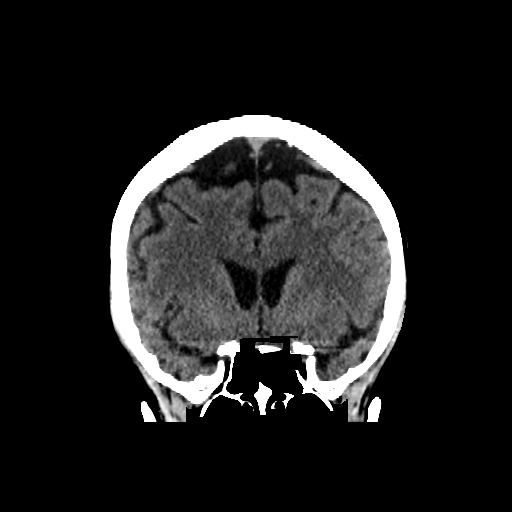
[im 70/106  brain]
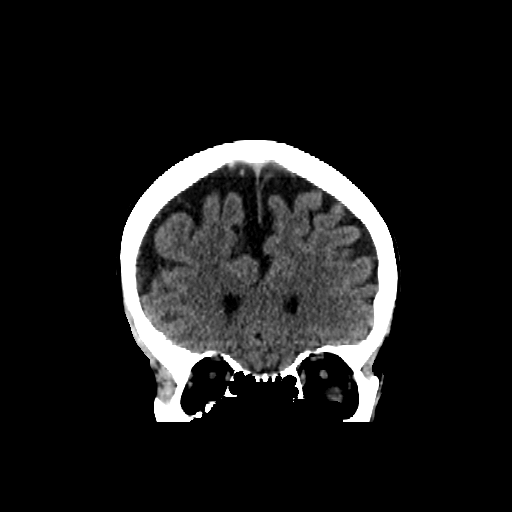

[15 of 40 positions shown; findings below may reference images not displayed]

FINDINGS: Ventricles and sulci are prominent compatible cortical volume loss. There is no shift of midline structures or mass effect. No intracranial hemorrhage or abnormal extra-axial fluid collections. Mild-to-moderate hypodensities of the periventricular deep white matter. Calvarium is intact. Visualized paranasal sinuses are aerated. There is calcified plaque of the cavernous and supraclinoid internal carotid arteries.
IMPRESSION: Cortical volume loss with periventricular hypodensities likely related to microischemic changes. No intracranial hemorrhage or mass effect.
All CT scans at this facility use dose modulation, iterative reconstruction, and/or weight-based dosing when appropriate to reduce radiation dose to as low as reasonably achievable.
LOCATION CODE: 1

## 2021-05-05 IMAGING — MR MRI HEAD ANGIO WITHOUT IV CONTRAST
4 of 8 series · 26 of 48 positions shown · non-contrast
Comparison: MRI brain 05/04/2021

FINAL REPORT:
MRI HEAD ANGIO WITHOUT IV CONTRAST
INDICATION: Neurologic deficit.
TECHNIQUE: 3-D time-of-flight MRA of the head was performed without contrast. MIP/3-D reconstructions were submitted for evaluation.

[Series 5: MRA · axial · 0.5mm · 0.39mm/px · z∈[-50,+29]mm · 17 of 168 slices shown]
[im 1/168]
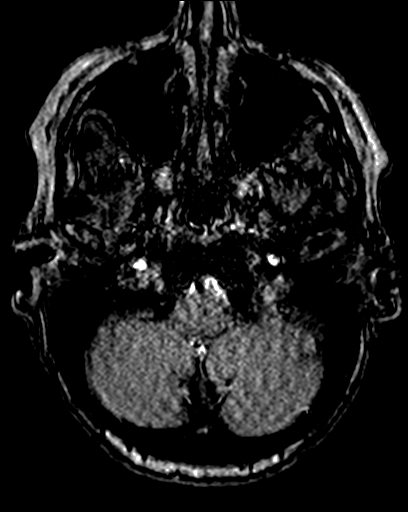
[im 9/168]
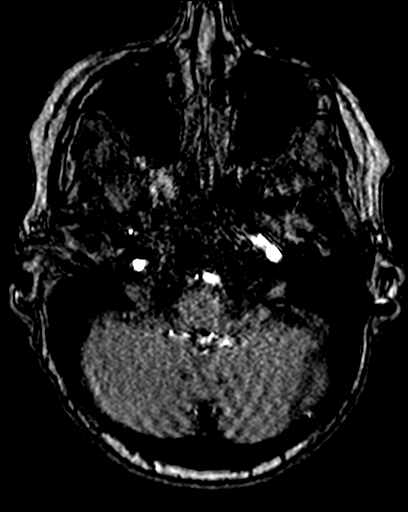
[im 17/168]
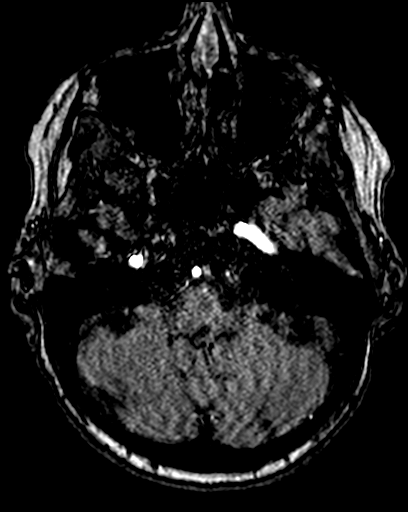
[im 26/168]
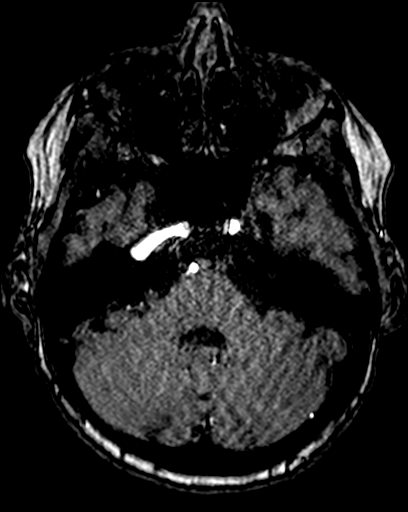
[im 34/168]
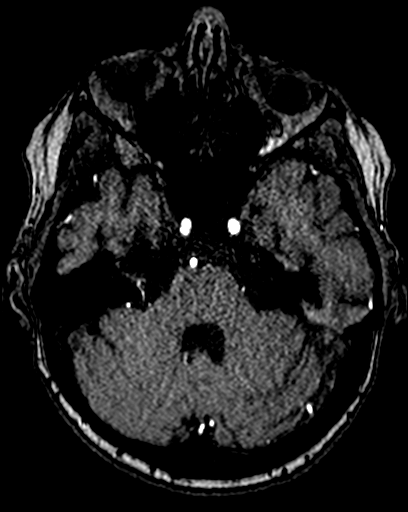
[im 42/168]
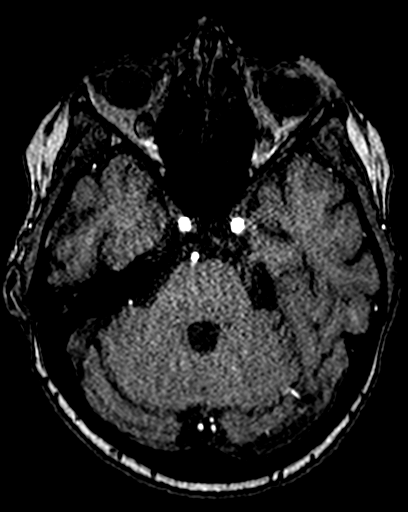
[im 51/168]
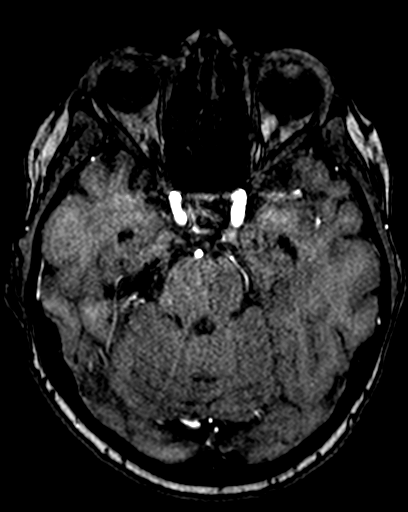
[im 59/168]
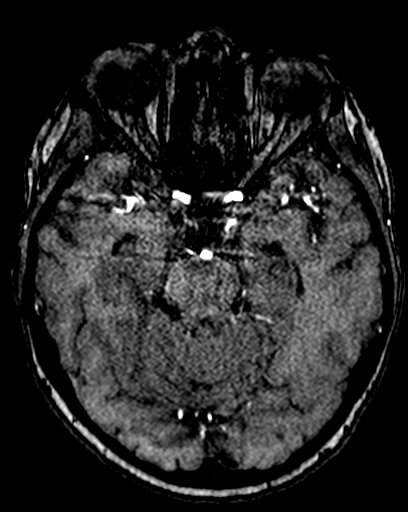
[im 67/168]
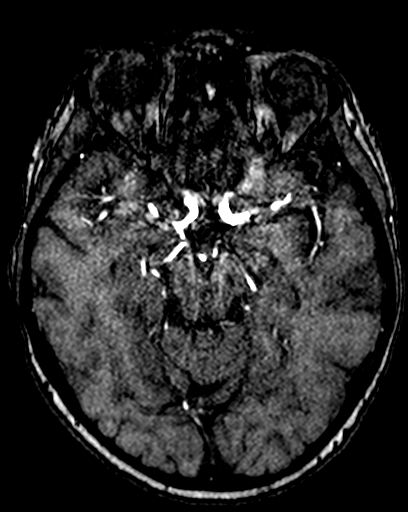
[im 76/168]
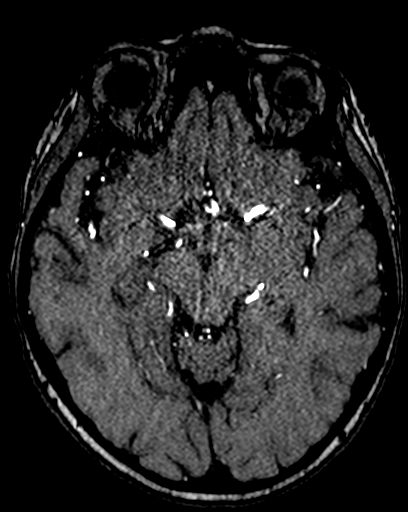
[im 84/168]
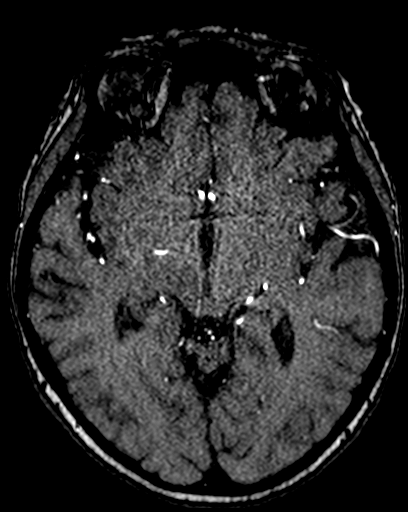
[im 92/168]
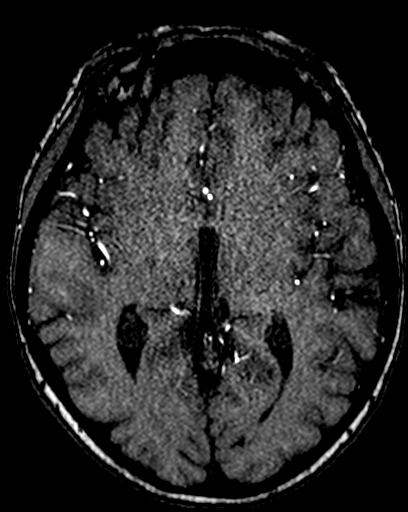
[im 101/168]
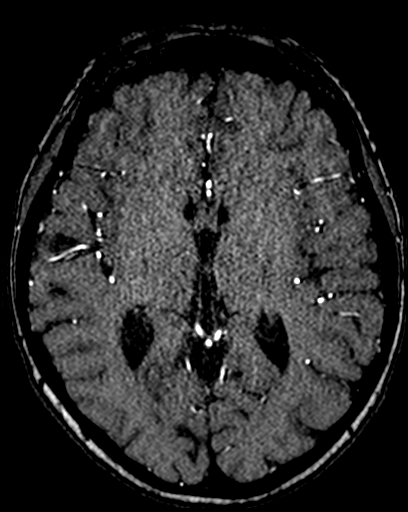
[im 109/168]
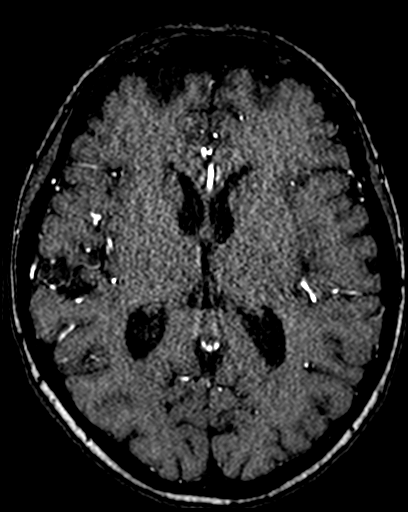
[im 117/168]
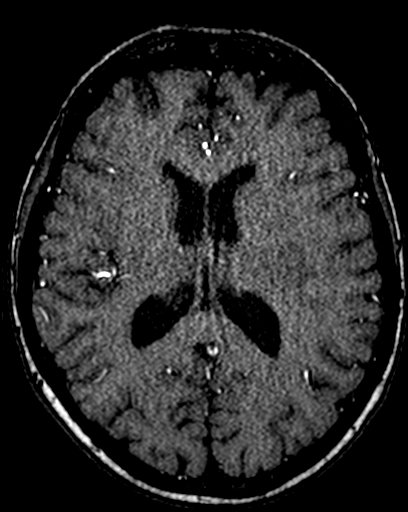
[im 142/168]
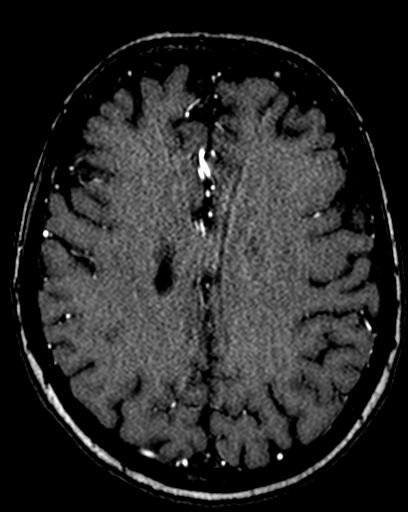
[im 159/168]
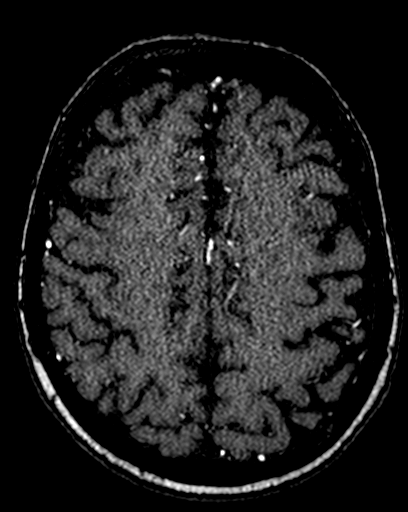

[Series 10: ax dwi_tracew · axial · 5.0mm · 0.60mm/px · z∈[-64,+74]mm · 3 of 24 slices shown]
[im 1/24]
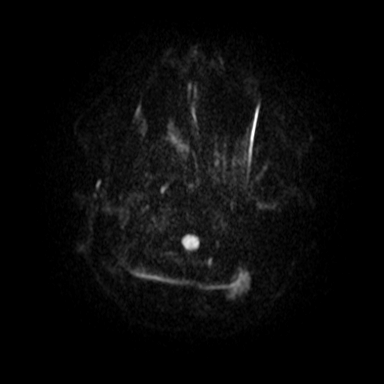
[im 12/24]
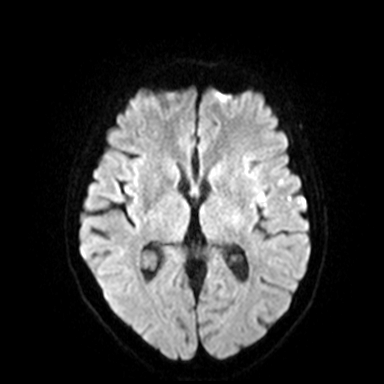
[im 24/24]
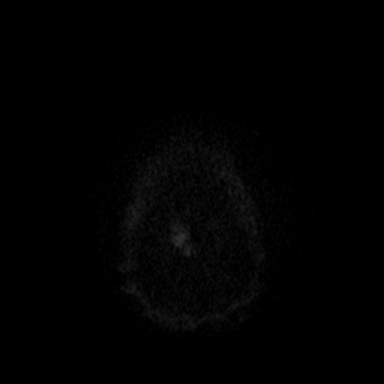

[Series 11: ax dwi_adc · axial · 5.0mm · 0.60mm/px · z∈[-64,+74]mm · 3 of 24 slices shown]
[im 1/24]
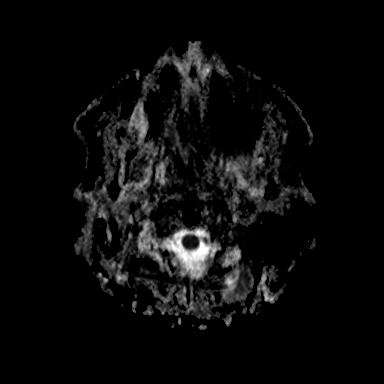
[im 12/24]
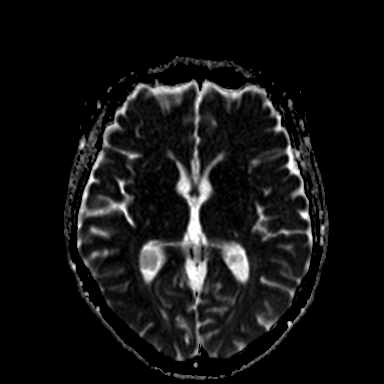
[im 24/24]
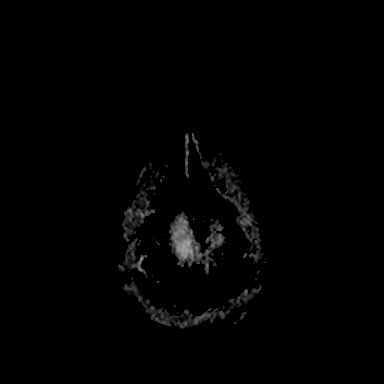

[Series 12: ax dwi_exp · axial · 5.0mm · 0.60mm/px · z∈[-64,+74]mm · 3 of 24 slices shown]
[im 1/24]
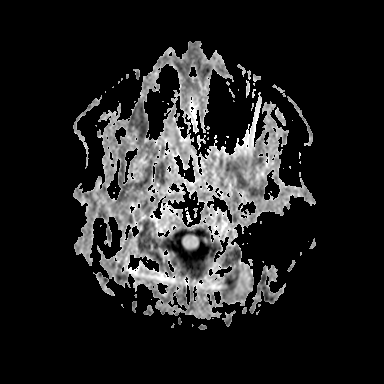
[im 12/24]
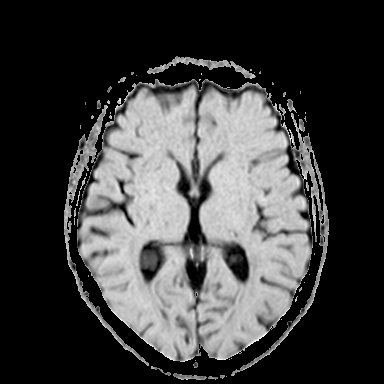
[im 24/24]
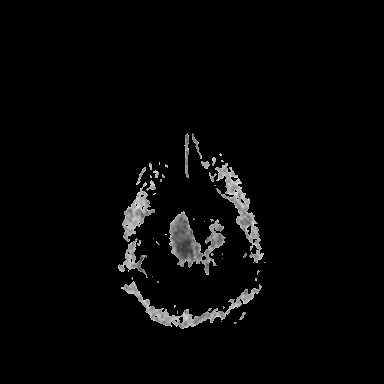

[26 of 48 positions shown; findings below may reference images not displayed]

FINDINGS: The imaged portions of the vertebral arteries are within normal limits. Bilateral anterior inferior cerebellar arteries are seen. The posterior inferior cerebellar arteries are not included in the field-of-view. The basilar artery is normal. The superior cerebellar and posterior cerebral arteries demonstrate no significant stenosis. There is a left posterior communicating artery. There is an accessory right posterior cerebral artery arising from the right carotid terminus.
The internal carotid, middle cerebral, and anterior cerebral arteries are within normal limits.
There are multiple small foci of restricted diffusion in the left corona radiata, left posterior frontal cortex and subcortical white matter, left frontal operculum, and left superior insula which were also seen on the prior study.
IMPRESSION: 
IMPRESSION: 1. No large vessel occlusion or hemodynamically significant stenosis.
2. Multiple small foci of acute infarct in the left MCA territory involving the left posterior frontal lobe, superior insula, and deep white matter which were also seen on the prior study.

## 2021-06-01 IMAGING — CT CT HEAD WITHOUT CONTRAST
2 of 3 series · 15 of 40 positions shown, 18 images · non-contrast
Comparison: none

FINAL REPORT:
INDICATION: Neuro deficit
Procedure: Nonenhanced images were acquired through the brain.
Comparisons: May 04, 2021

[Series 2: head stnd · axial · 0.45mm/px · z∈[-5,+126]mm · 12 of 32 slices shown, 15 images]
[im 3/32  brain]
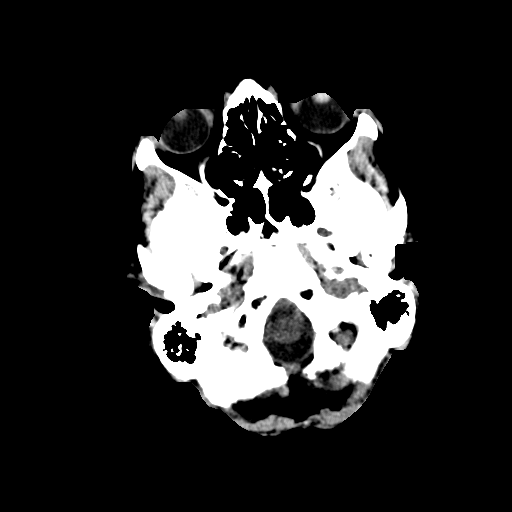
[im 3/32  bone]
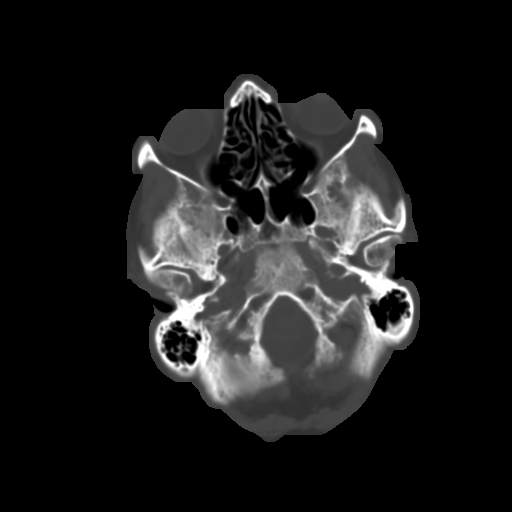
[im 5/32  brain]
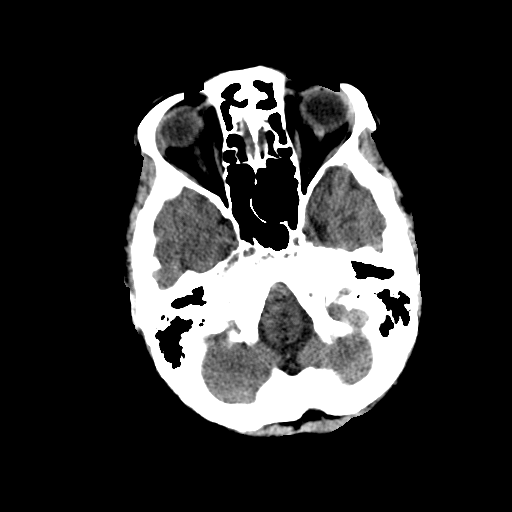
[im 7/32  brain]
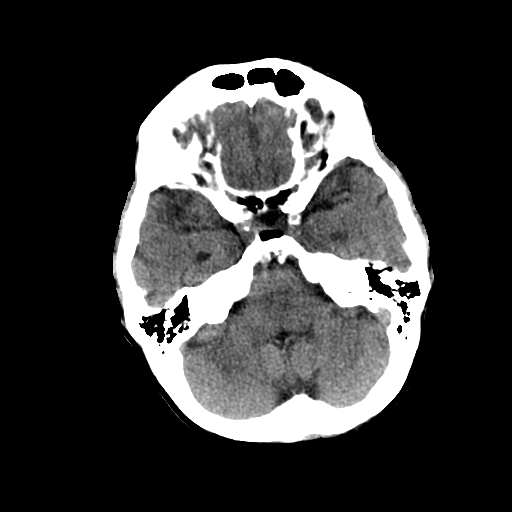
[im 10/32  brain]
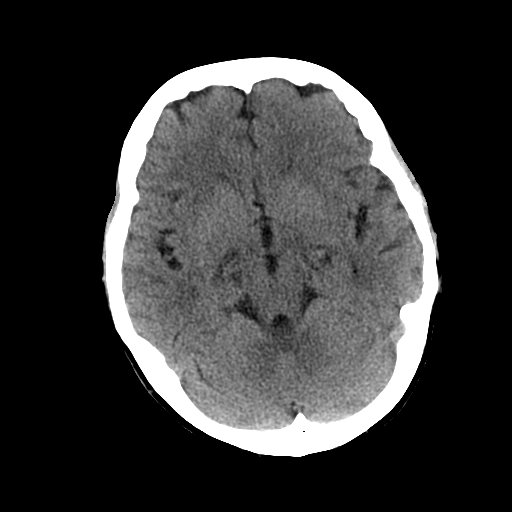
[im 12/32  brain]
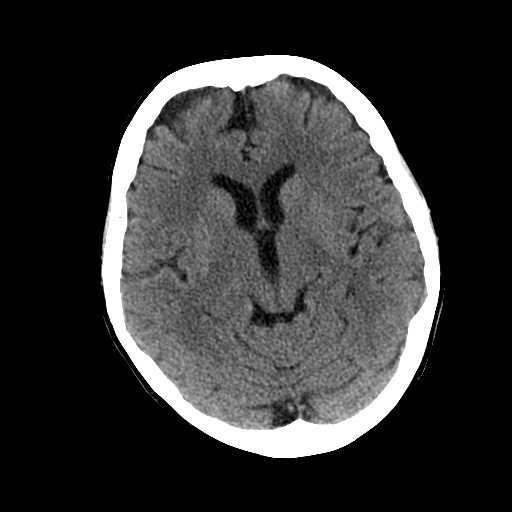
[im 12/32  bone]
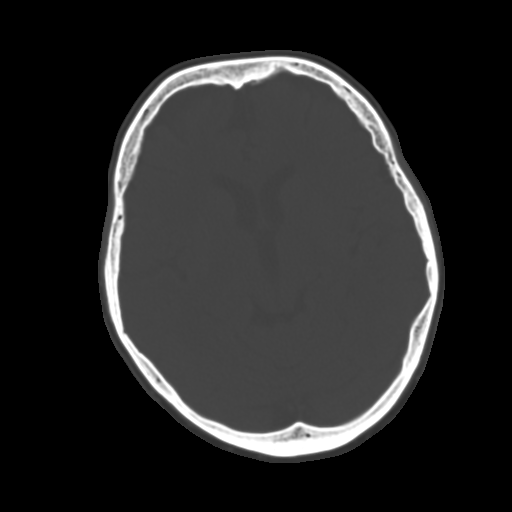
[im 14/32  brain]
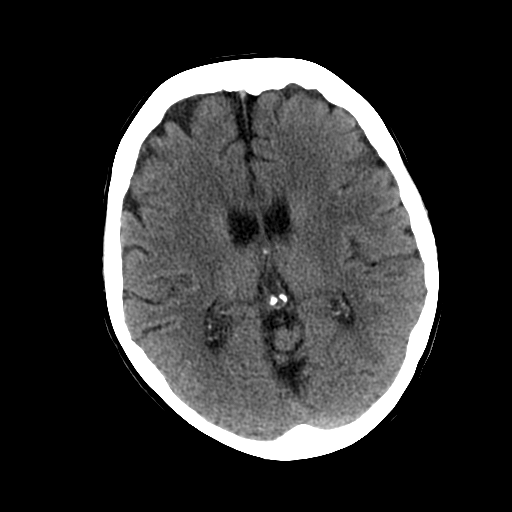
[im 18/32  brain]
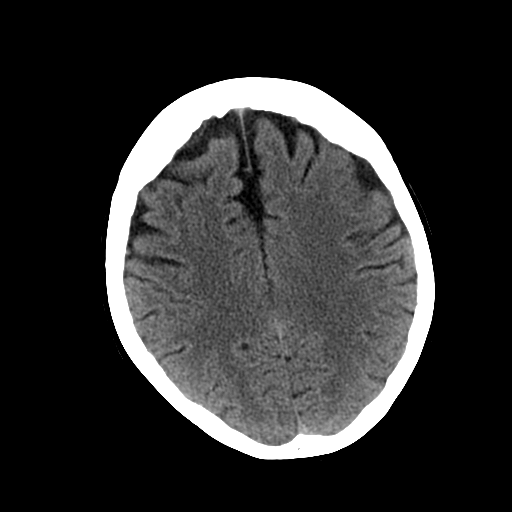
[im 20/32  brain]
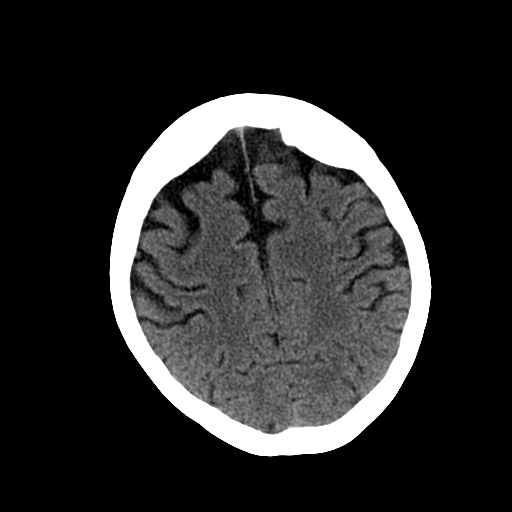
[im 22/32  brain]
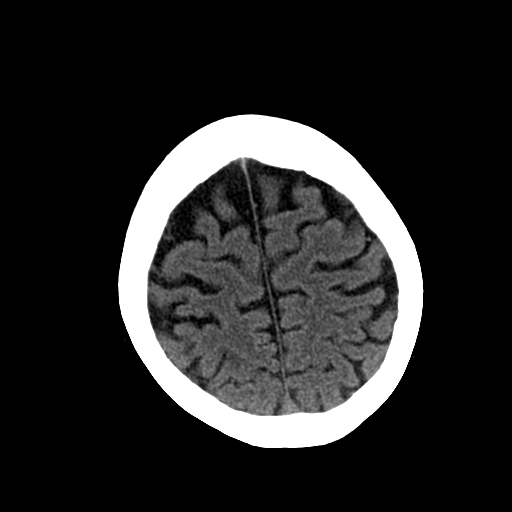
[im 22/32  bone]
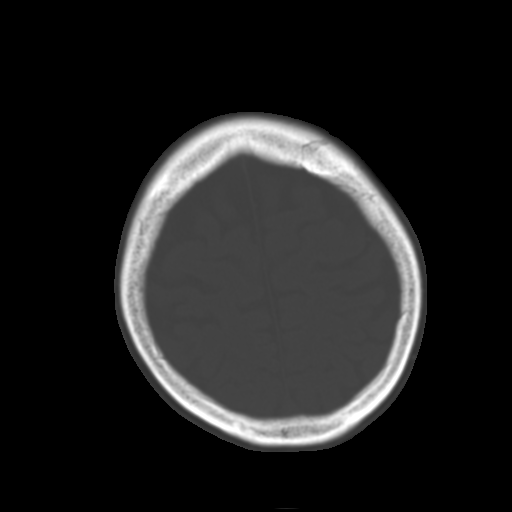
[im 25/32  brain]
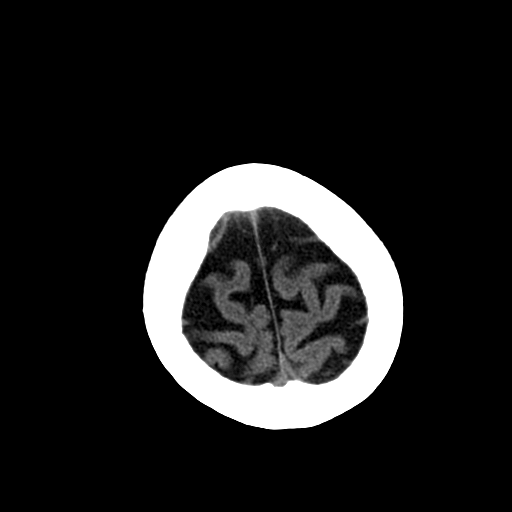
[im 27/32  brain]
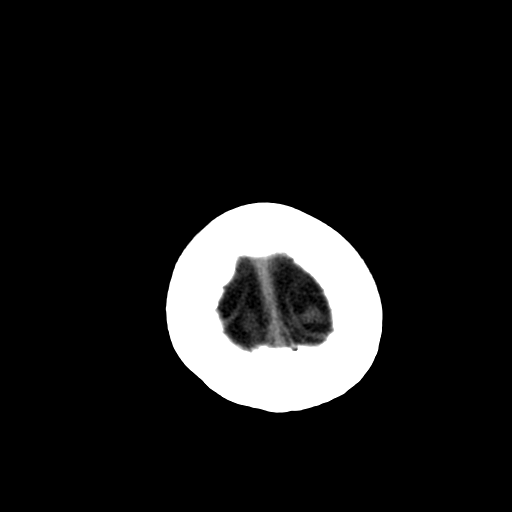
[im 29/32  brain]
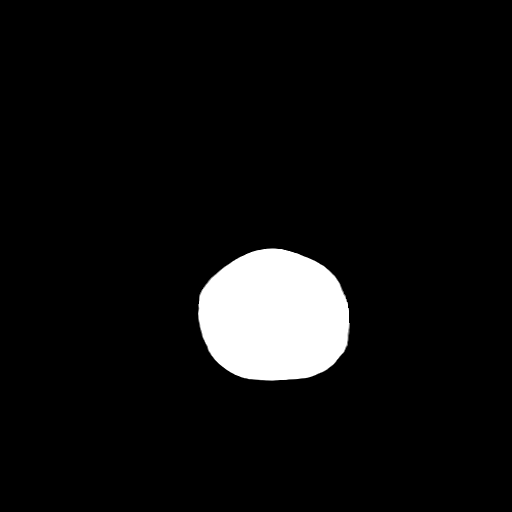

[Series 601: cor head · coronal · 0.45mm/px · 3 of 105 slices shown]
[im 21/105  brain]
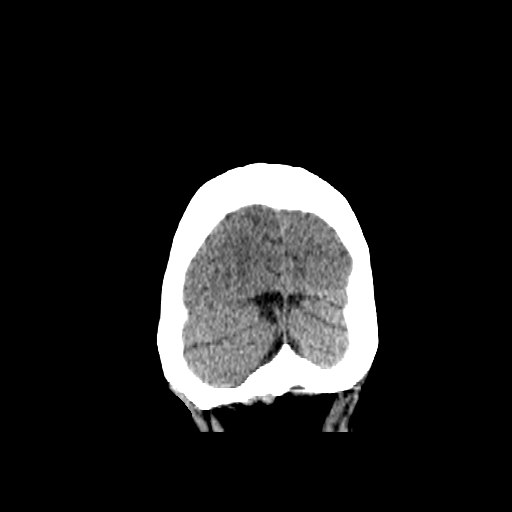
[im 42/105  brain]
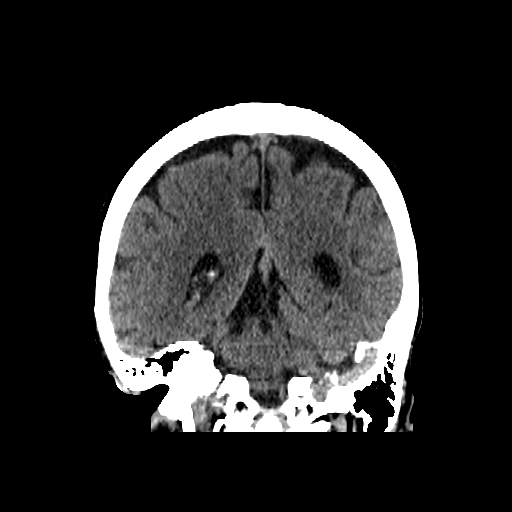
[im 63/105  brain]
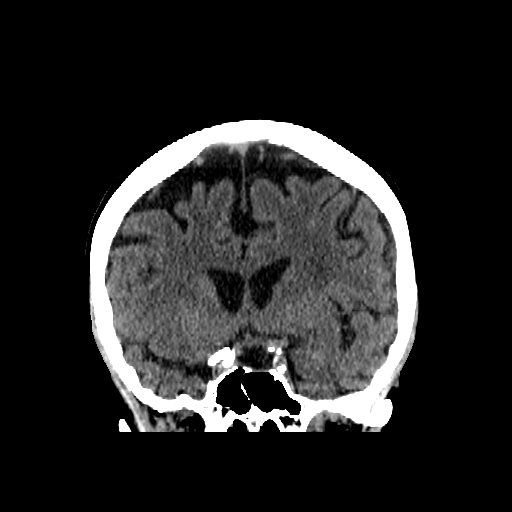

[15 of 40 positions shown; findings below may reference images not displayed]

FINDINGS: No mass or positive mass effect is identified. The ventricles are normal in contour, size, and position. Stable mild atrophy. No intracranial hemorrhage or other acute attenuation abnormality is appreciated. The insular ribbons are symmetrically visualized. Normal gray-white differentiation. No abnormal extra-axial fluid collection is identified. The mastoid air cells and visualized paranasal sinuses are clear. No middle ear effusion is identified. No skull lesion or fracture is identified.
IMPRESSION: 
IMPRESSION: No acute abnormality demonstrated.
All CT scans at this facility use iterative reconstruction technique, dose modulation and/or weight-based dosing when appropriate to reduce radiation dose to as low as reasonably achievable.

## 2021-06-01 IMAGING — CR XR CHEST 1 VIEW
1 series · 1 of 1 positions shown · non-contrast
Comparison: Chest exam from 05/04/2021.

FINAL REPORT:
AP chest one view
HISTORY: cva

[AP]
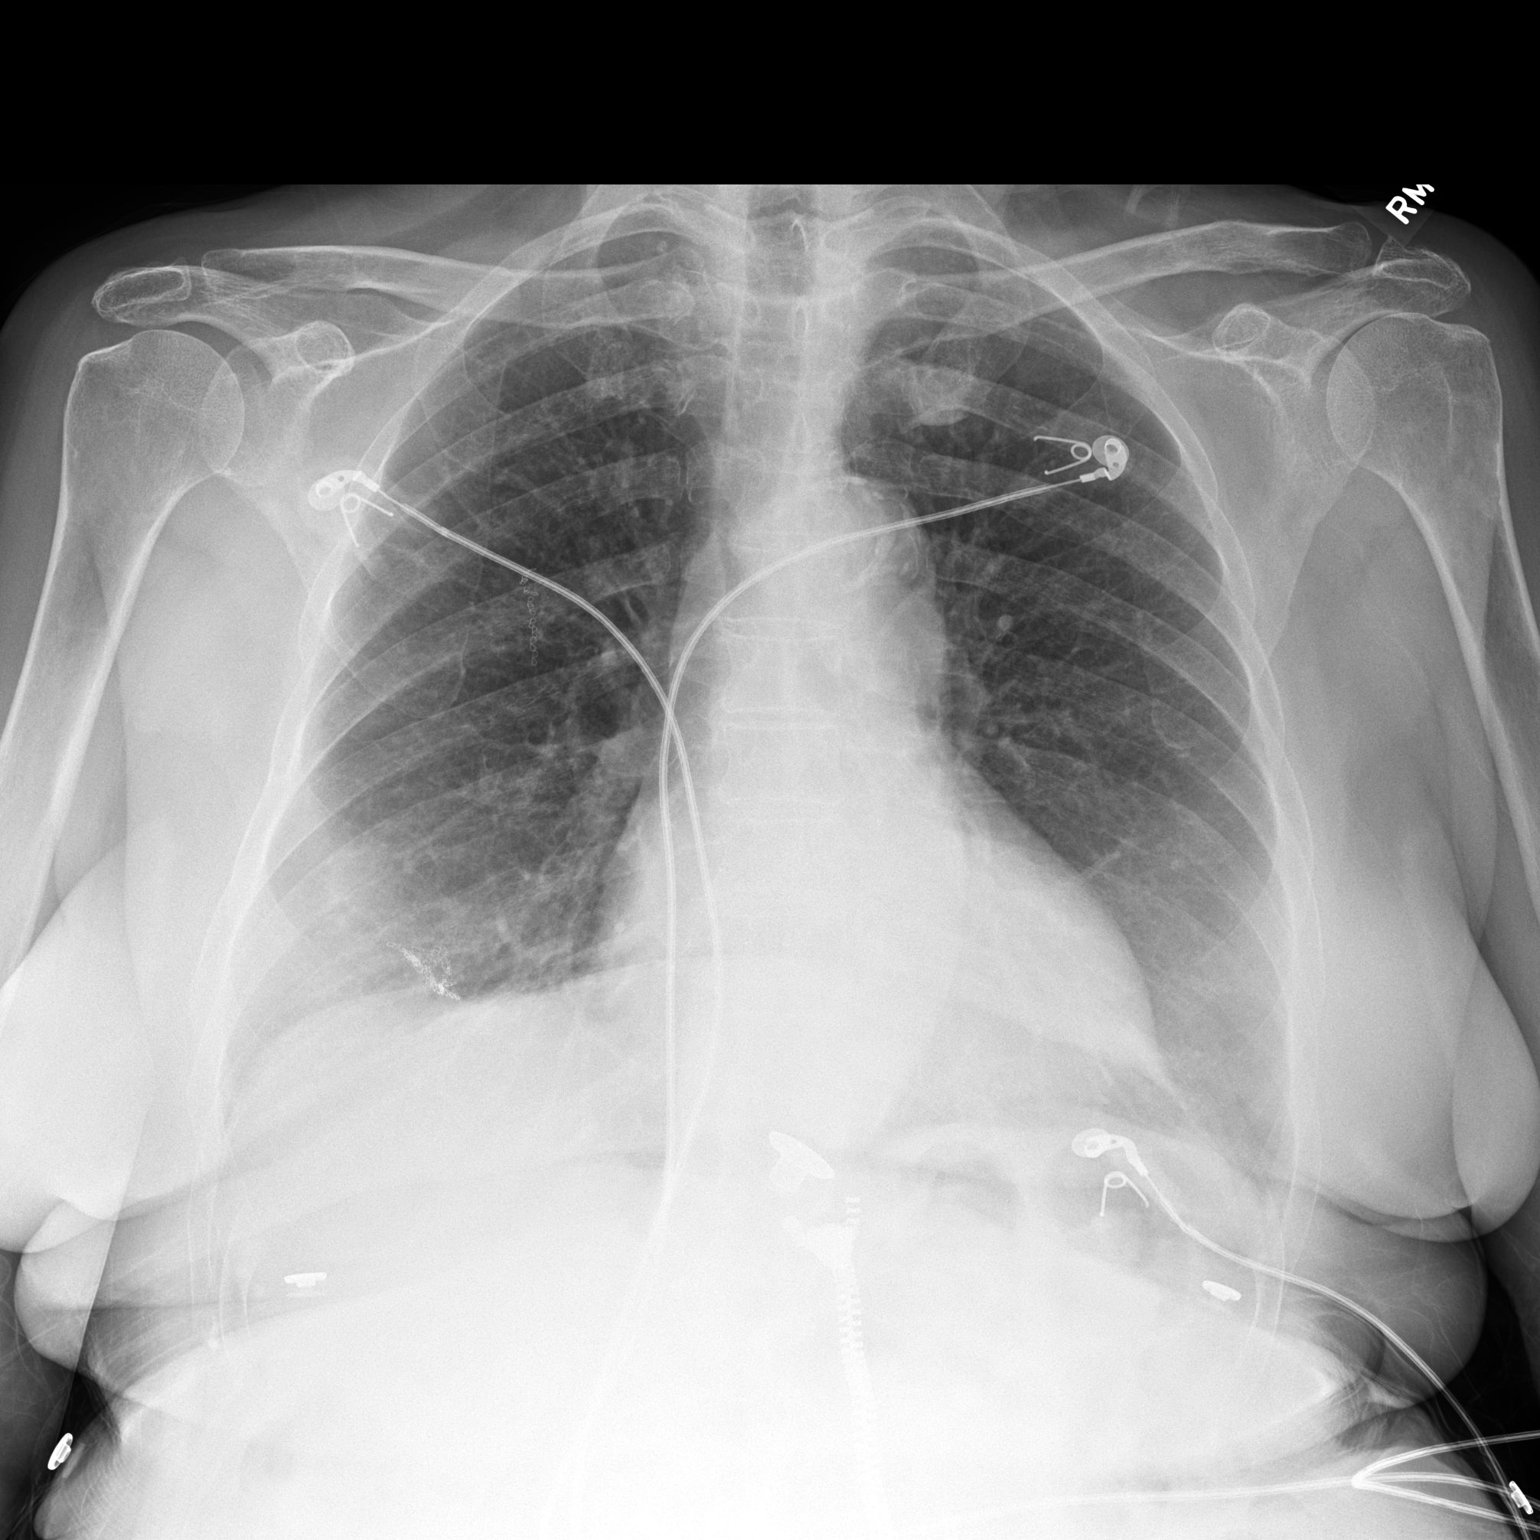

[1 of 1 positions shown; findings below may reference images not displayed]

FINDINGS: Cardiac silhouette is within normal limits. There are surgical sutures overlying the right lower lobe. There is partial bibasilar atelectasis increased. The aerated portions of the lungs are clear. No pneumothorax or pleural effusions.
IMPRESSION: Increased bibasilar atelectasis.
Portable?
Yes

## 2021-06-02 IMAGING — MR MRI BRAIN WITHOUT CONTRAST
12 series · 41 of 48 positions shown · non-contrast
Comparison: 05/04/2021, 06/01/2021

CVA 3 WEEKS AGO. [place] SPEECH/WORD FINDING PROBLEMS AND CONFUSION.
FINAL REPORT:
EXAM: MRI of the brain without contrast.
INDICATION: Stroke, follow up
Neuro deficit, acute, stroke suspected. . .
TECHNIQUE: MRI of the brain was performed using sagittal T1, coronal T2, axial FLAIR, axial T2, axial T1, axial gradient echo, axial diffusion, and ADC map sequences.

[Series 1: survey · sagittal · 1.6mm · 1.62mm/px · 9 of 128 slices shown]
[im 1/128]
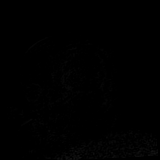
[im 9/128]
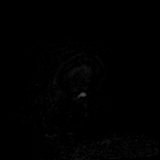
[im 26/128]
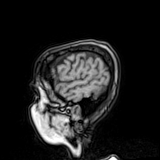
[im 43/128]
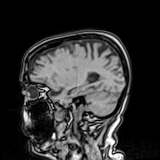
[im 60/128]
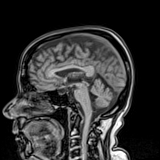
[im 77/128]
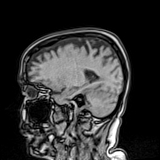
[im 94/128]
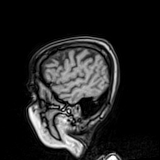
[im 111/128]
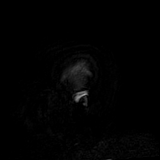
[im 128/128]
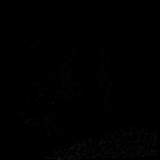

[Series 2: survey_mpr_sag · sagittal · 1.6mm · 1.60mm/px · 1 of 5 slices shown]
[im 1/5]
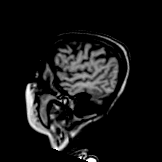

[Series 3: survey_mpr_cor · coronal · 1.6mm · 1.60mm/px · 1 of 3 slices shown]
[im 1/3]
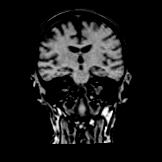

[Series 4: survey_mpr_tra · axial · 1.6mm · 1.60mm/px · 1 of 3 slices shown]
[im 1/3]
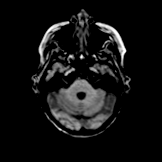

[Series 5: T1 · sagittal · 5.0mm · 0.72mm/px · 2 of 25 slices shown (1 of 2)]
[im 1/25]
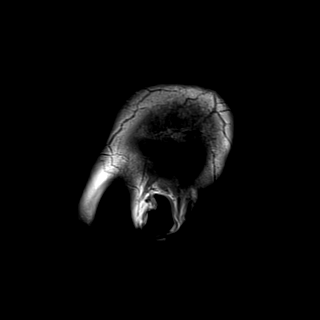
[im 25/25]
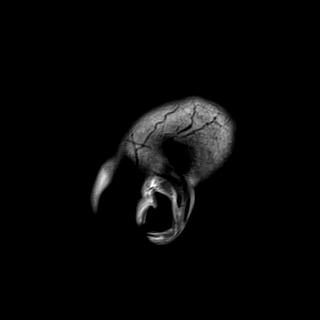

[Series 6: ax dwi_tracew · axial · 4.0mm · 0.60mm/px · z∈[-73,+72]mm · 3 of 27 slices shown]
[im 1/27]
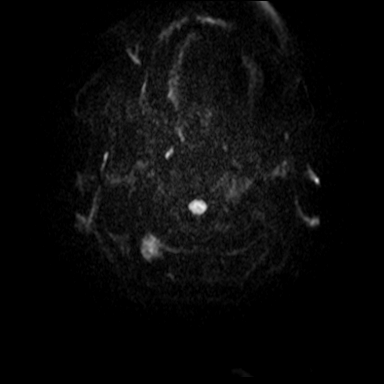
[im 14/27]
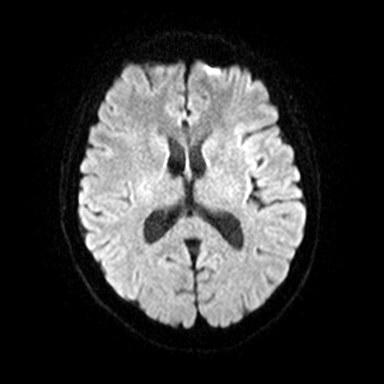
[im 27/27]
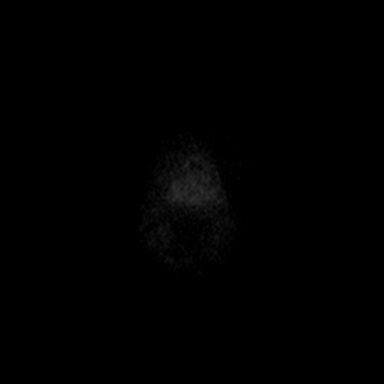

[Series 7: ax dwi_adc · axial · 4.0mm · 0.60mm/px · z∈[-73,+72]mm · 4 of 26 slices shown]
[im 1/26]
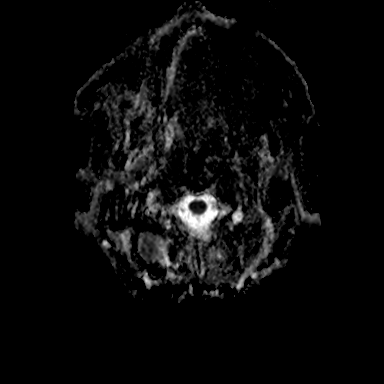
[im 9/26]
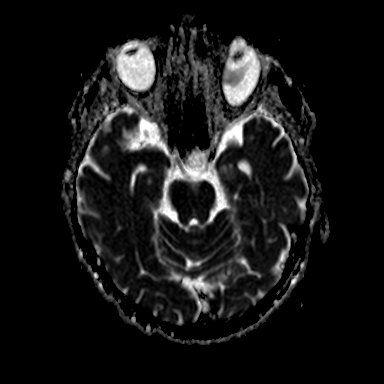
[im 17/26]
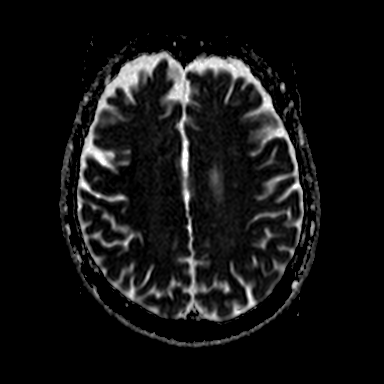
[im 26/26]
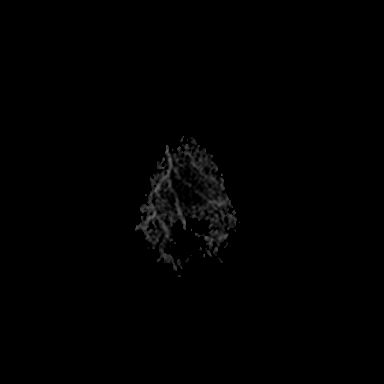

[Series 8: ax dwi_exp · axial · 4.0mm · 0.60mm/px · z∈[-73,+72]mm · 4 of 27 slices shown]
[im 1/27]
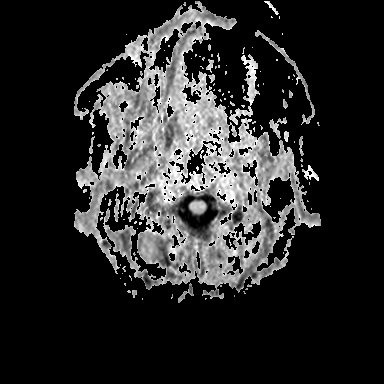
[im 9/27]
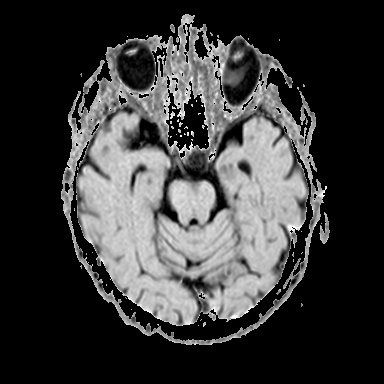
[im 18/27]
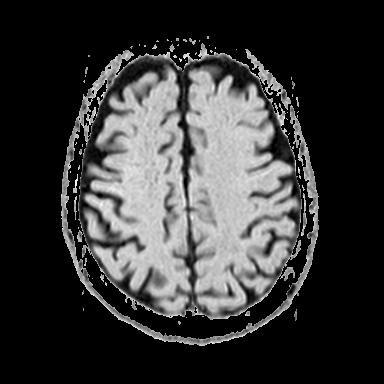
[im 27/27]
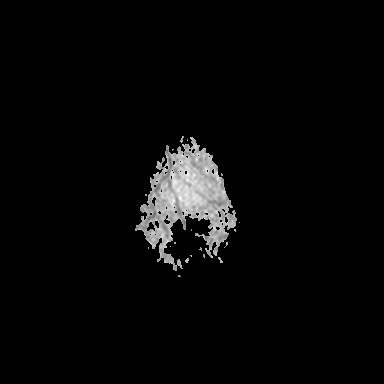

[Series 9: T2 · axial · 4.0mm · 0.45mm/px · z∈[-73,+72]mm · 4 of 27 slices shown]
[im 1/27]
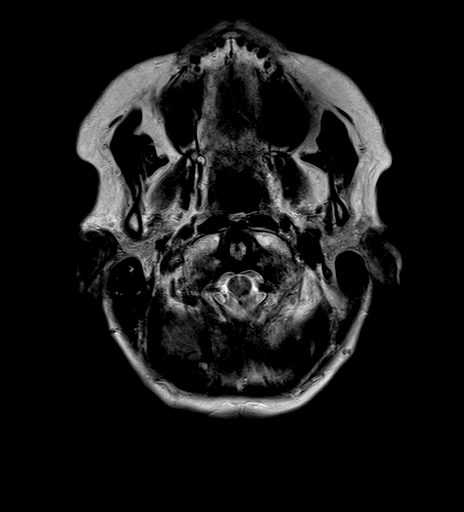
[im 9/27]
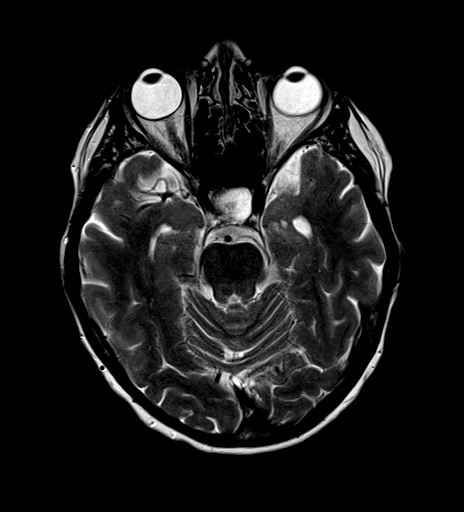
[im 18/27]
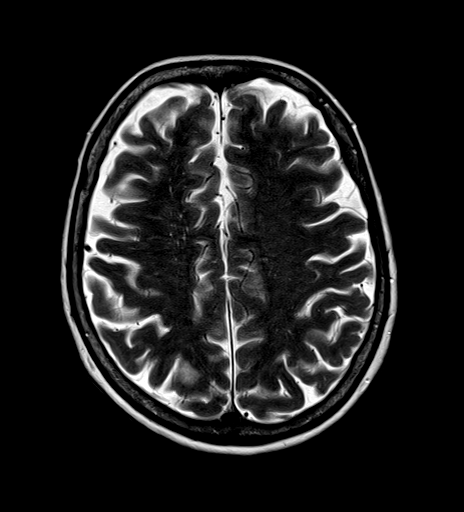
[im 27/27]
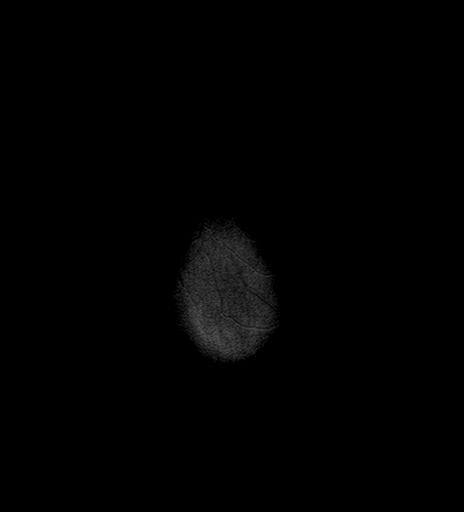

[Series 10: T1 · axial · 4.0mm · 0.72mm/px · z∈[-73,+72]mm · 4 of 27 slices shown (2 of 2)]
[im 1/27]
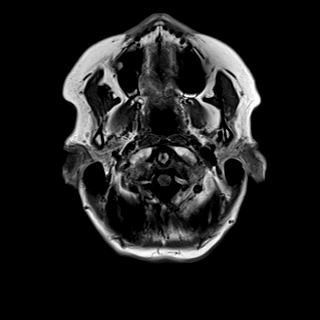
[im 9/27]
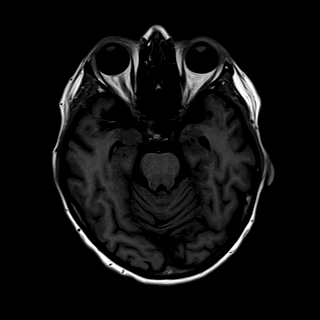
[im 18/27]
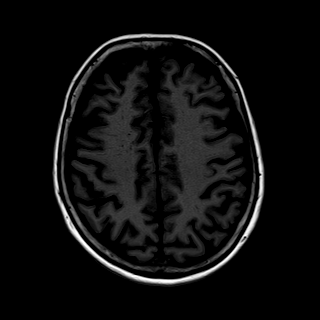
[im 27/27]
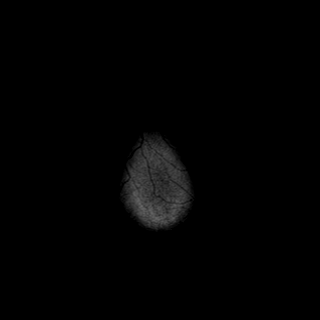

[Series 11: FLAIR · axial · 4.0mm · 0.72mm/px · z∈[-73,+72]mm · 4 of 27 slices shown]
[im 1/27]
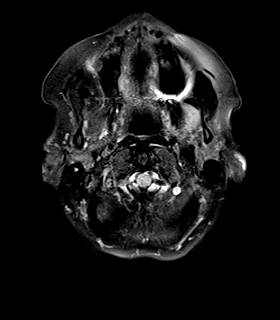
[im 9/27]
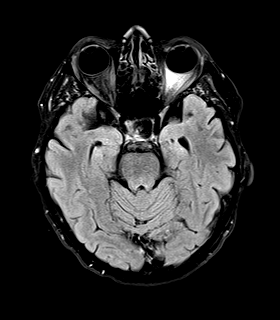
[im 18/27]
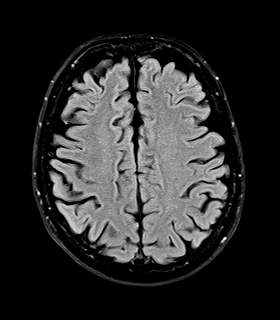
[im 27/27]
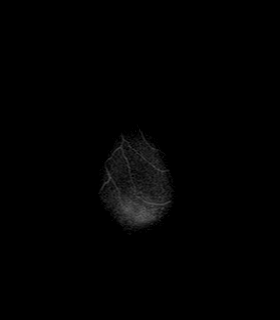

[Series 12: GRE · axial · 4.0mm · 0.45mm/px · z∈[-73,+72]mm · 4 of 27 slices shown]
[im 1/27]
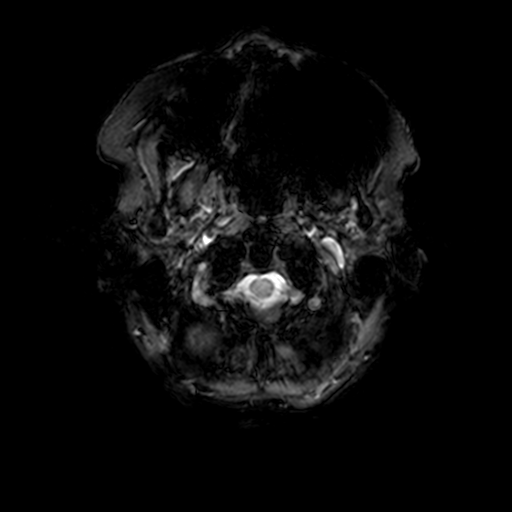
[im 9/27]
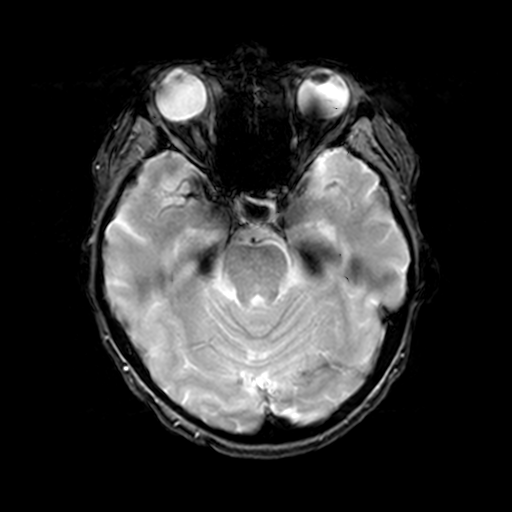
[im 18/27]
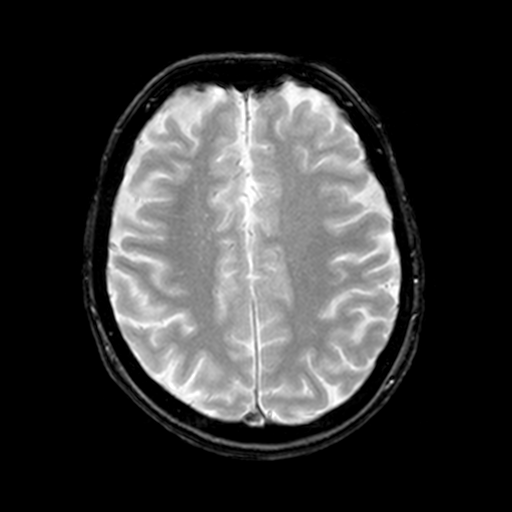
[im 27/27]
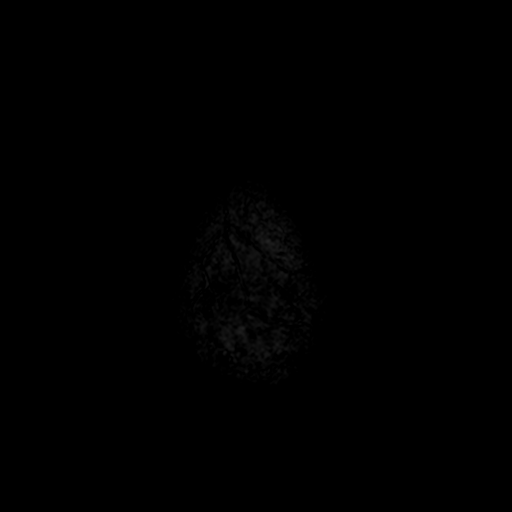

[41 of 48 positions shown; findings below may reference images not displayed]

FINDINGS: Diffusion restriction with FLAIR hyperintensity without 80 cc matter hypoattenuation in the left coronal radiata, consistent with progression of subacute white matter lacunar infarction.
No midline shift. Basal cisterns are patent.  Ventricles are normal and symmetric.
Mild periventricular and subcortical areas of white matter T2 hyperintensity consistent with a nonspecific loop encephalopathy, although likely correlating with chronic microangiopathy.
No evidence for acute infarct, intracranial hemorrhage or mass lesion. No extra-axial fluid collections.
Intracranial vascular flow voids are normal.
Orbital contents are unremarkable.
Sinuses and mastoids are clear.
IMPRESSION: Evolution of subacute lacunar infarction in left corona radiata white matter. No acute intracranial abnormalities.

## 2021-11-07 IMAGING — CT CT HEAD WITHOUT CONTRAST
2 of 3 series · 15 of 40 positions shown, 18 images · non-contrast
Comparison: 05/24/2021

FINAL REPORT:
CT HEAD WITHOUT CONTRAST
INDICATION: Mental status change, unknown cause
TECHNIQUE: Helical CT images from skull base to vertex without IV contrast. Dose reduction techniques were utilized for this examination.

[Series 2: head stnd · axial · 0.49mm/px · z∈[-55,+96]mm · 12 of 36 slices shown, 15 images]
[im 3/36  brain]
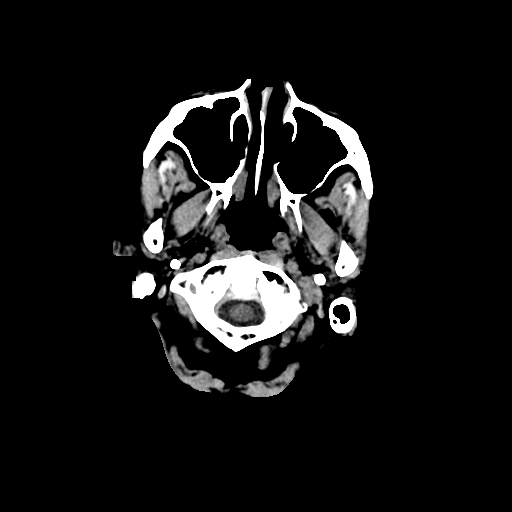
[im 3/36  bone]
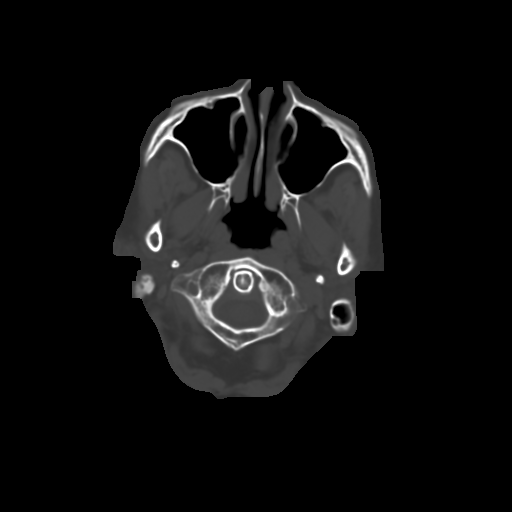
[im 5/36  brain]
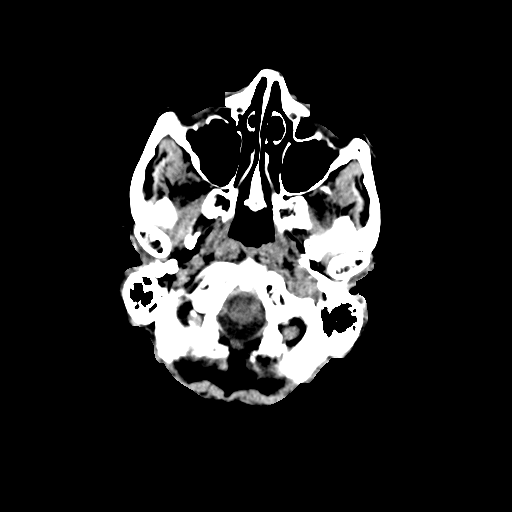
[im 8/36  brain]
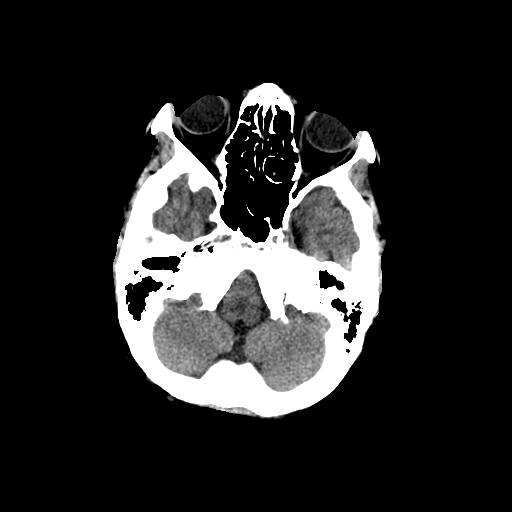
[im 11/36  brain]
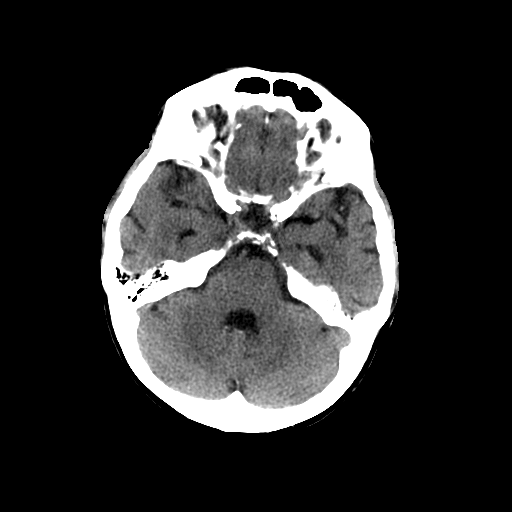
[im 14/36  brain]
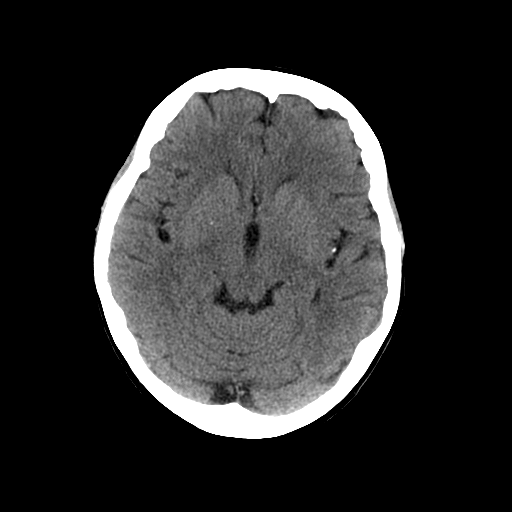
[im 14/36  bone]
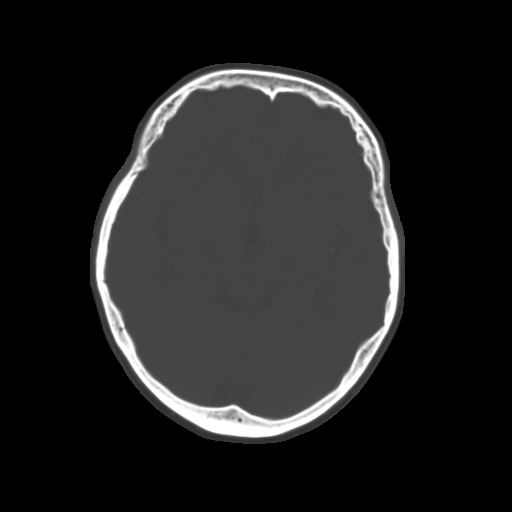
[im 16/36  brain]
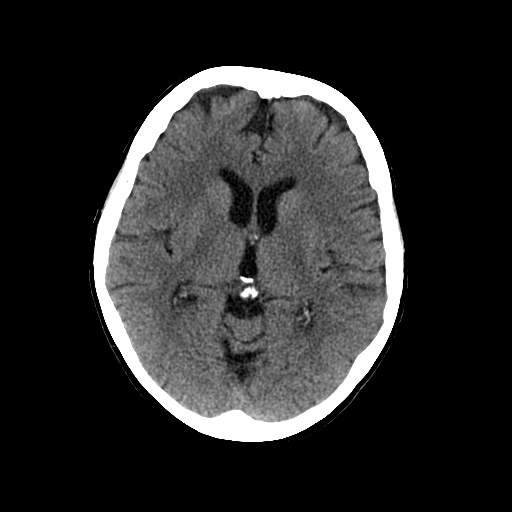
[im 20/36  brain]
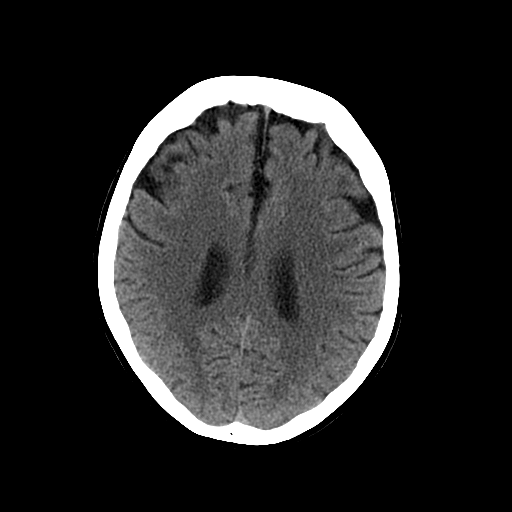
[im 22/36  brain]
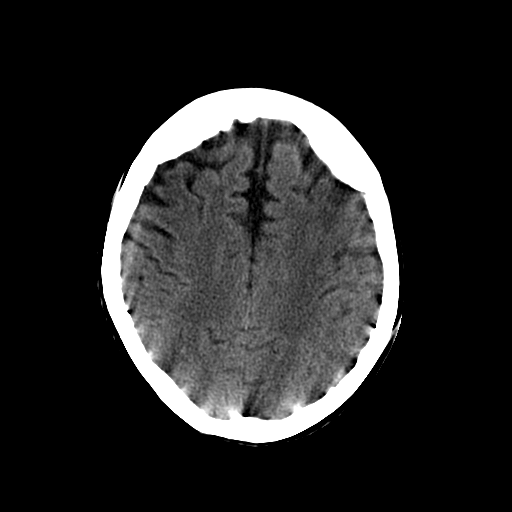
[im 25/36  brain]
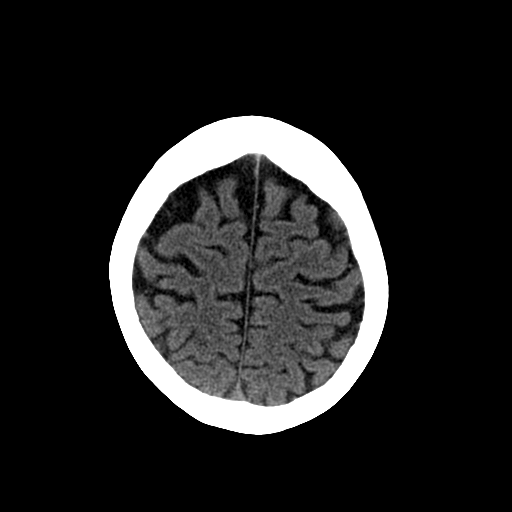
[im 25/36  bone]
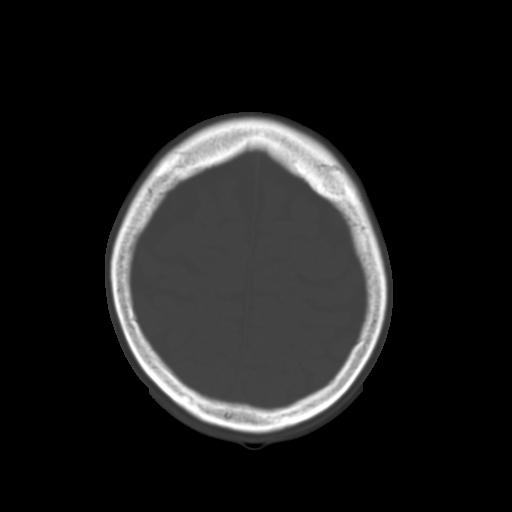
[im 28/36  brain]
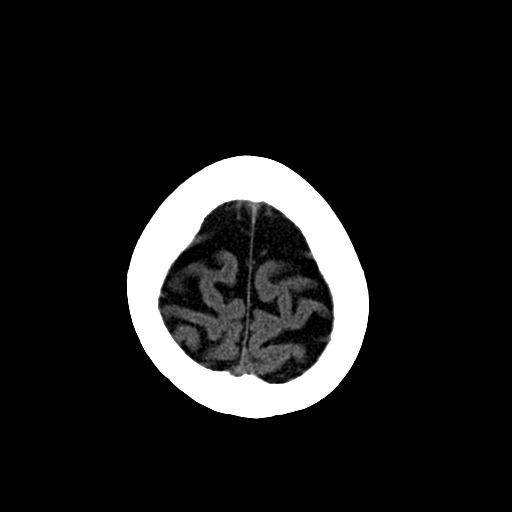
[im 31/36  brain]
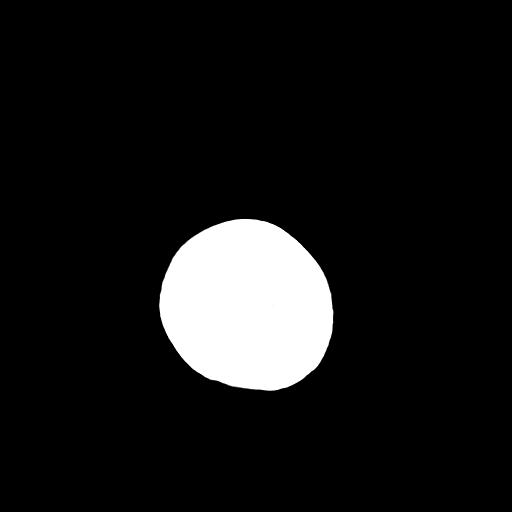
[im 33/36  brain]
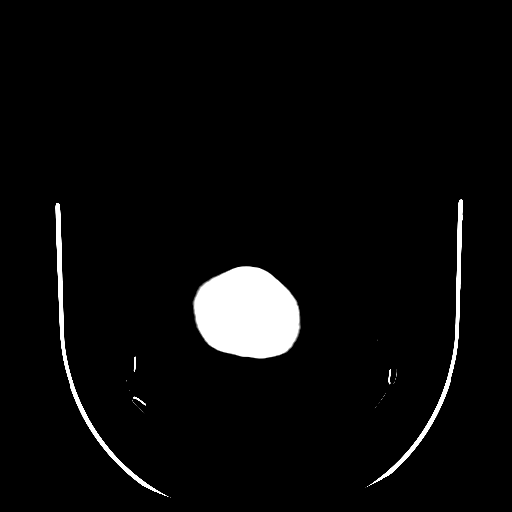

[Series 601: cor head · coronal · 0.49mm/px · 3 of 105 slices shown]
[im 21/105  brain]
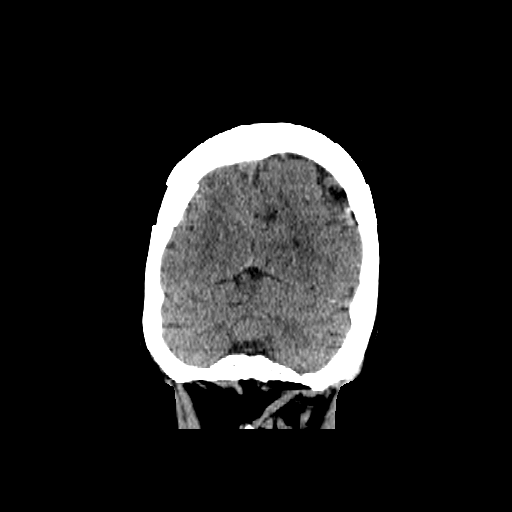
[im 42/105  brain]
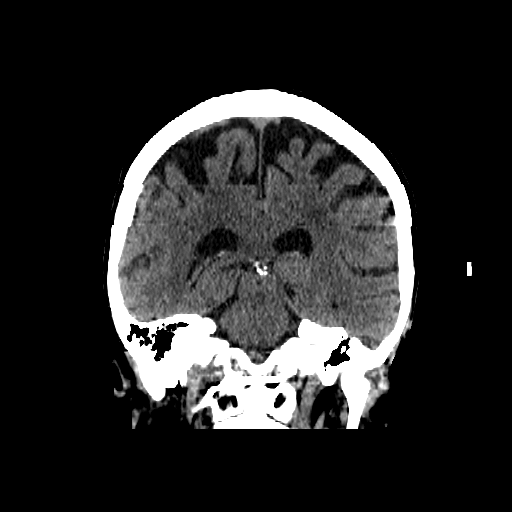
[im 63/105  brain]
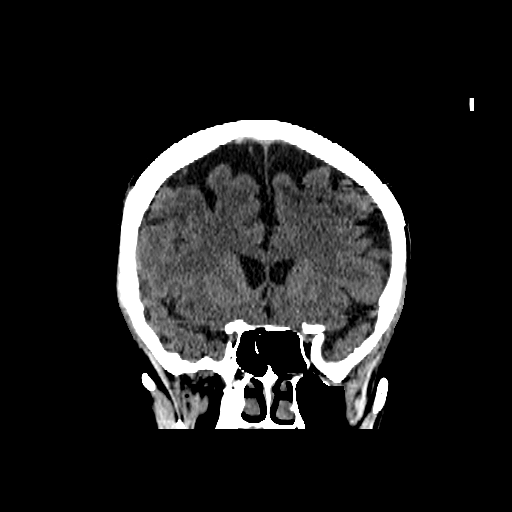

[15 of 40 positions shown; findings below may reference images not displayed]

FINDINGS: Parenchyma:Generalized parenchymal volume loss  No acute large territory infarction. No acute hemorrhage. No mass or mass effect. Patchy areas of hypoattenuation are present in the cerebral white matter that are nonspecific but compatible with mild chronic microvascular ischemic changes.
Extra-axial Collection:  None
Ventricular System:  Ex vacuo enlarged without hydrocephalus
Dural Venous Sinuses:  Unremarkable
Osseous Structures:  Unremarkable.
Included Orbits: Unremarkable
Paranasal Sinuses:  Predominantly clear
Tympanomastoid Cavities:  Unremarkable
Other:  Atherosclerotic intracranial calcifications are present.
IMPRESSION: No acute intracranial abnormality.

## 2021-11-07 IMAGING — CR XR CHEST 1 VIEW
1 series · 1 of 1 positions shown · non-contrast
Comparison: 06/01/2021

FINAL REPORT:
EXAM: XR CHEST 1 VIEW
CLINICAL INDICATION: chest pain, tightness

[AP]
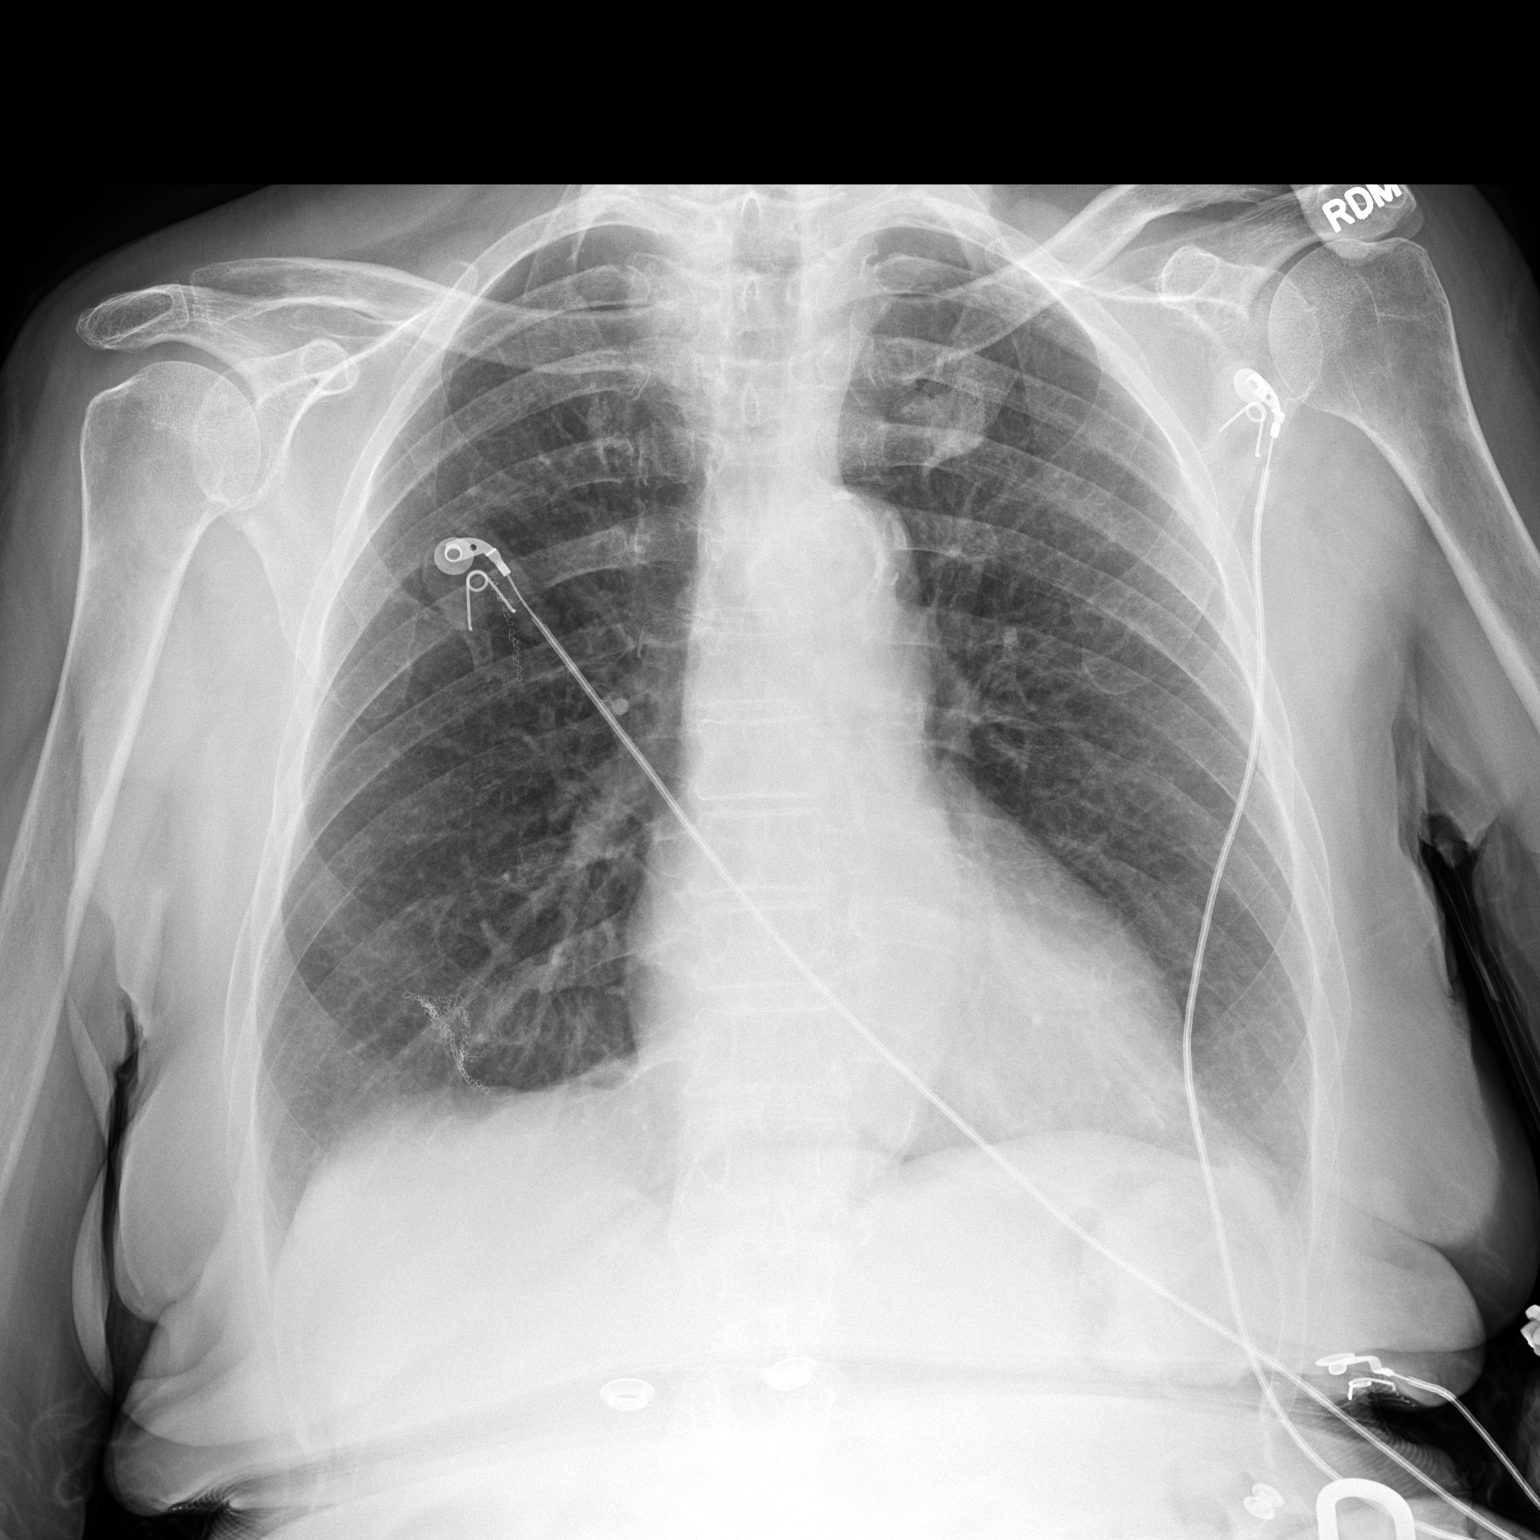

[1 of 1 positions shown; findings below may reference images not displayed]

FINDINGS: Support Devices: None
Lungs/Pleura: No focal consolidation.  No pneumothorax. No pleural effusion.
Heart/Mediastinum: Not enlarged. Calcified aortic arch.
Bones/Soft tissues: No acute abnormality.
IMPRESSION: No acute cardiopulmonary abnormality.

## 2022-04-15 IMAGING — CT CT HEAD WITHOUT CONTRAST
2 of 3 series · 15 of 40 positions shown, 18 images · non-contrast
Comparison: 11/07/2021.

Headache, dizzy, fell and hit her head and passed out
No hx of cancer or surgery
Hx of CVA
FINAL REPORT:
EXAM: CT Head without contrast.
INDICATION: Headache, new or worsening (Age >= 50y). . .
TECHNIQUE: Contiguous axial images were obtained through the head without intravenous contrast. All CT scans at this facility use dose modulation and/or weight based dosing when appropriate to reduce radiation dose to as low as reasonably achievable.

[Series 2: head stnd · axial · 0.49mm/px · z∈[+20,+172]mm · 12 of 36 slices shown, 15 images]
[im 3/36  brain]
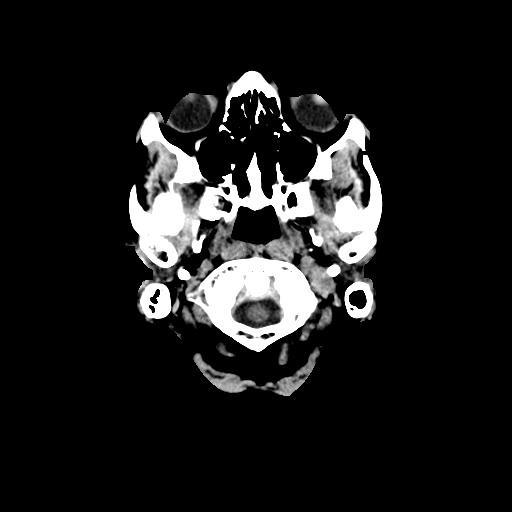
[im 3/36  bone]
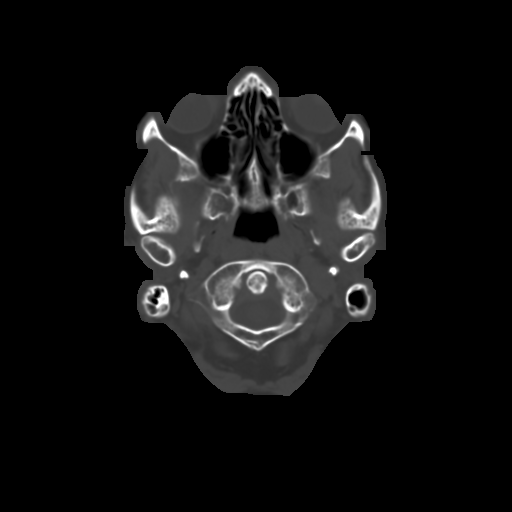
[im 5/36  brain]
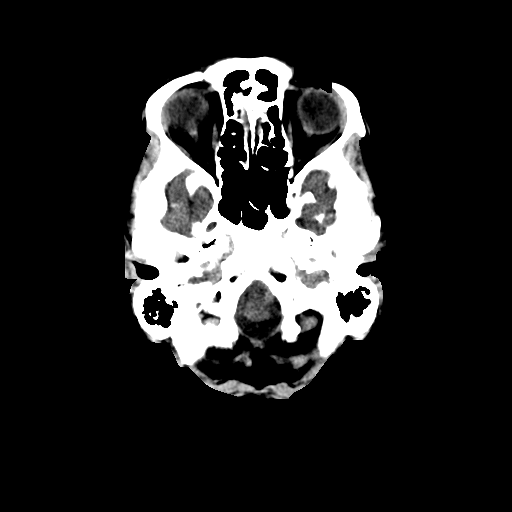
[im 8/36  brain]
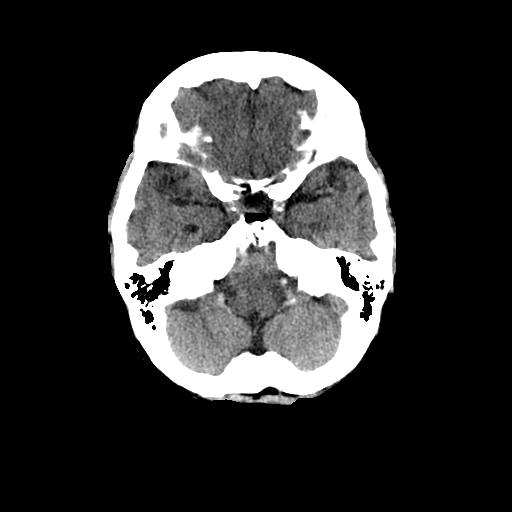
[im 11/36  brain]
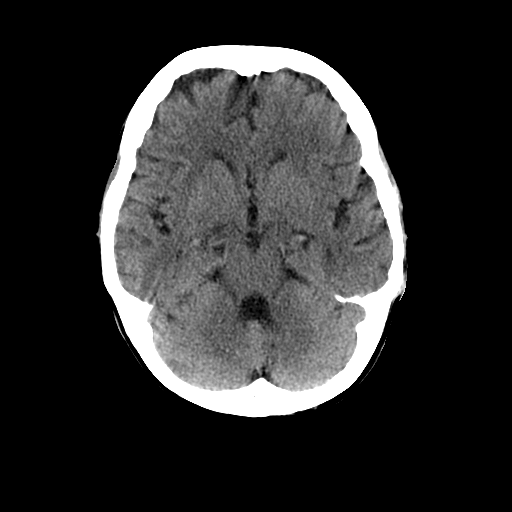
[im 14/36  brain]
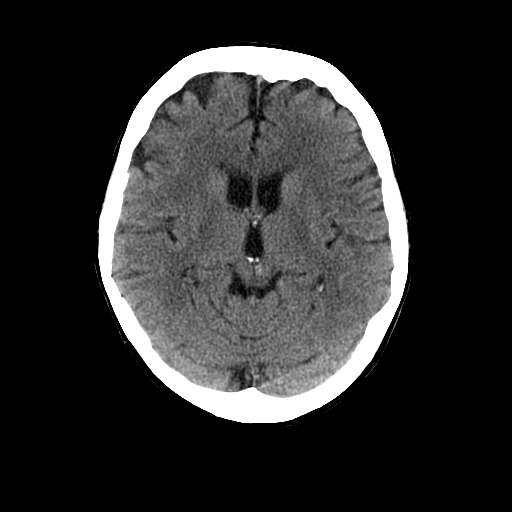
[im 14/36  bone]
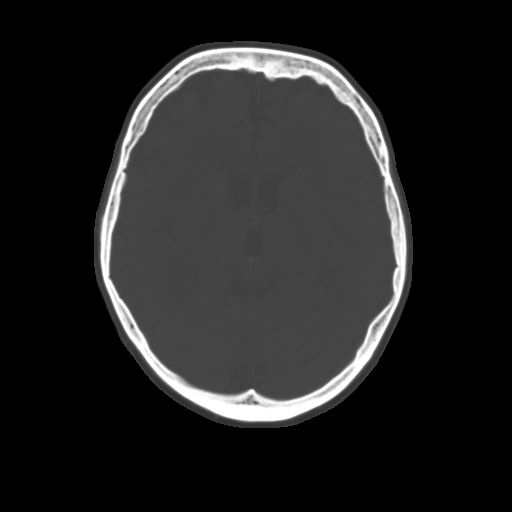
[im 16/36  brain]
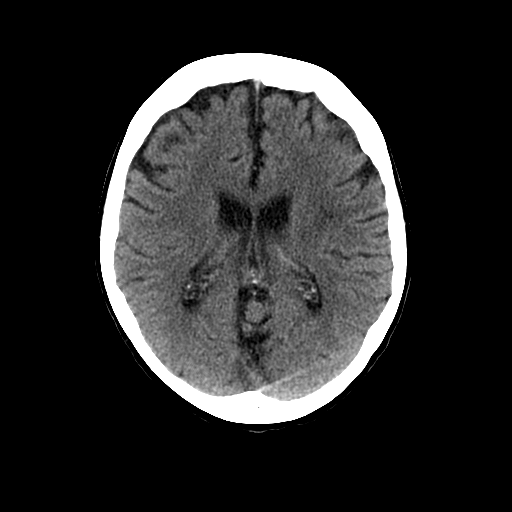
[im 20/36  brain]
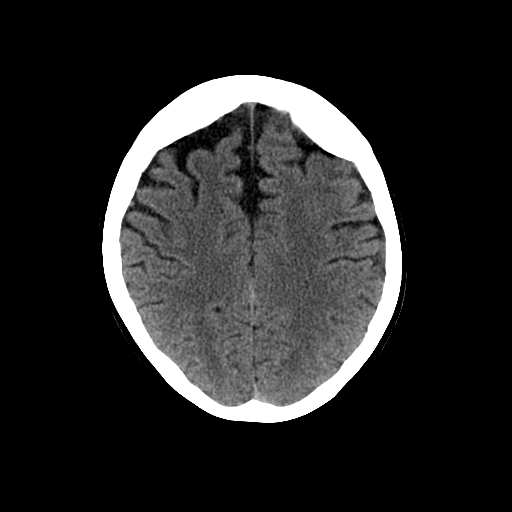
[im 22/36  brain]
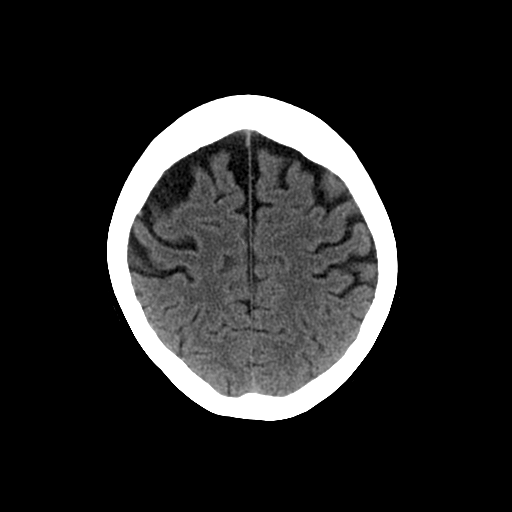
[im 25/36  brain]
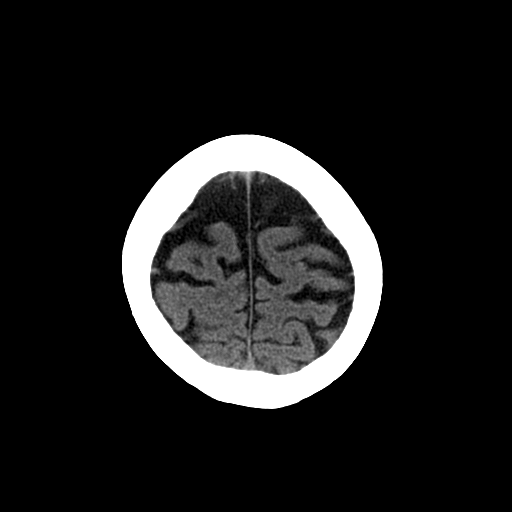
[im 25/36  bone]
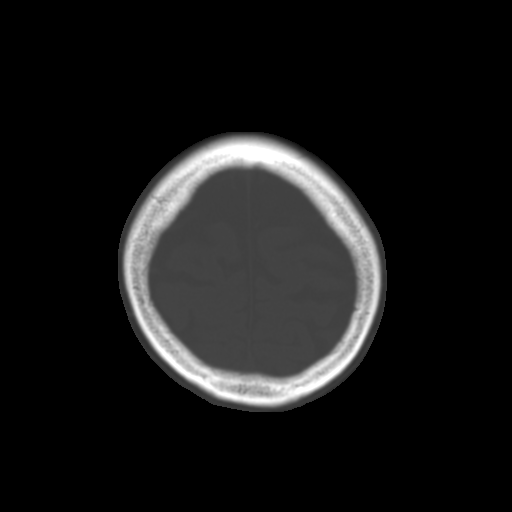
[im 28/36  brain]
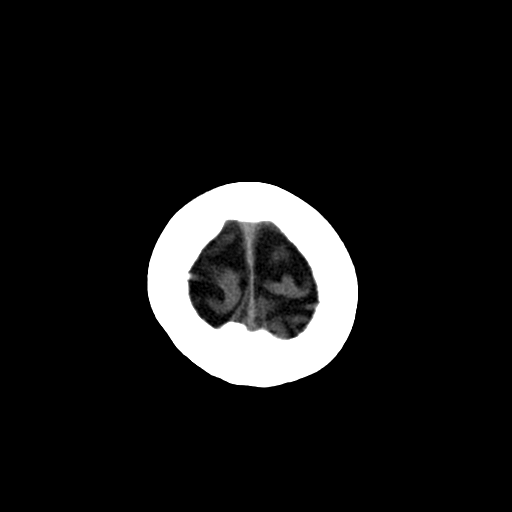
[im 31/36  brain]
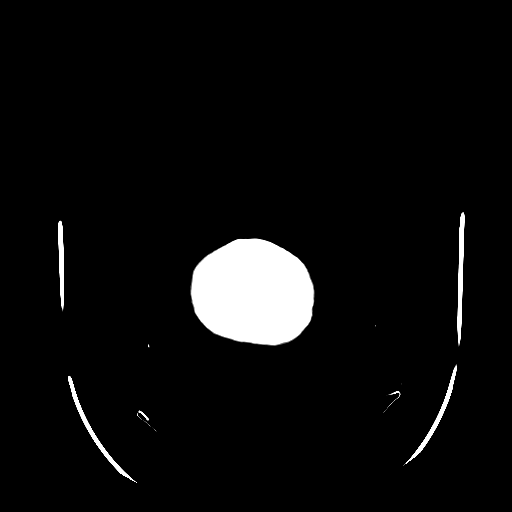
[im 33/36  brain]
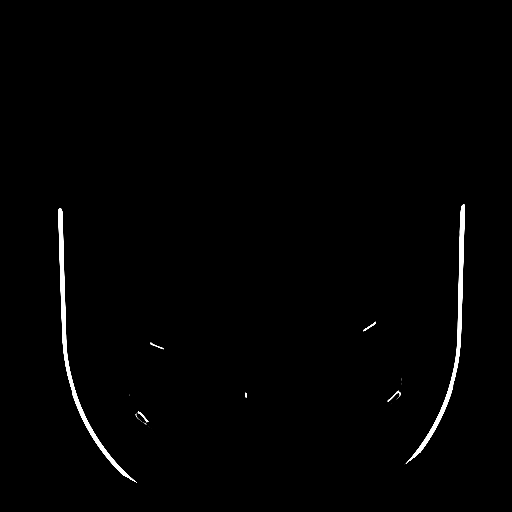

[Series 601: cor head · coronal · 0.49mm/px · 3 of 108 slices shown]
[im 22/108  brain]
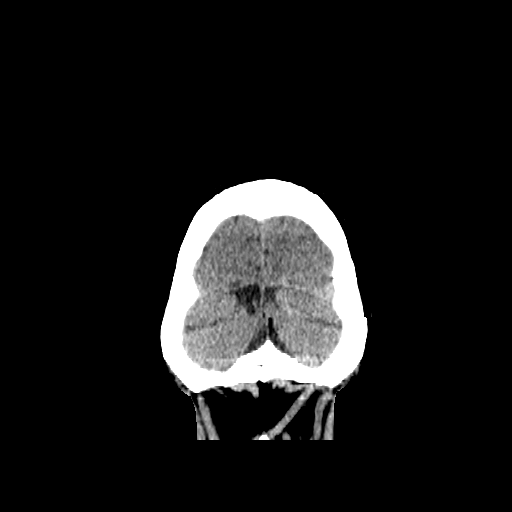
[im 43/108  brain]
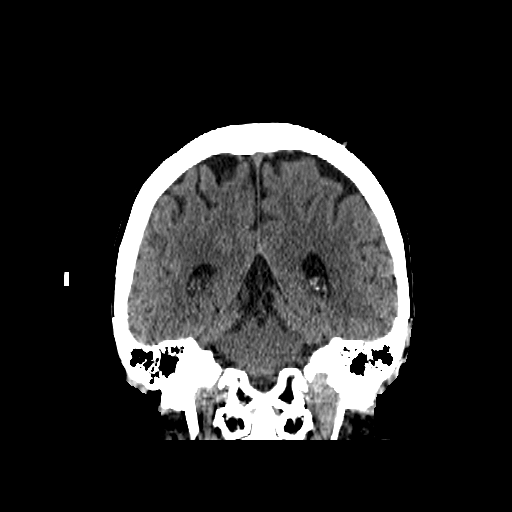
[im 65/108  brain]
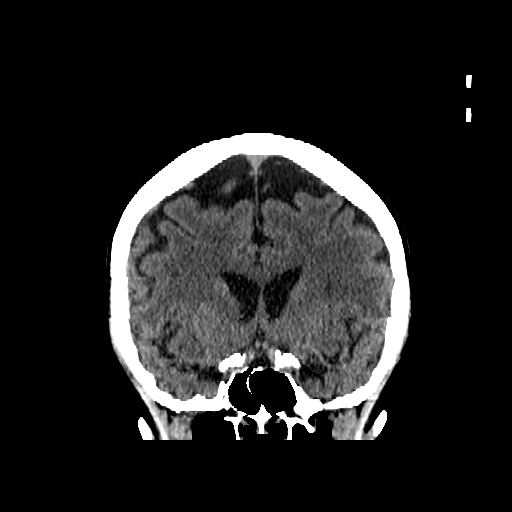

[15 of 40 positions shown; findings below may reference images not displayed]

FINDINGS: Mild limitations from motion artifact.
Basal cisterns are patent. No midline shift. Mild generalized cerebral parenchymal loss. No hydrocephalus.
No acute intracranial hemorrhage or large vessel based transcortical infarction.  No extra-axial fluid collections.
No fractures.  Sinuses and mastoids are clear.
IMPRESSION: No acute intracranial abnormalities.

## 2022-04-15 IMAGING — CR XR CHEST 1 VIEW
1 series · 1 of 1 positions shown · non-contrast
Comparison: 11/07/2021

FINAL REPORT:
EXAM: XR CHEST 1 VIEW
INDICATION: accidental fall
TECHNIQUE: Radiograph of the chest, 1 view.

[AP]
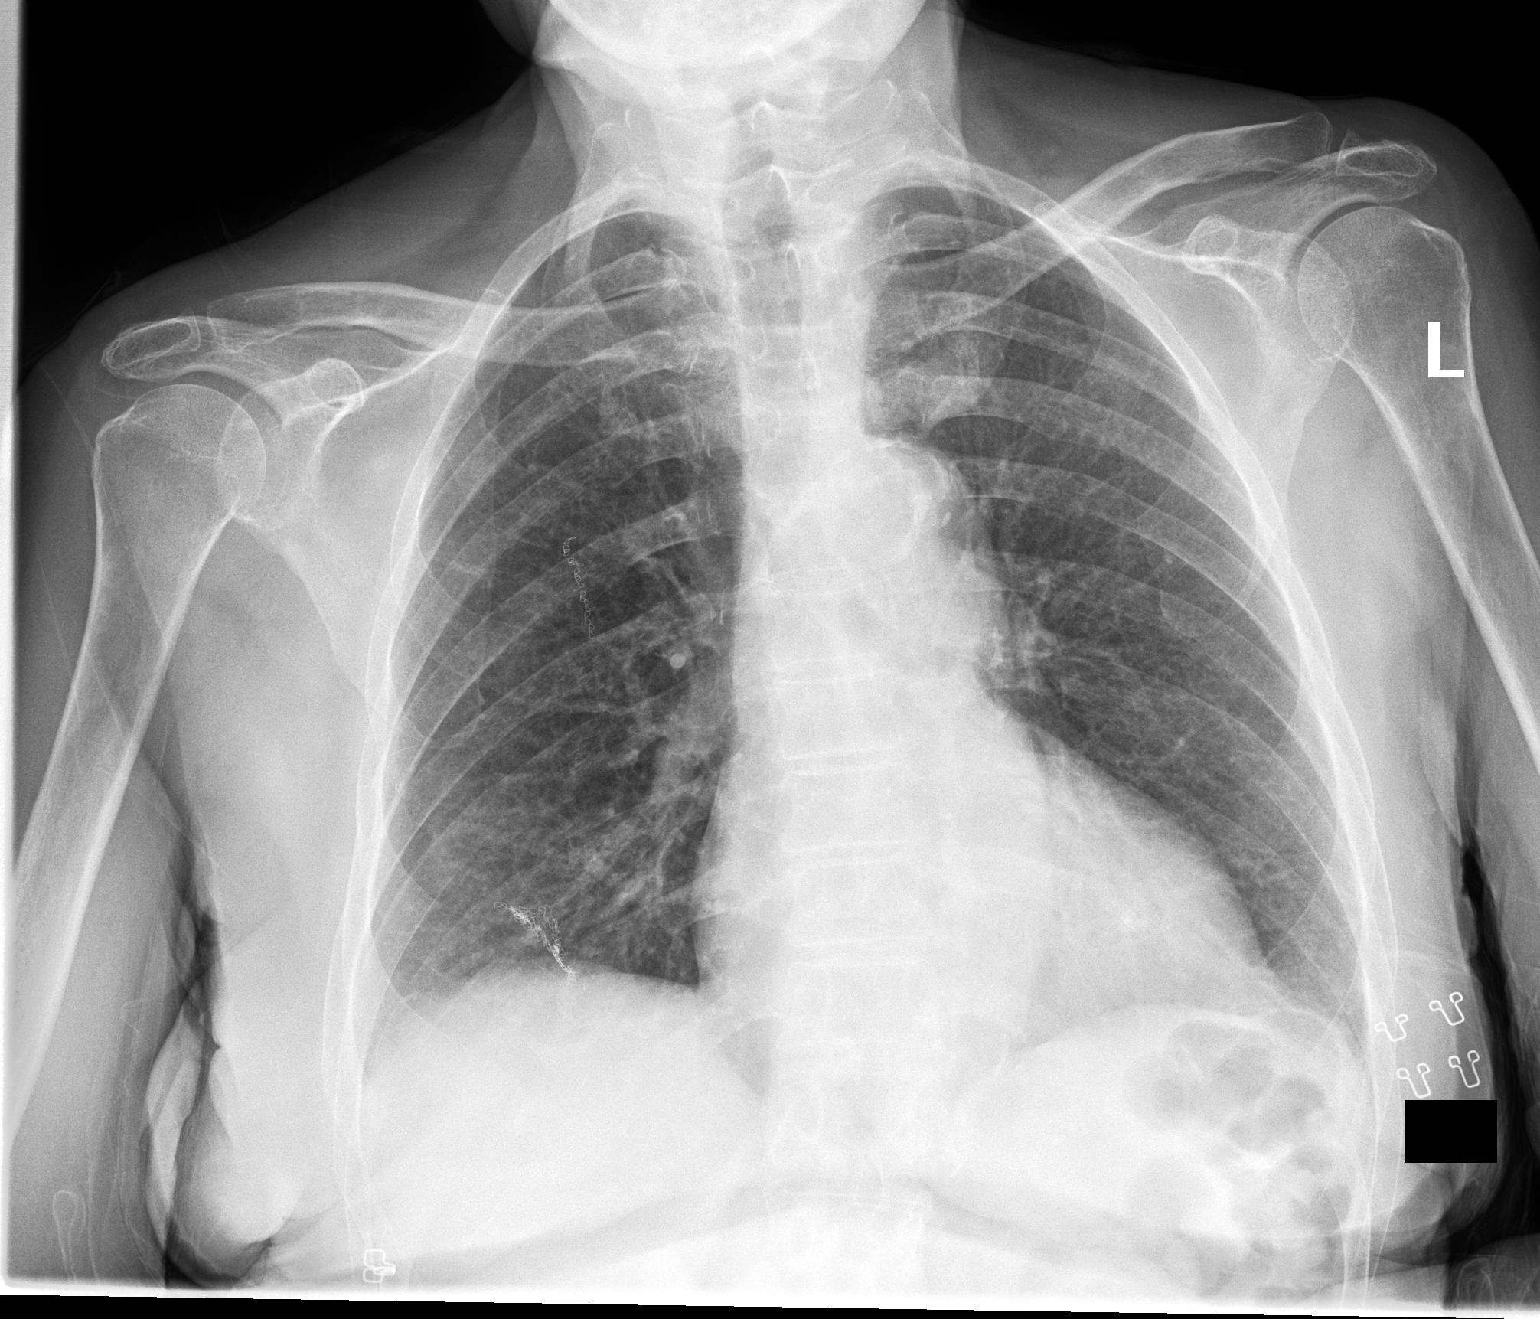

[1 of 1 positions shown; findings below may reference images not displayed]

FINDINGS: Support Devices: None.
Cardiomediastinal contours: Unchanged.
Lungs: Postsurgical changes in the right lower lung zone. Mild bibasilar atelectasis. No focal areas of effusion or consolidation. No pneumothorax.
Bones: No acute osseous abnormalities.
IMPRESSION: No acute cardiopulmonary process.

## 2022-06-01 IMAGING — MG MAMMO BREAST SCREENING BILATERAL
9 of 12 series · 9 of 28 positions shown · non-contrast
Comparison: 04/23/2021
Breast density: Scattered fibroglandular densities (B)

This is a summary report. The complete report is available in the patient's medical record. If you cannot access the medical record, please contact the sending organization for a detailed fax or copy.
FINAL REPORT:
MAMMO BREAST SCREENING TOMOSYNTHESIS 3D BILATERAL
INDICATION: 71-year-old female who reports a family member with unspecified cancer history at age 54.
TECHNIQUE: Bilateral digital CC and MLO images of the breasts were obtained with tomosynthesis. CAD was utilized.

[L CC]
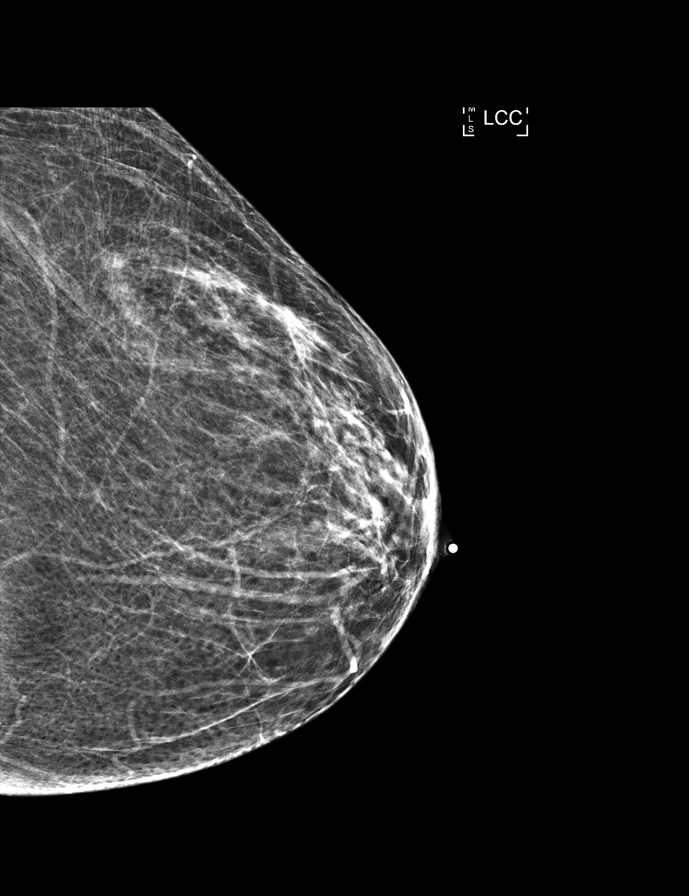

[R MLO]
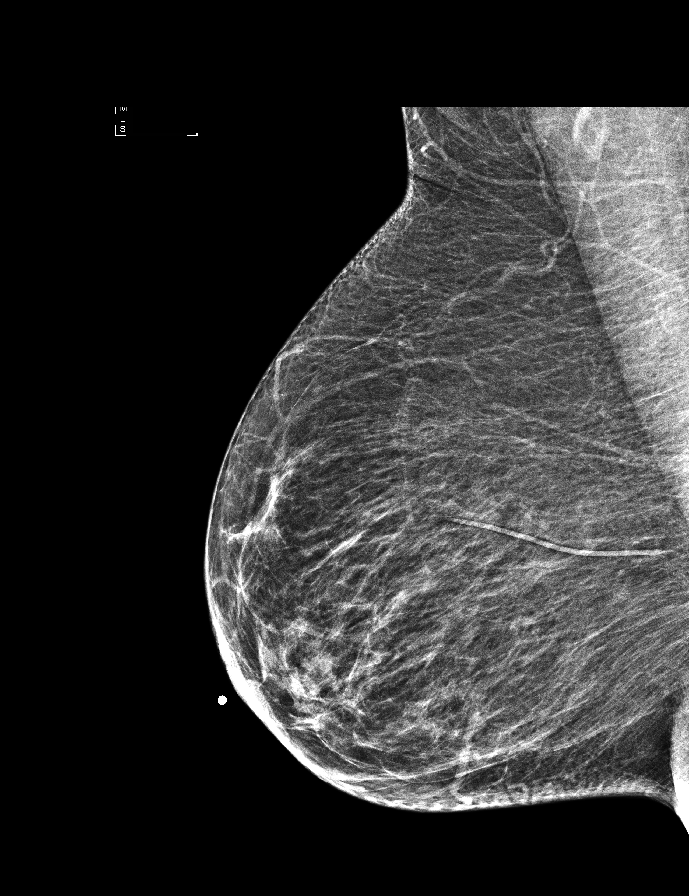

[R MLO synth-2D]
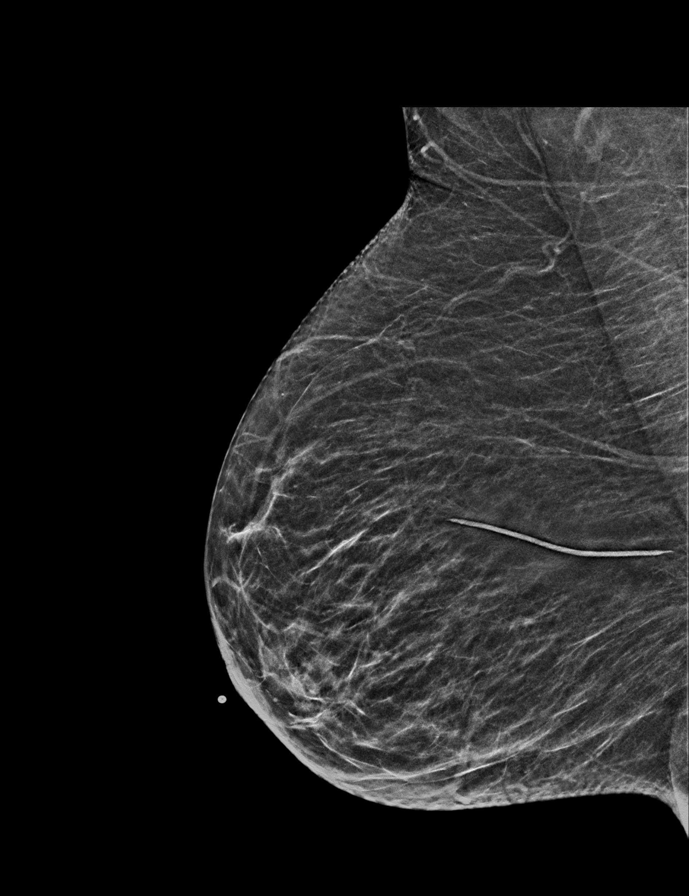

[R CC]
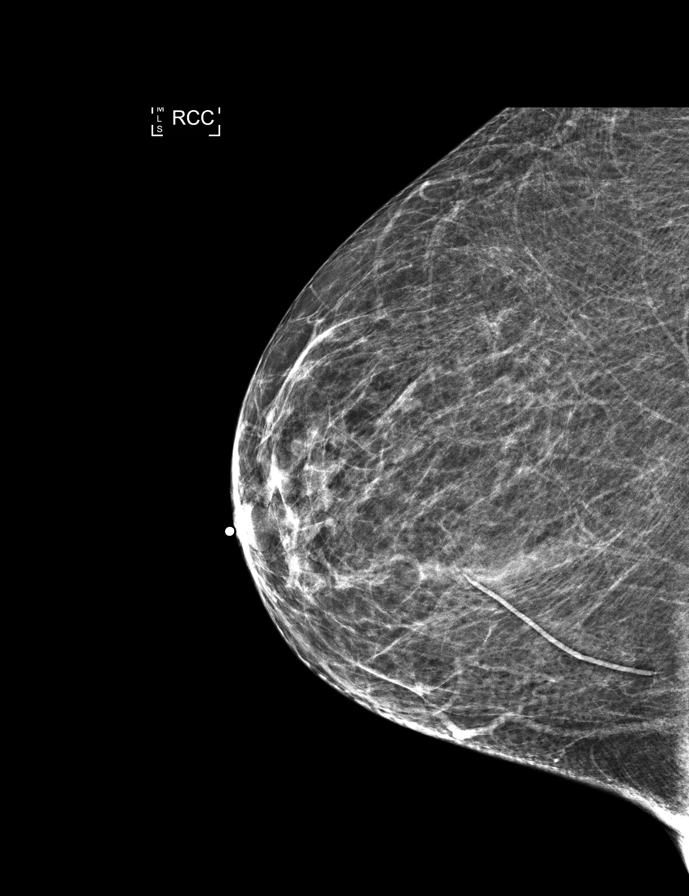

[L MLO synth-2D]
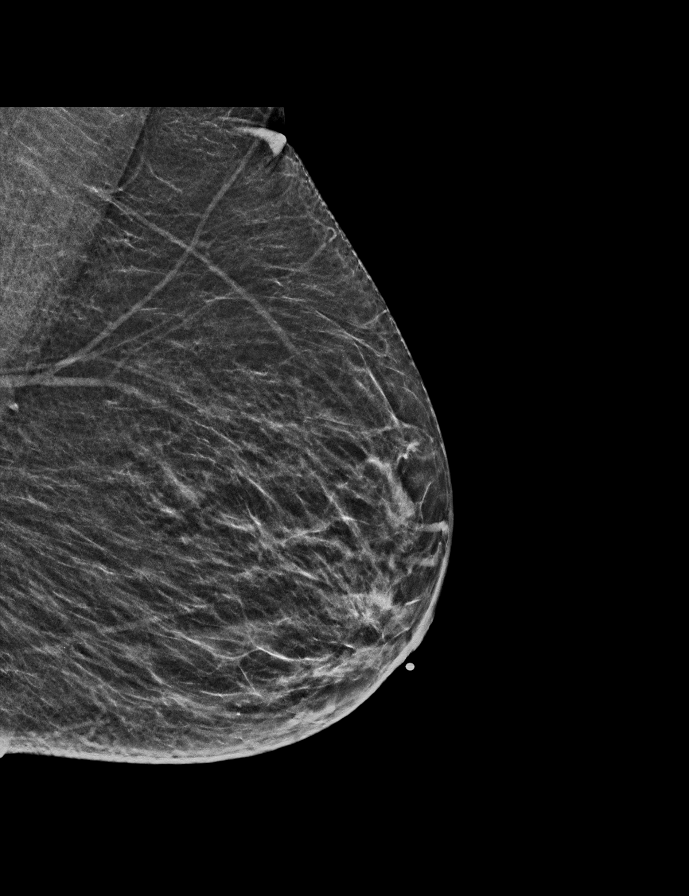

[R CC synth-2D]
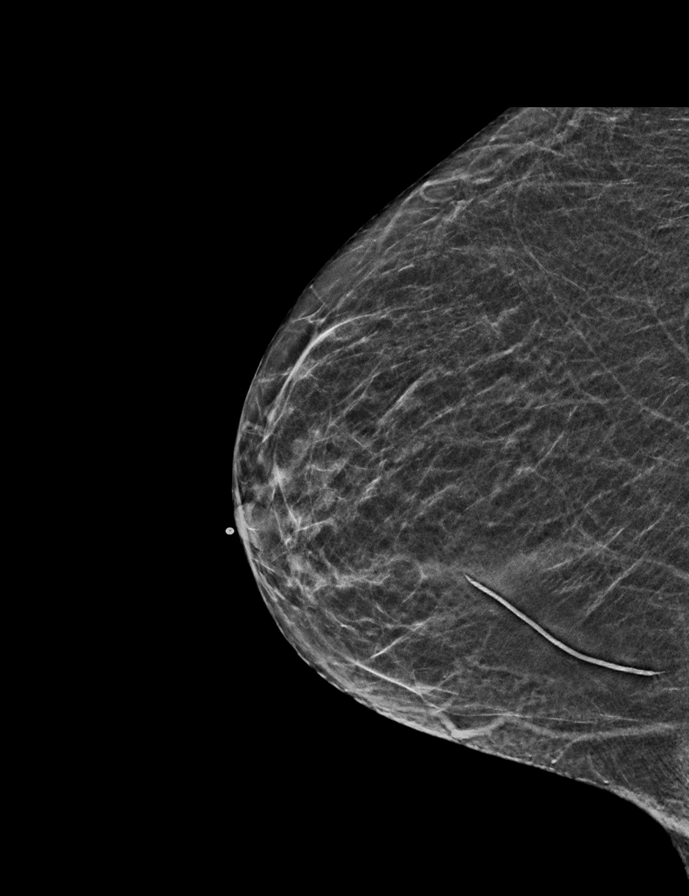

[L CC synth-2D]
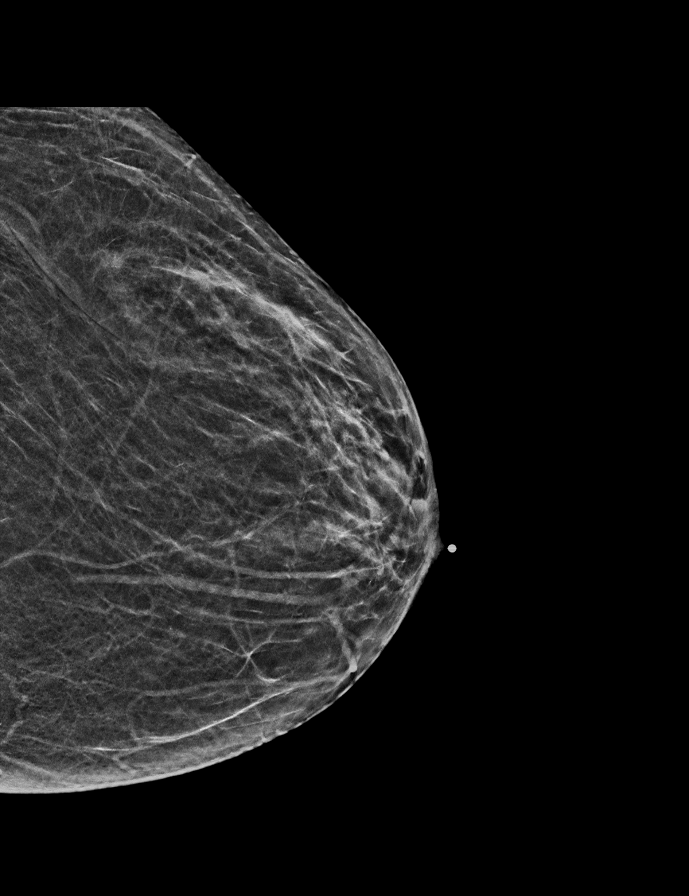

[L MLO]
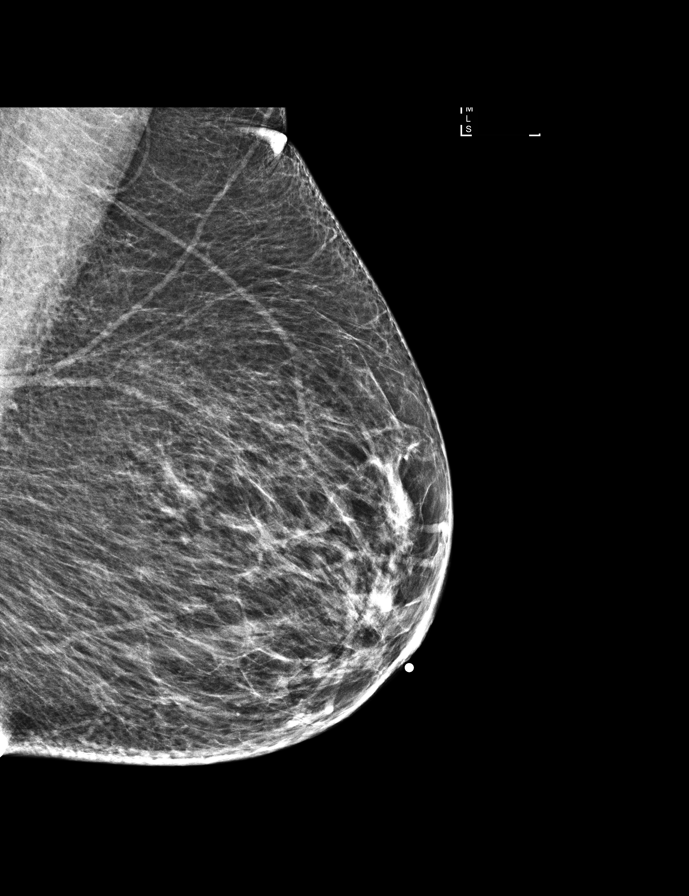

[L MLO tomo · tomo slice 21/42.0]
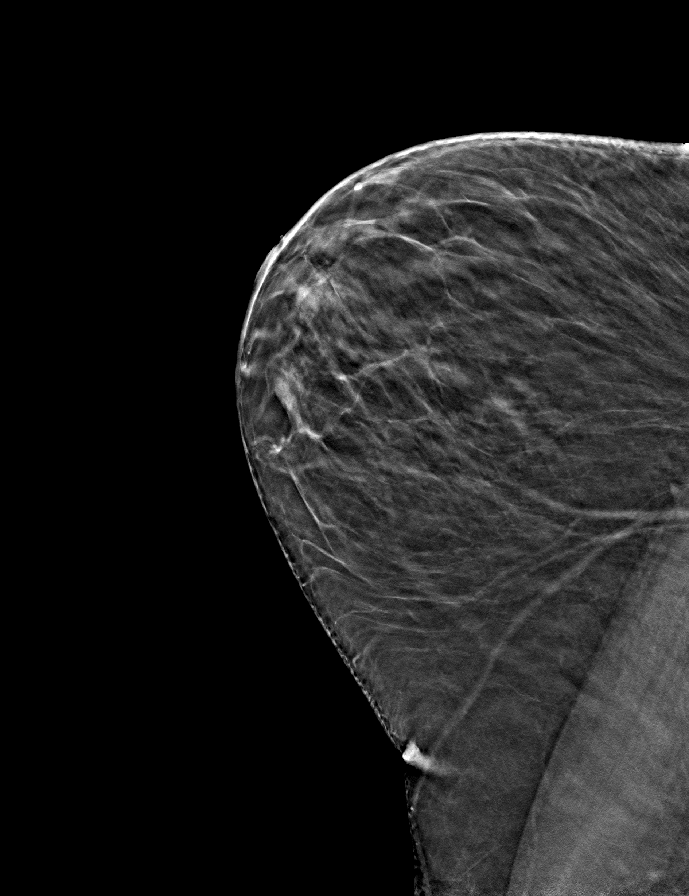

[9 of 28 positions shown; findings below may reference images not displayed]

FINDINGS: There is a scar marker overlying the right posterior breast near the [DATE] axis. There is a punctate benign calcification in the right breast. No suspicious masses or calcifications. No skin thickening or nipple retraction.
IMPRESSION: 
IMPRESSION: No mammographic evidence of malignancy.
Recommendations: Annual screening.
BI-RADS code: 2; benign findings
**One must recognize that there is as high as a 10-15% false-negative rate on a single screening mammogram. Current recommendations are for annual screening mammography from age 40 onwards.**
The patient will be entered into a reminder system with a target due date for the next mammogram.

## 2022-06-22 IMAGING — CR XR CHEST 1 VIEW
1 series · 1 of 1 positions shown · non-contrast
Comparison: 04/15/2022
There is no acute parenchymal consolidation.

FINAL REPORT:
Chest AP portable:
CLINICAL INDICATION: Chest pain

[AP]
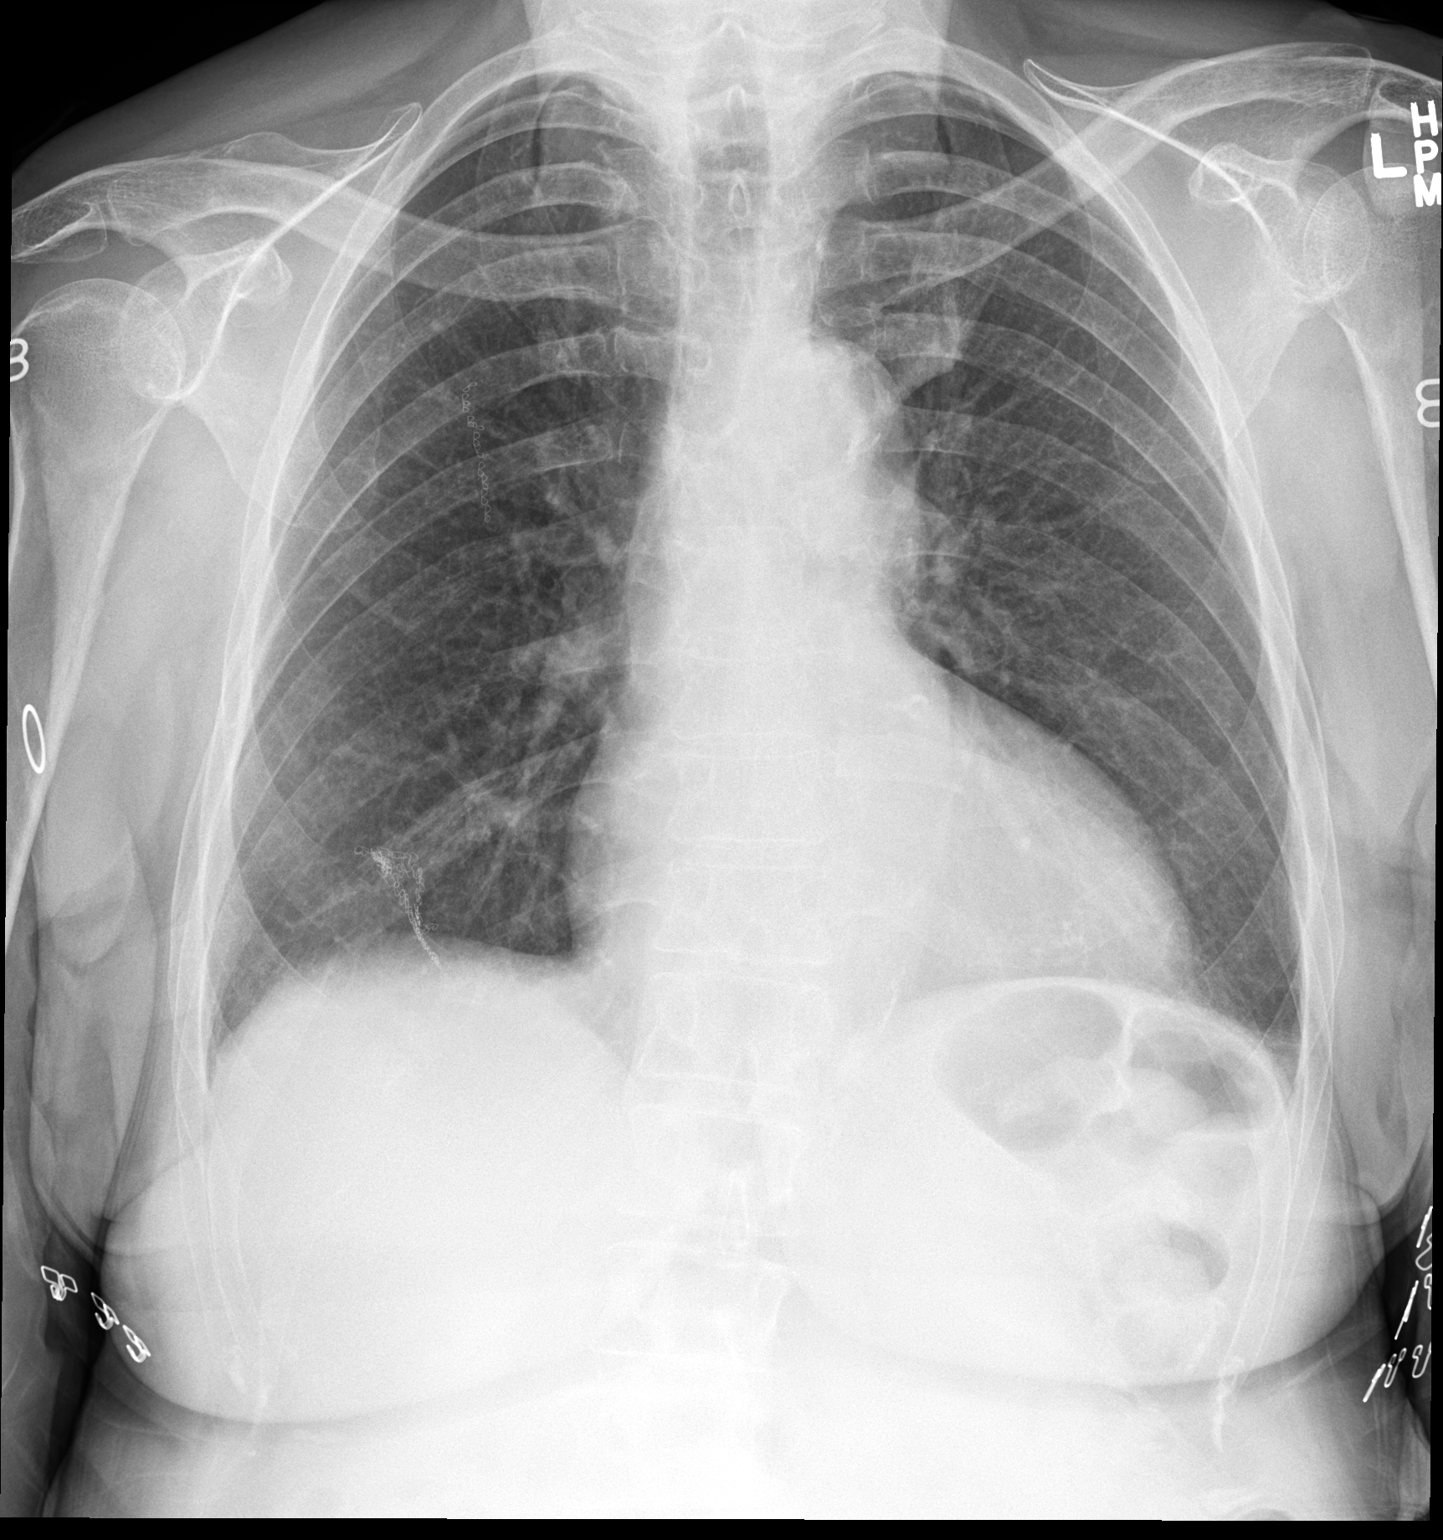

[1 of 1 positions shown; findings below may reference images not displayed]

No vascular congestion, pneumothorax or gross effusion is seen. Heart appears of normal size. The skeletal structures appear intact.
IMPRESSION: No acute intrathoracic process. No significant change.

## 2022-07-22 IMAGING — CT CT HEAD WITHOUT CONTRAST
2 of 3 series · 15 of 40 positions shown, 18 images · non-contrast
Comparison: CT of the head 04/15/2022.

FINAL REPORT:
INDICATION: Head trauma, moderate-severe
fall
Exam: CT of the head without IV Contrast
Submitted images: [id number]
TECHNIQUE: Noncontrast CT images were obtained from the skull base to vertex. All CT scans at this facility use dose modulation and/or weight based dosing when appropriate to reduce radiation dose to as low as reasonably achievable

[Series 2: head stnd · axial · 0.47mm/px · z∈[-8,+147]mm · 12 of 36 slices shown, 15 images]
[im 3/36  brain]
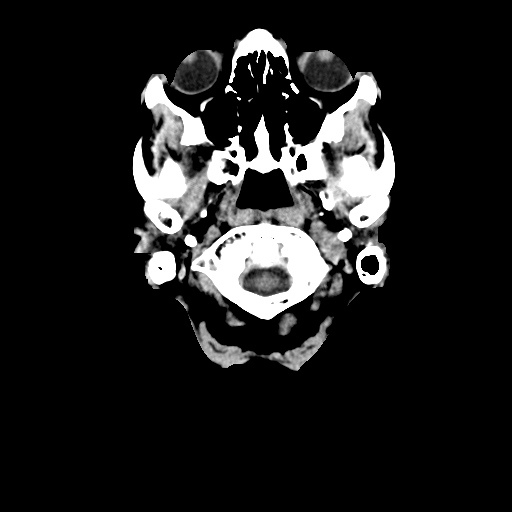
[im 3/36  bone]
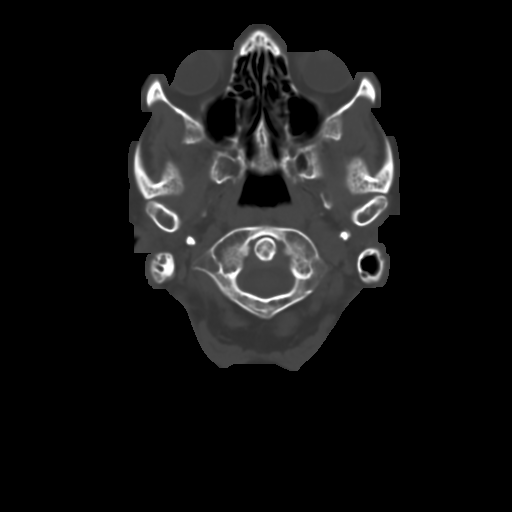
[im 5/36  brain]
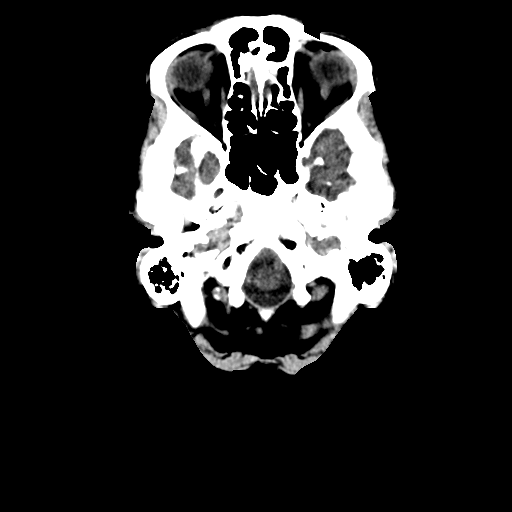
[im 8/36  brain]
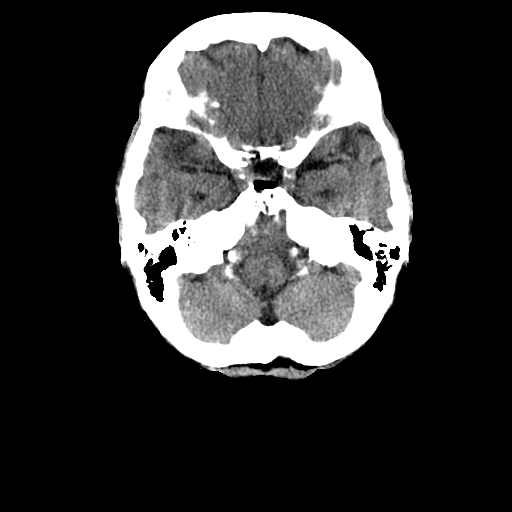
[im 11/36  brain]
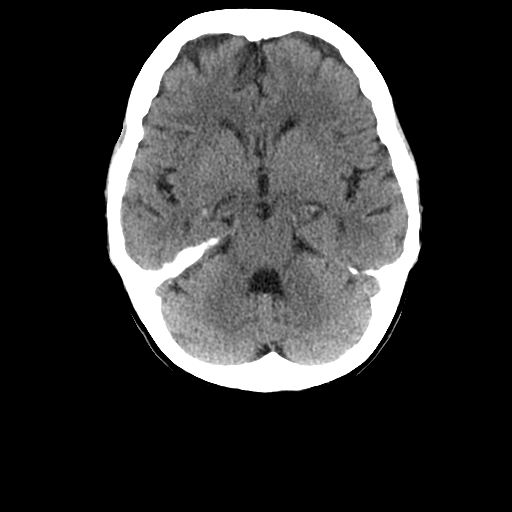
[im 14/36  brain]
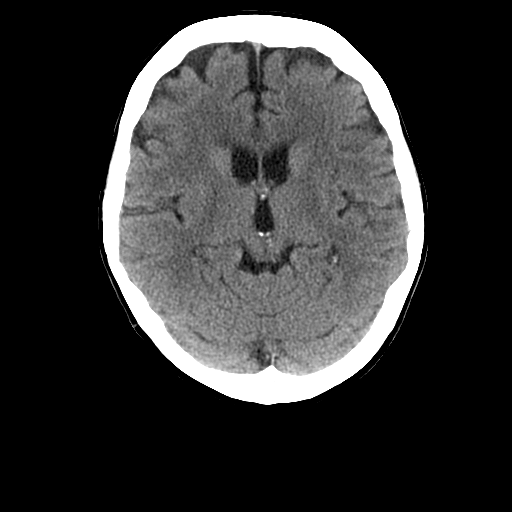
[im 14/36  bone]
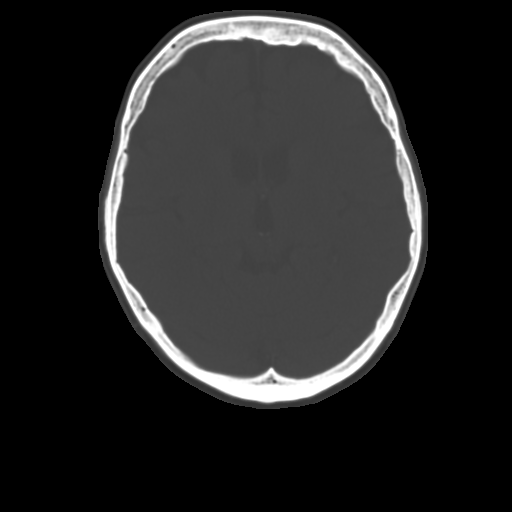
[im 16/36  brain]
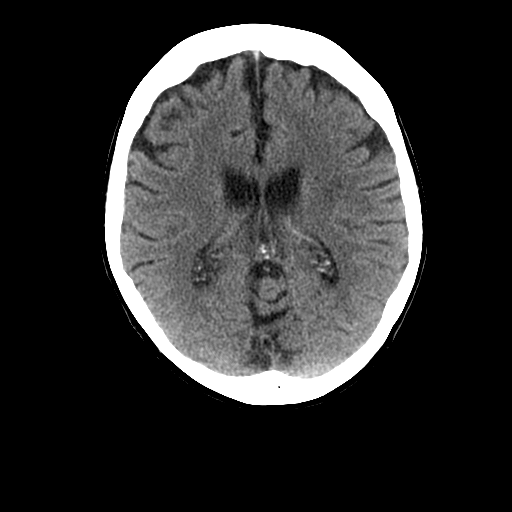
[im 20/36  brain]
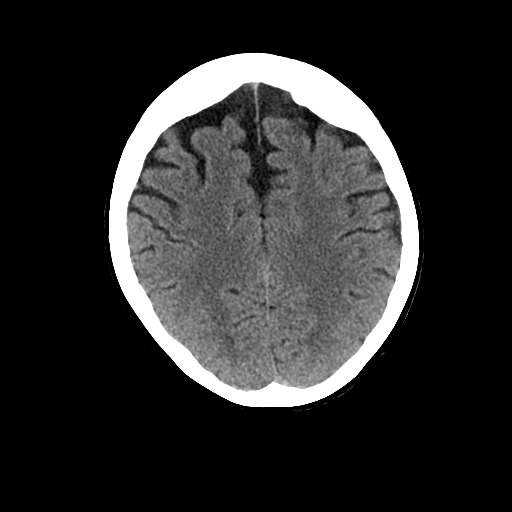
[im 22/36  brain]
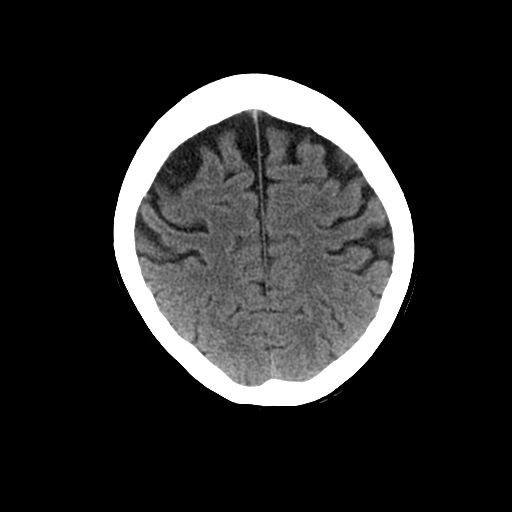
[im 25/36  brain]
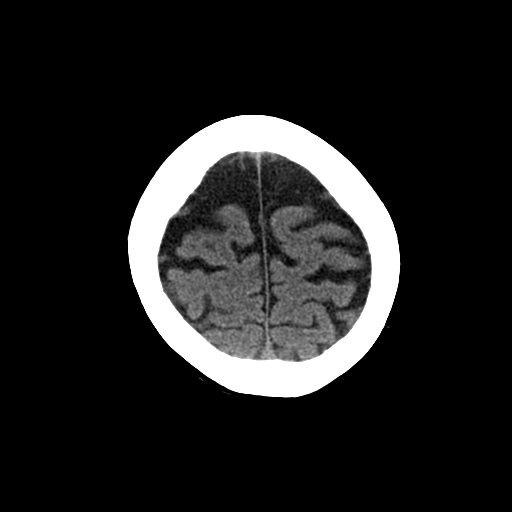
[im 25/36  bone]
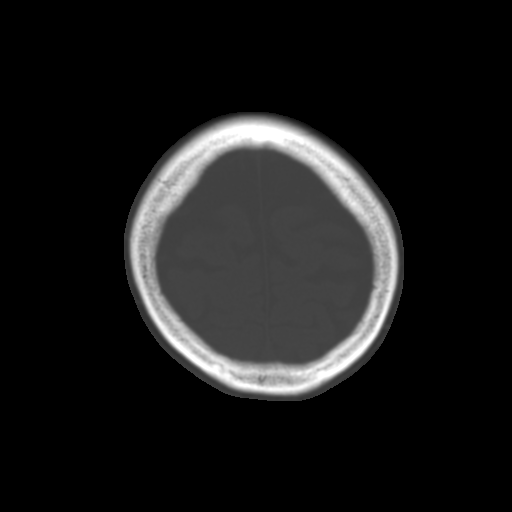
[im 28/36  brain]
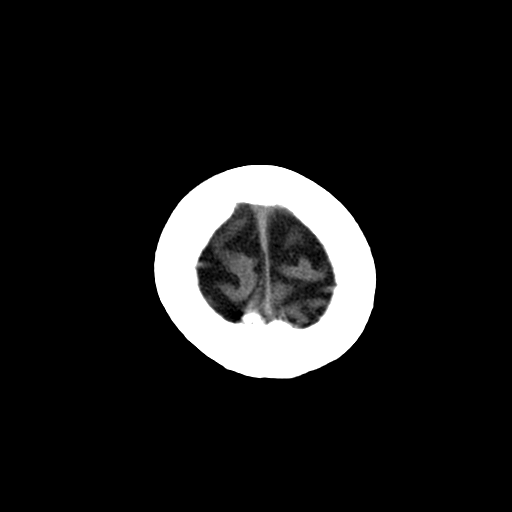
[im 31/36  brain]
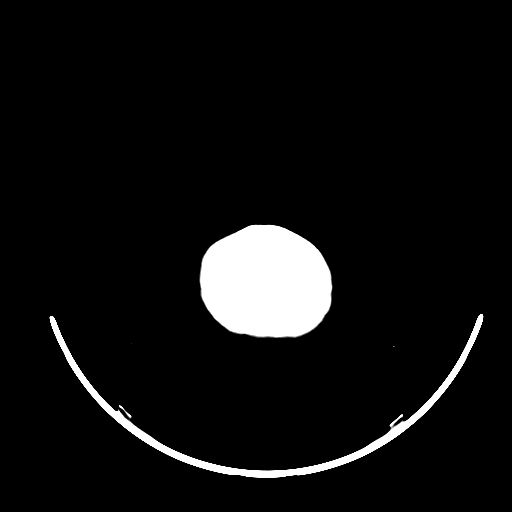
[im 33/36  brain]
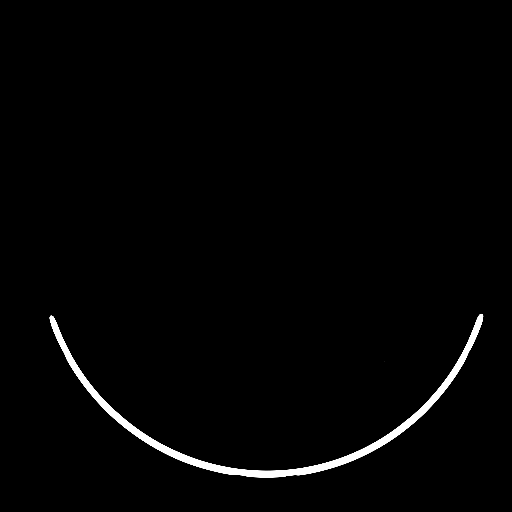

[Series 601: cor head · coronal · 0.47mm/px · 3 of 103 slices shown]
[im 29/103  brain]
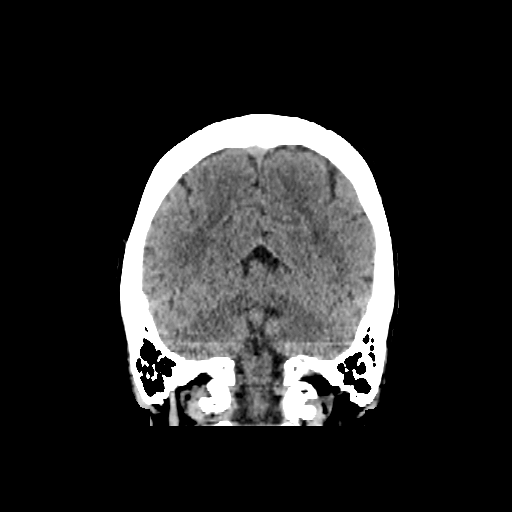
[im 41/103  brain]
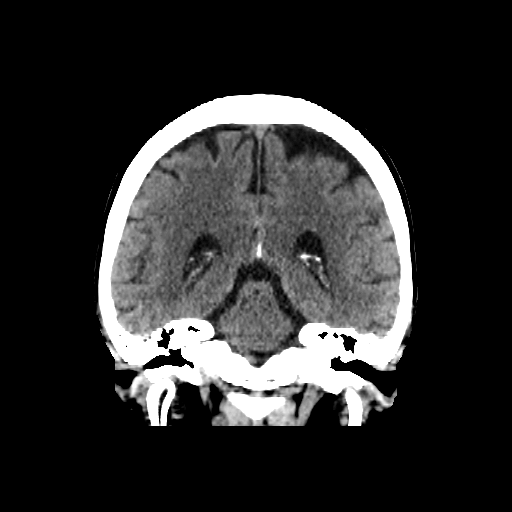
[im 54/103  brain]
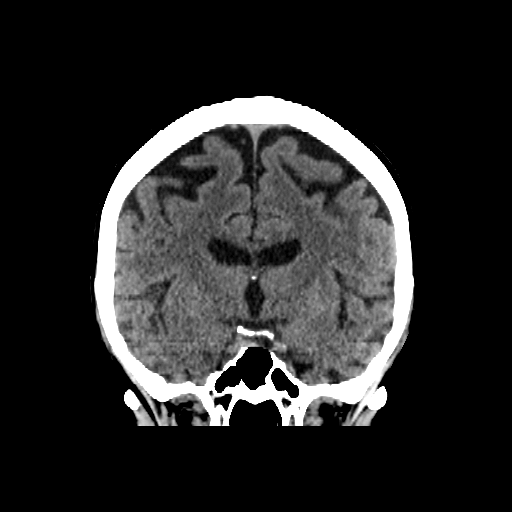

[15 of 40 positions shown; findings below may reference images not displayed]

FINDINGS: Extra-axial spaces: Unremarkable
Intracranial hemorrhage: None.
Ventricular system: The ventricles are not significantly dilated or effaced.
Basal cisterns: Patent
Cerebral parenchyma: There is moderate generalized cerebral volume loss, a bifrontal predominance, unchanged. No discrete loss of gray-white differentiation is identified.
Midline shift: None.
Vascular system: Mild calcified atherosclerotic plaque is noted in the cavernous ICAs.
Paranasal sinuses and mastoid air cells: Clear.
Visualized orbits: Unremarkable.
Sella: Partially empty..
Calvarium: Normal.
ASPECT score [DATE].
IMPRESSION: 
IMPRESSION: No acute intracranial abnormality is identified.

## 2022-07-22 IMAGING — CT CT CERVICAL SPINE WITHOUT CONTRAST
2 of 3 series · 13 of 20 positions shown, 16 images · non-contrast
Comparison: CT of the head 07/22/2022 and CT of the cervical spine 05/04/2021.

FINAL REPORT:
INDICATION: Neck trauma (Age >= 65y)
fall
Exam: CT of the cervical spine without IV contrast
Submitted images: 270
TECHNIQUE: Noncontrast CT images were obtained of the cervical spine.All CT scans at this facility use dose modulation and/or weight based dosing when appropriate to reduce radiation dose to as low as reasonably achievable

[Series 5: c-spine stnd · axial · 0.32mm/px · z∈[-172,-4]mm · 12 of 81 slices shown, 15 images]
[im 7/81  soft-tissue]
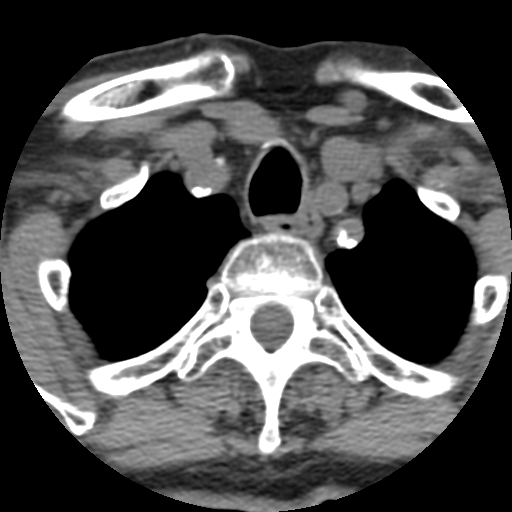
[im 7/81  bone]
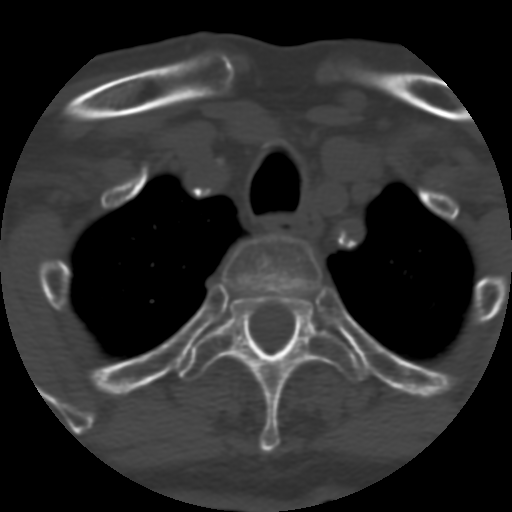
[im 13/81  bone]
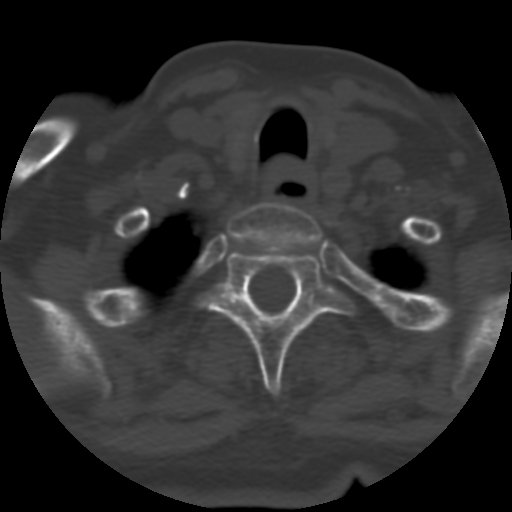
[im 19/81  bone]
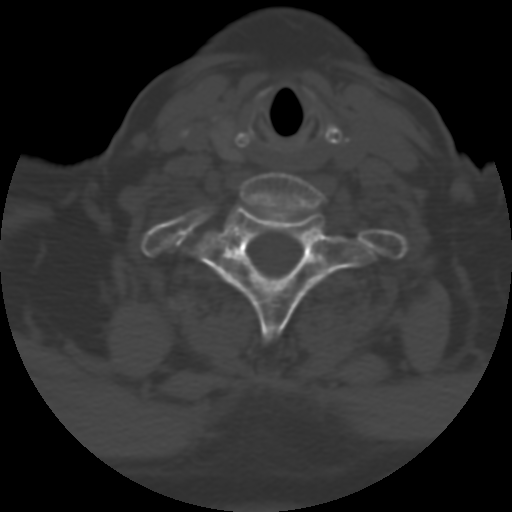
[im 25/81  bone]
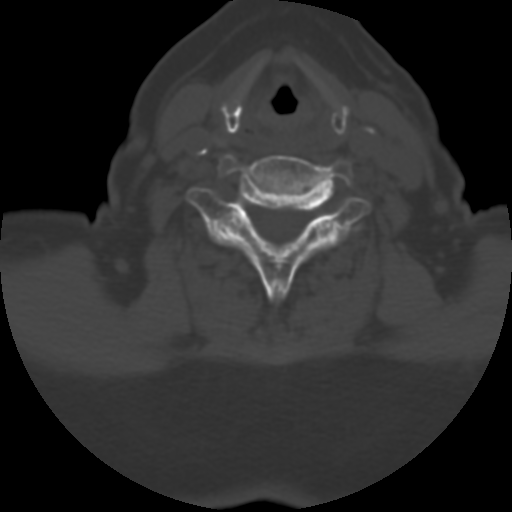
[im 31/81  soft-tissue]
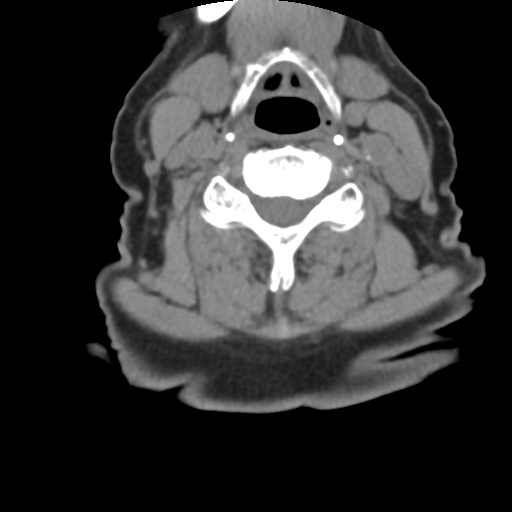
[im 31/81  bone]
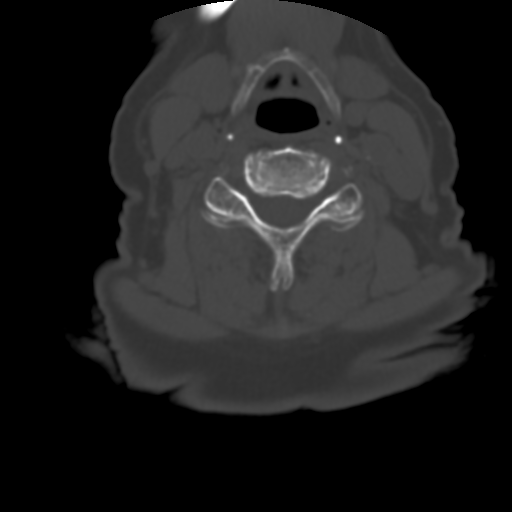
[im 37/81  bone]
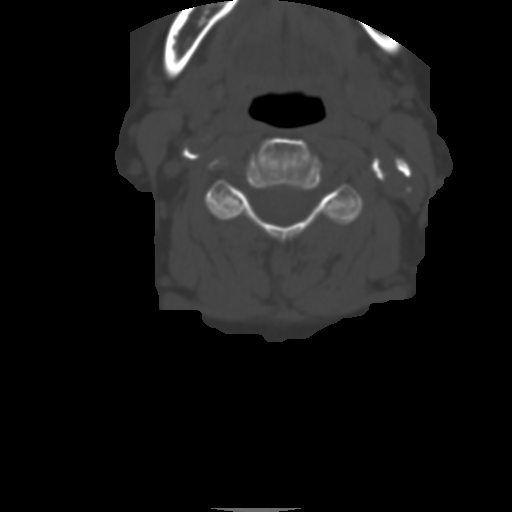
[im 44/81  bone]
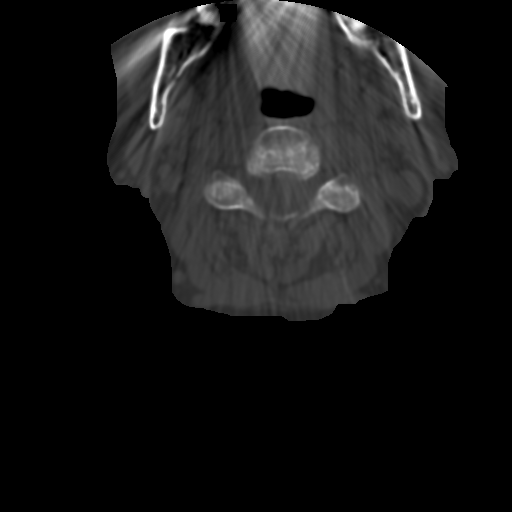
[im 50/81  bone]
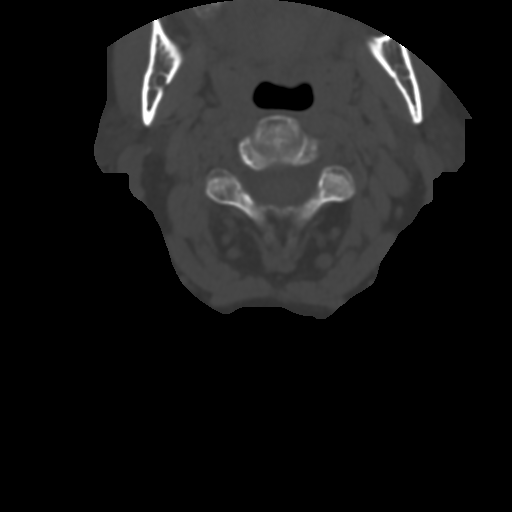
[im 56/81  soft-tissue]
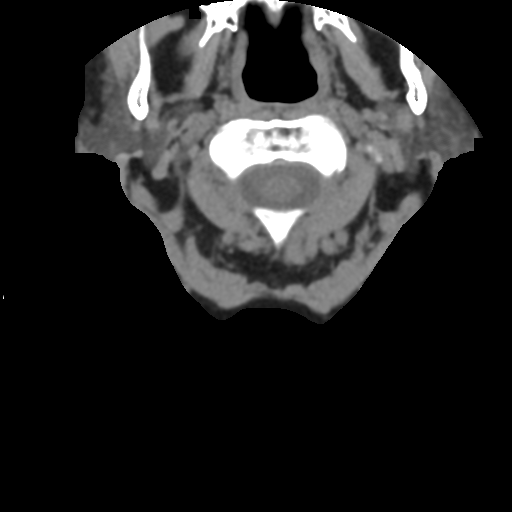
[im 56/81  bone]
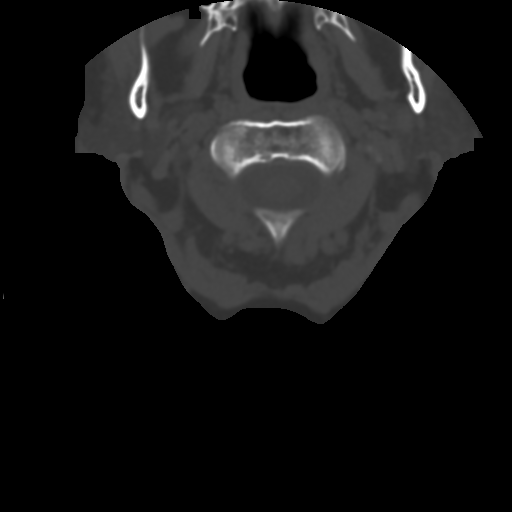
[im 62/81  bone]
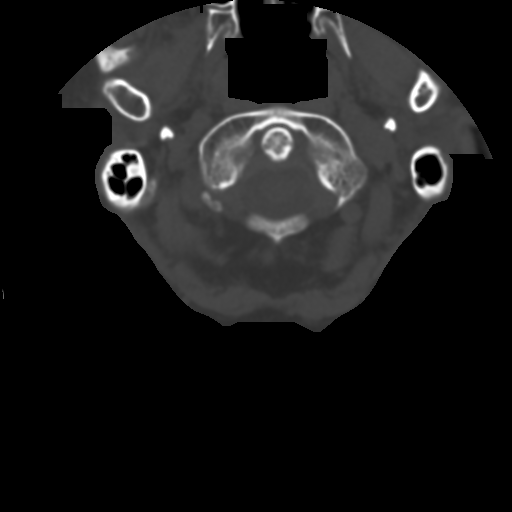
[im 68/81  bone]
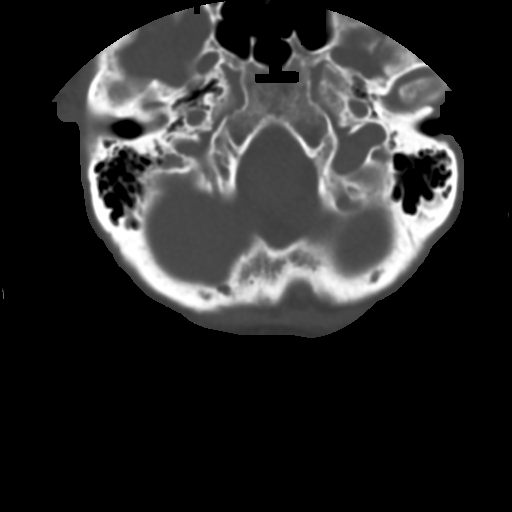
[im 74/81  bone]
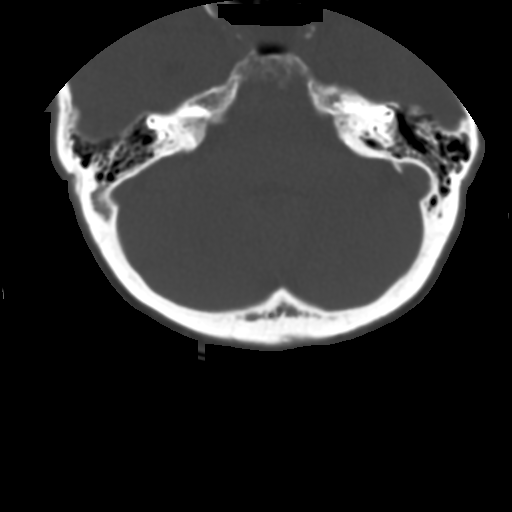

[Series 604: cor bone 2x2 · coronal · 0.39mm/px · 1 of 50 slices shown]
[im 25/50  bone]
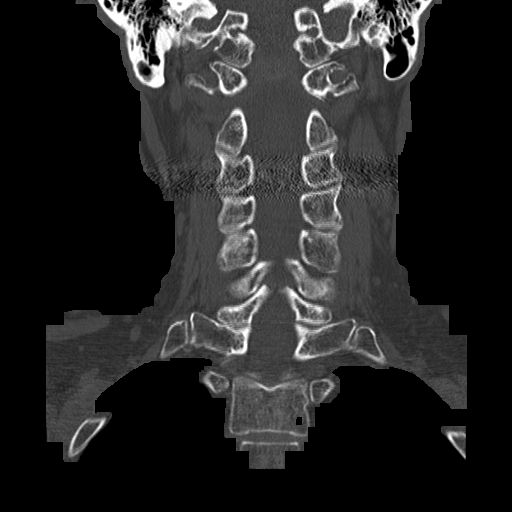

[13 of 20 positions shown; findings below may reference images not displayed]

FINDINGS: The visible contents of the posterior fossa, craniocervical relationship, and prevertebral soft tissues are normal.
No evidence of an acute fracture or posttraumatic malalignment.
The atlantoaxial interval is normal.
There is cervical spondylosis, which leads to spinal canal stenosis, which is mild C5-6 and C6-7. Vertebral osteophytosis and facet hypertrophy cause advanced bilateral C5-6, and left C6-7 neural foraminal stenosis. 1 mm anterolisthesis of C3 on C4, which appears degenerative in nature.
The lung apices are clear.
Advanced calcified atherosclerotic plaque is noted within the proximal right cervical ICA, with probable hemodynamically significant stenosis, unchanged.
IMPRESSION: No CT evidence of acute fracture or traumatic malalignment.
Cervical spine was spinal canal and neural foraminal stenosis as detailed above.
Atherosclerosis.

## 2022-10-02 IMAGING — CR Chest
1 series · 1 of 1 positions shown · non-contrast
Comparison: 06/22/2022

Chest Pain
FINAL REPORT:
XR CHEST 1 VIEW
INDICATION: Chest pain and shortness of breath.
TECHNIQUE: AP view of the chest.

[AP]
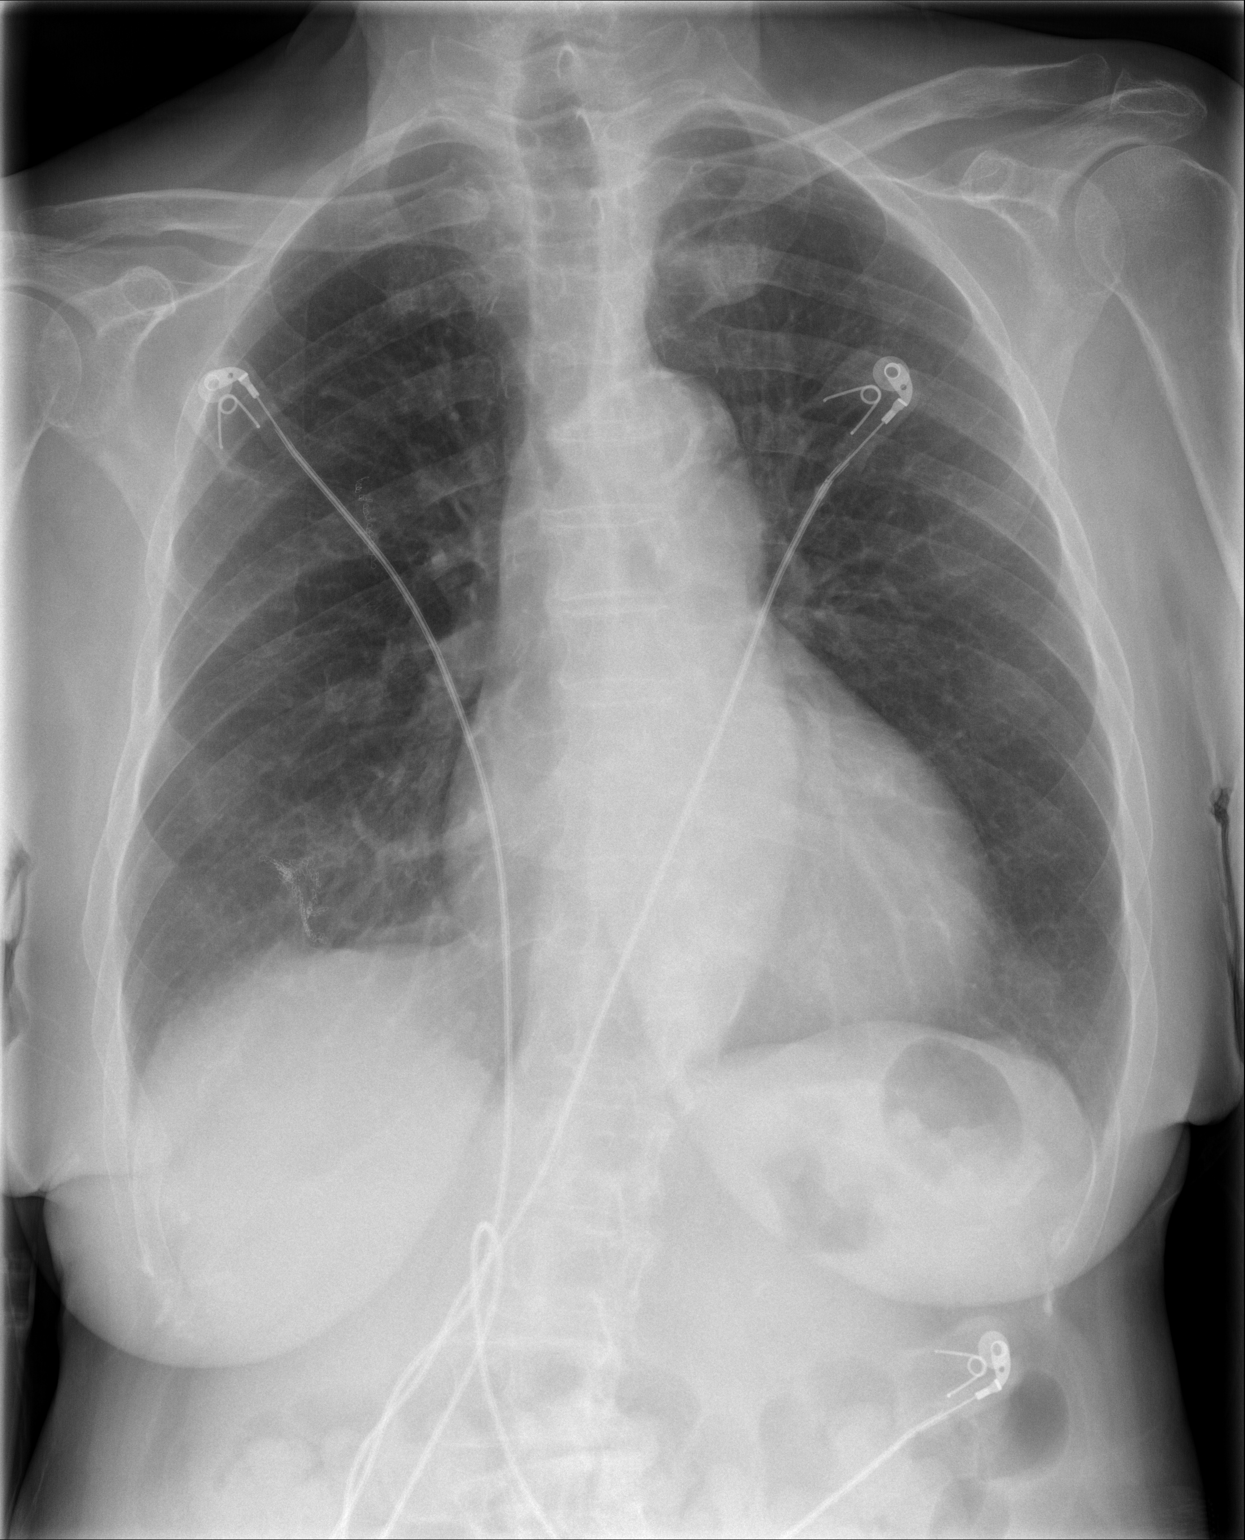

[1 of 1 positions shown; findings below may reference images not displayed]

FINDINGS: The cardiac silhouette is within normal limits. The mediastinum is in the midline. Calcification of the aortic arch. No consolidation or pulmonary edema. Suture material in the right lung base. The costophrenic angles are sharp. No pneumothorax. The bones and soft tissues are unremarkable.
IMPRESSION: 
IMPRESSION: No radiographic evidence of an acute cardiopulmonary process.

## 2022-12-24 IMAGING — DX XR CHEST 1 VIEW
1 series · 1 of 1 positions shown · non-contrast
Comparison: 10/02/2022

Chest pain
FINAL REPORT:
XR CHEST 1 VIEW
INDICATION: Chest pain.
TECHNIQUE: AP view of the chest.

[AP]
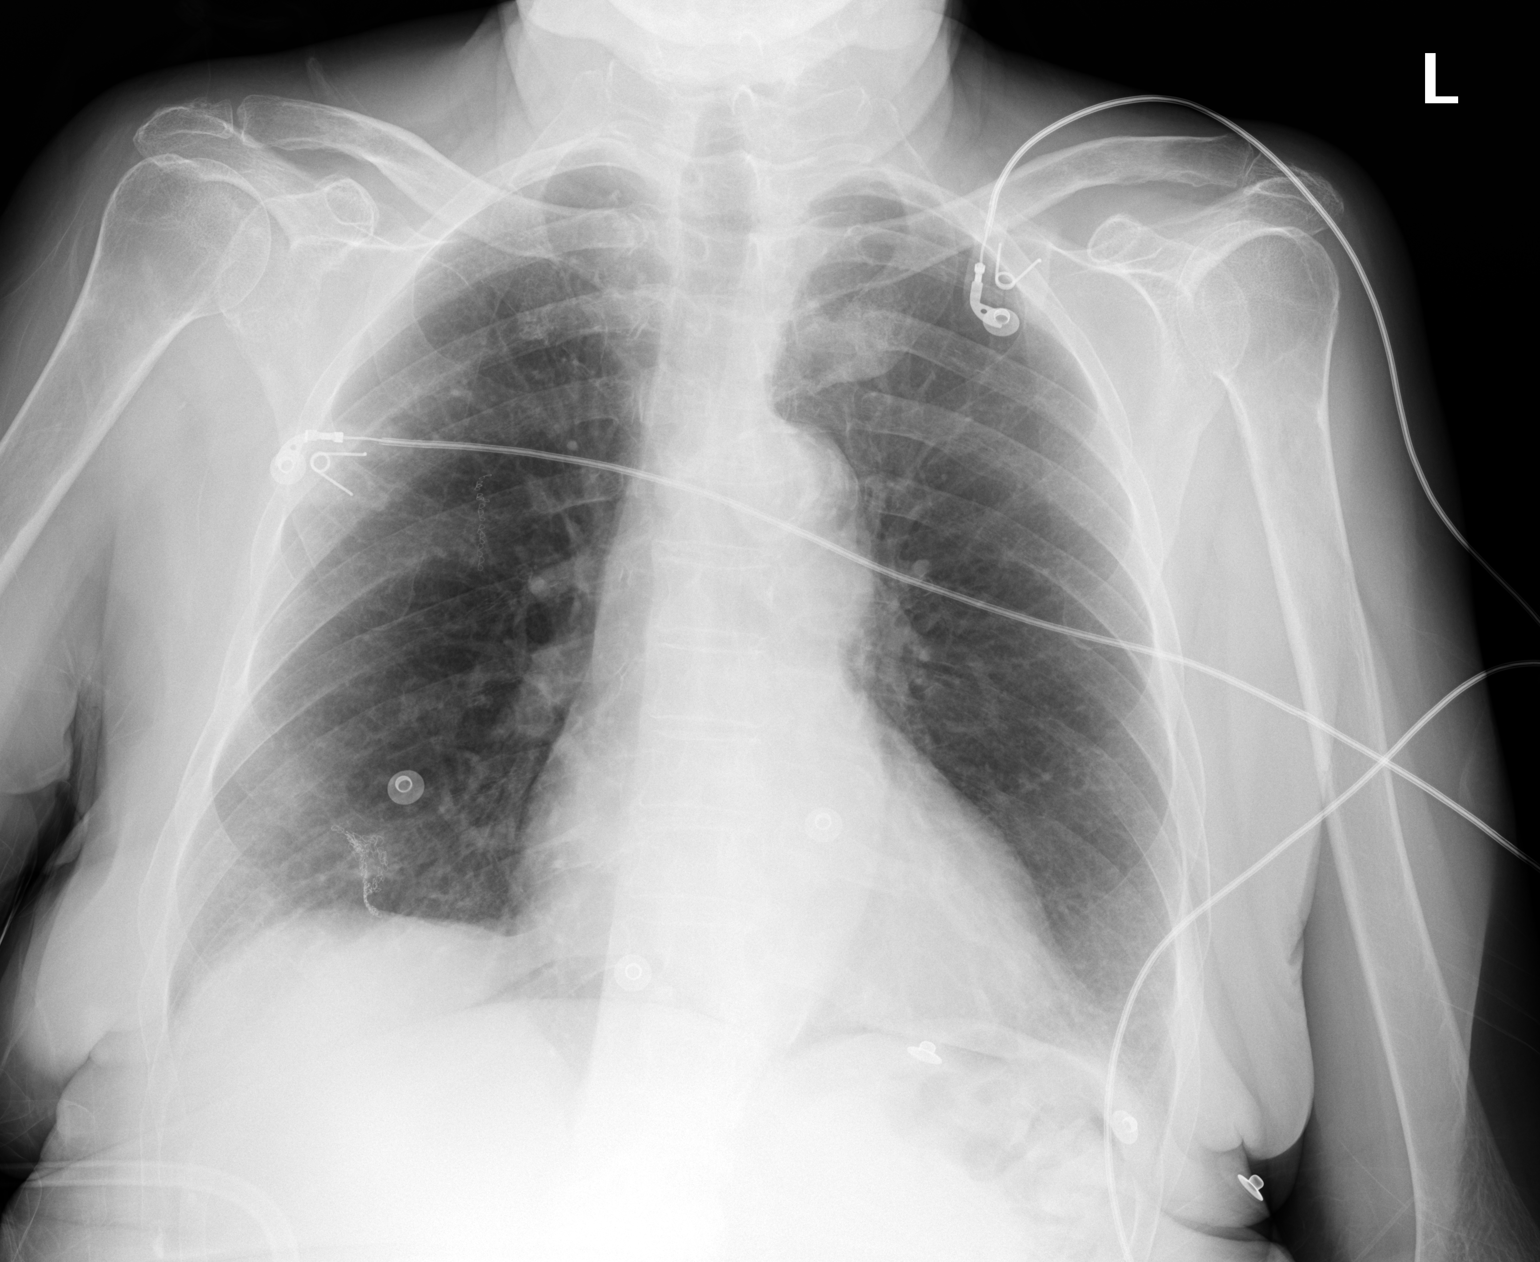

[1 of 1 positions shown; findings below may reference images not displayed]

FINDINGS: The cardiac silhouette is within normal limits. The mediastinum is in the midline. Calcification of the aortic arch. No consolidation or pulmonary edema. Suture material in the right lung base. The costophrenic angles are sharp. No pneumothorax. The bones and soft tissues are unremarkable.
IMPRESSION: 
IMPRESSION: No radiographic evidence of an acute cardiopulmonary process.

## 2023-01-19 IMAGING — CT CT HEAD WITHOUT CONTRAST
3 series · 16 of 47 positions shown, 19 images · non-contrast
Comparison: 07/22/22 head CT.

AMS
FINAL REPORT:
EXAM: CT HEAD WITHOUT CONTRAST
INDICATION: Facial trauma, blunt. Pain.
TECHNIQUE: Multiple axial images of the brain were obtained without contrast. Sagittal and coronal reformations were also performed. CT scans at this facility use of iterative reconstruction technique, dose modulation and/or weight-based dosing when appropriate to reduce radiation dose to as low as reasonably achievable.

[Series 2: head stnd · axial · 0.47mm/px · z∈[+6,+136]mm · 10 of 32 slices shown, 13 images]
[im 3/32  brain]
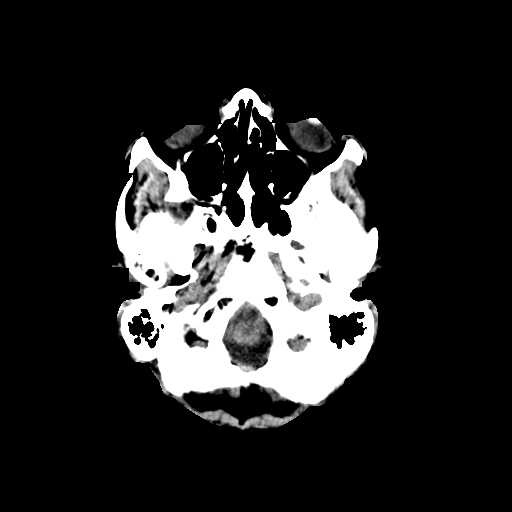
[im 3/32  bone]
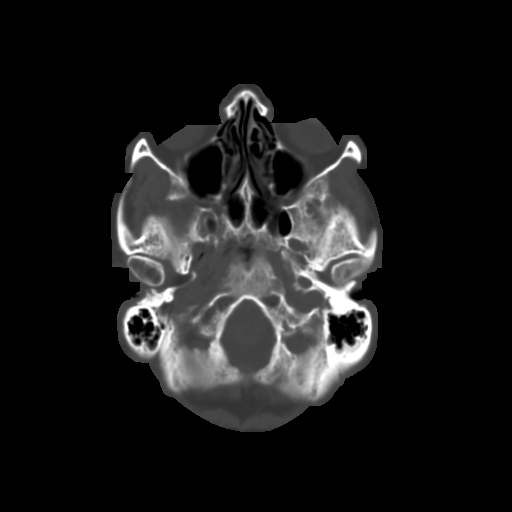
[im 6/32  brain]
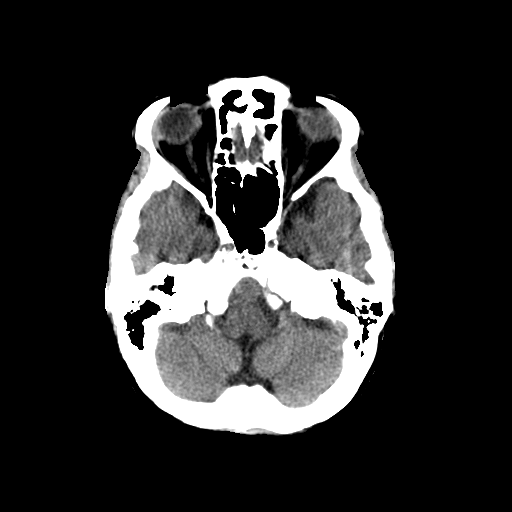
[im 9/32  brain]
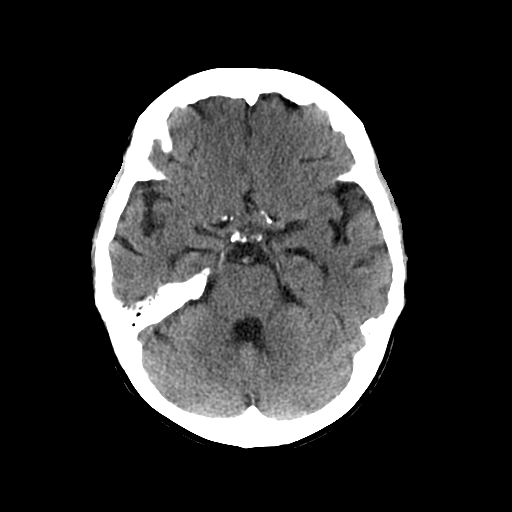
[im 11/32  brain]
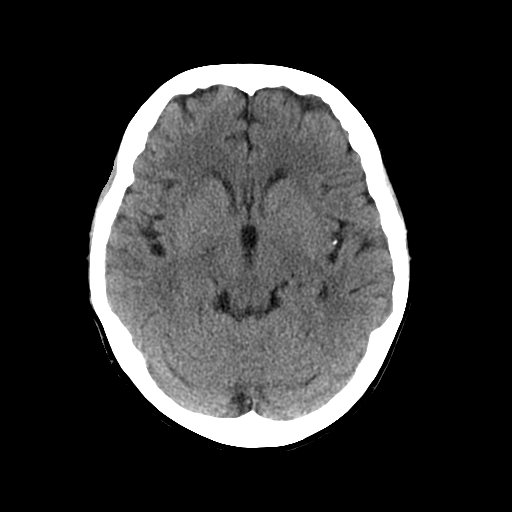
[im 14/32  brain]
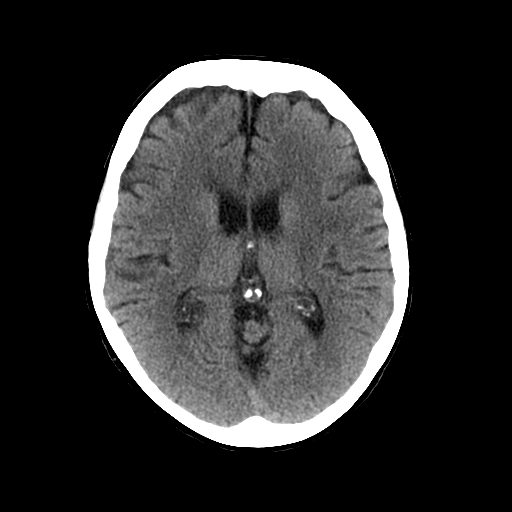
[im 14/32  bone]
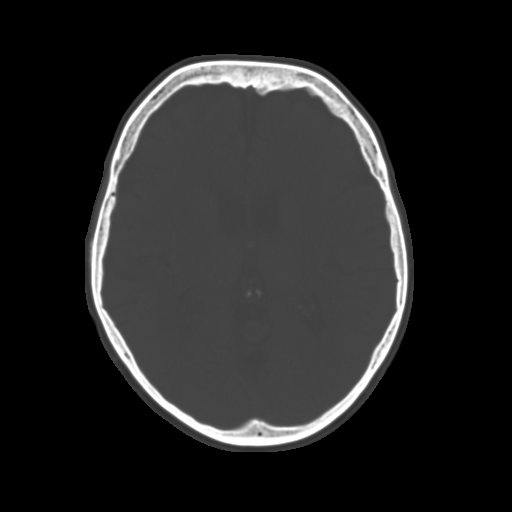
[im 18/32  brain]
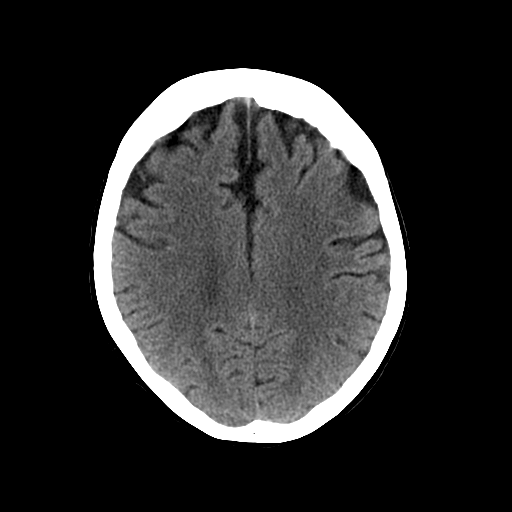
[im 21/32  brain]
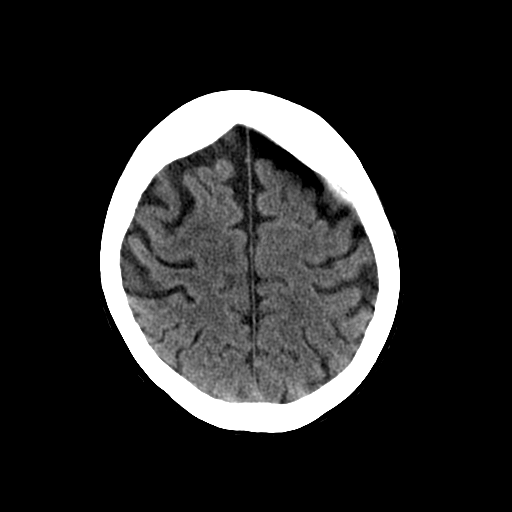
[im 24/32  brain]
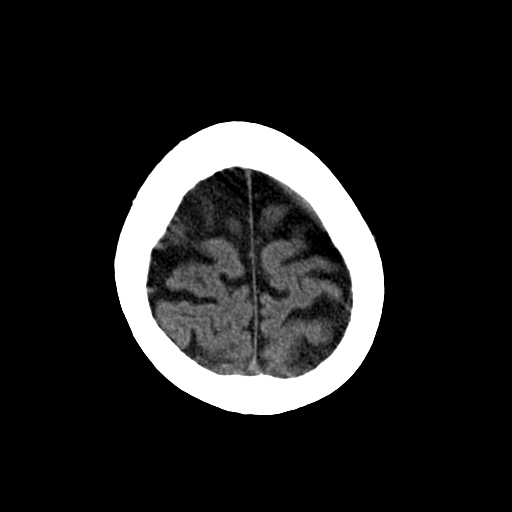
[im 26/32  brain]
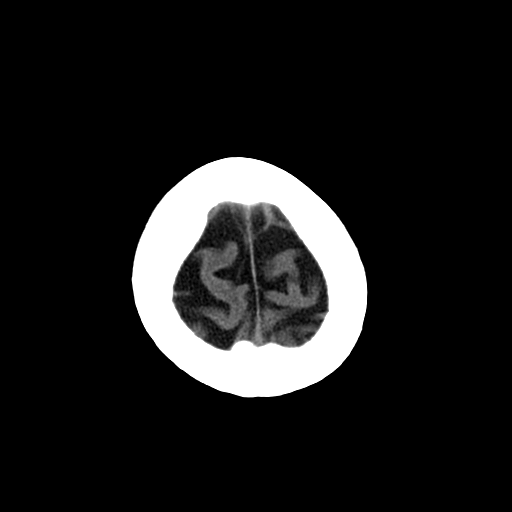
[im 26/32  bone]
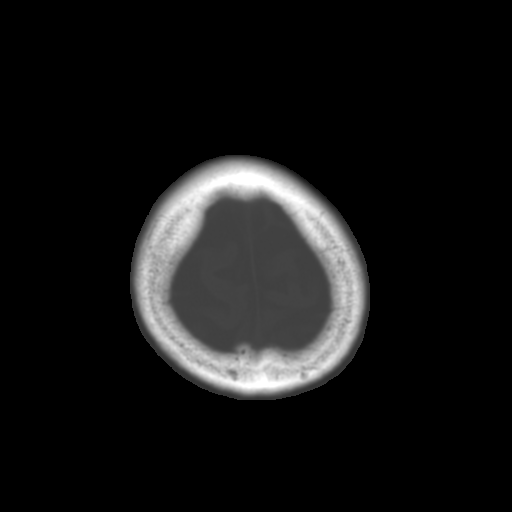
[im 29/32  brain]
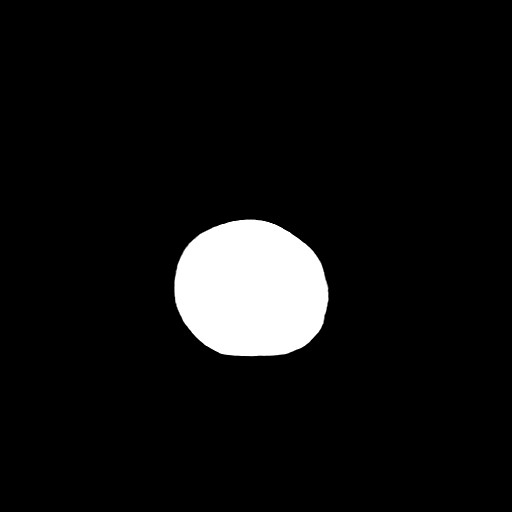

[Series 601: cor head · coronal · 0.47mm/px · 3 of 96 slices shown]
[im 32/96  brain]
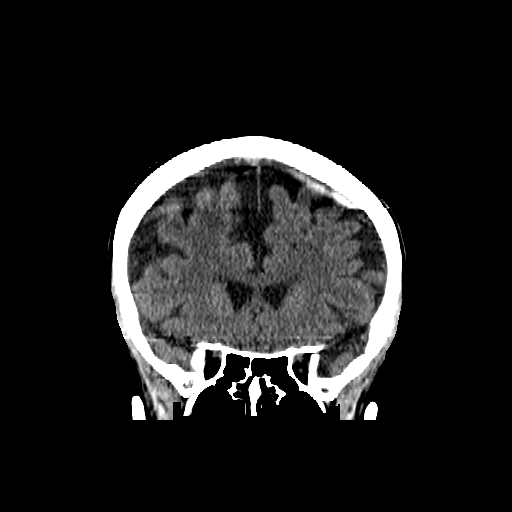
[im 43/96  brain]
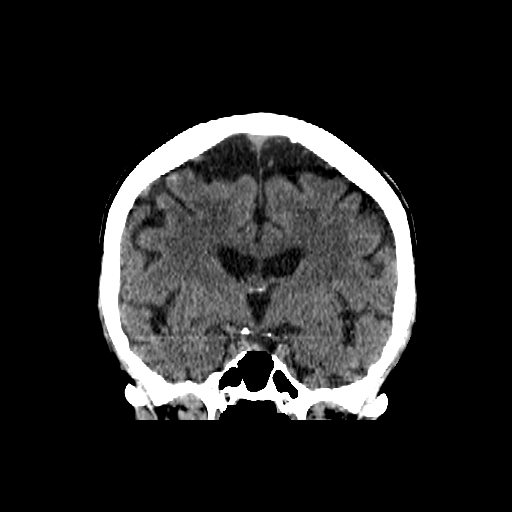
[im 53/96  brain]
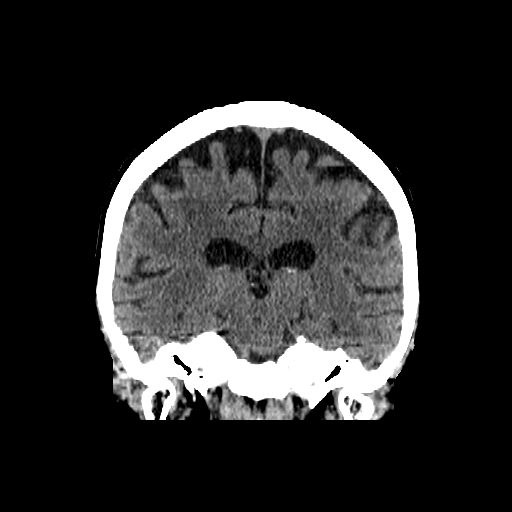

[Series 602: sag head · sagittal · 0.47mm/px · 3 of 83 slices shown]
[im 28/83  brain]
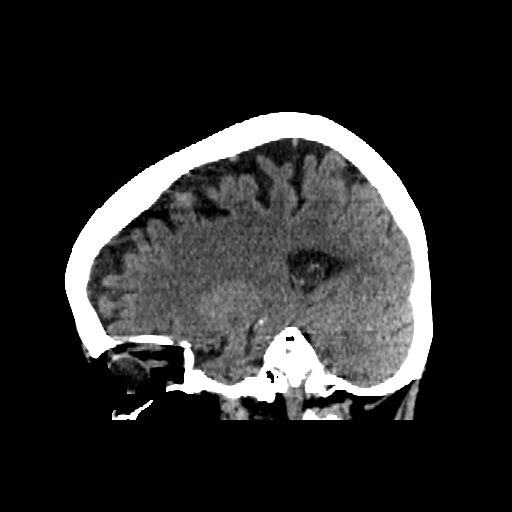
[im 42/83  brain]
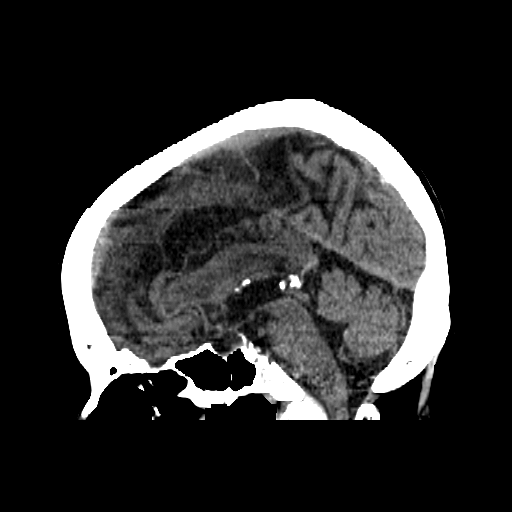
[im 55/83  brain]
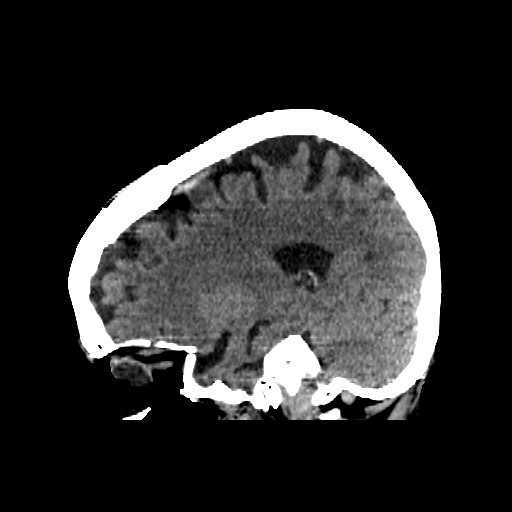

[16 of 47 positions shown; findings below may reference images not displayed]

FINDINGS: The ventricles, sulci, and basal cisterns are prominent, consistent with mild diffuse parenchymal volume loss. No mass effect, midline shift, extra-axial fluid collection, or acute intracranial hemorrhage is seen. The gray-white matter differentiation is preserved. No acute territorial infarct is seen although CT is not sensitive for acute ischemia. The visualized orbits are unremarkable. The visualized paranasal sinuses and mastoid air cells are clear. No depressed calvarial fracture is seen.
IMPRESSION: No CT evidence for an acute intracranial abnormality.

## 2023-11-09 IMAGING — DX XR CHEST 1 VIEW
1 series · 1 of 1 positions shown · non-contrast
Comparison: 12/24/2022

Chest pain, n/v, confusion
FINAL REPORT:
Chest portable one view
INDICATION: chest pain

[AP]
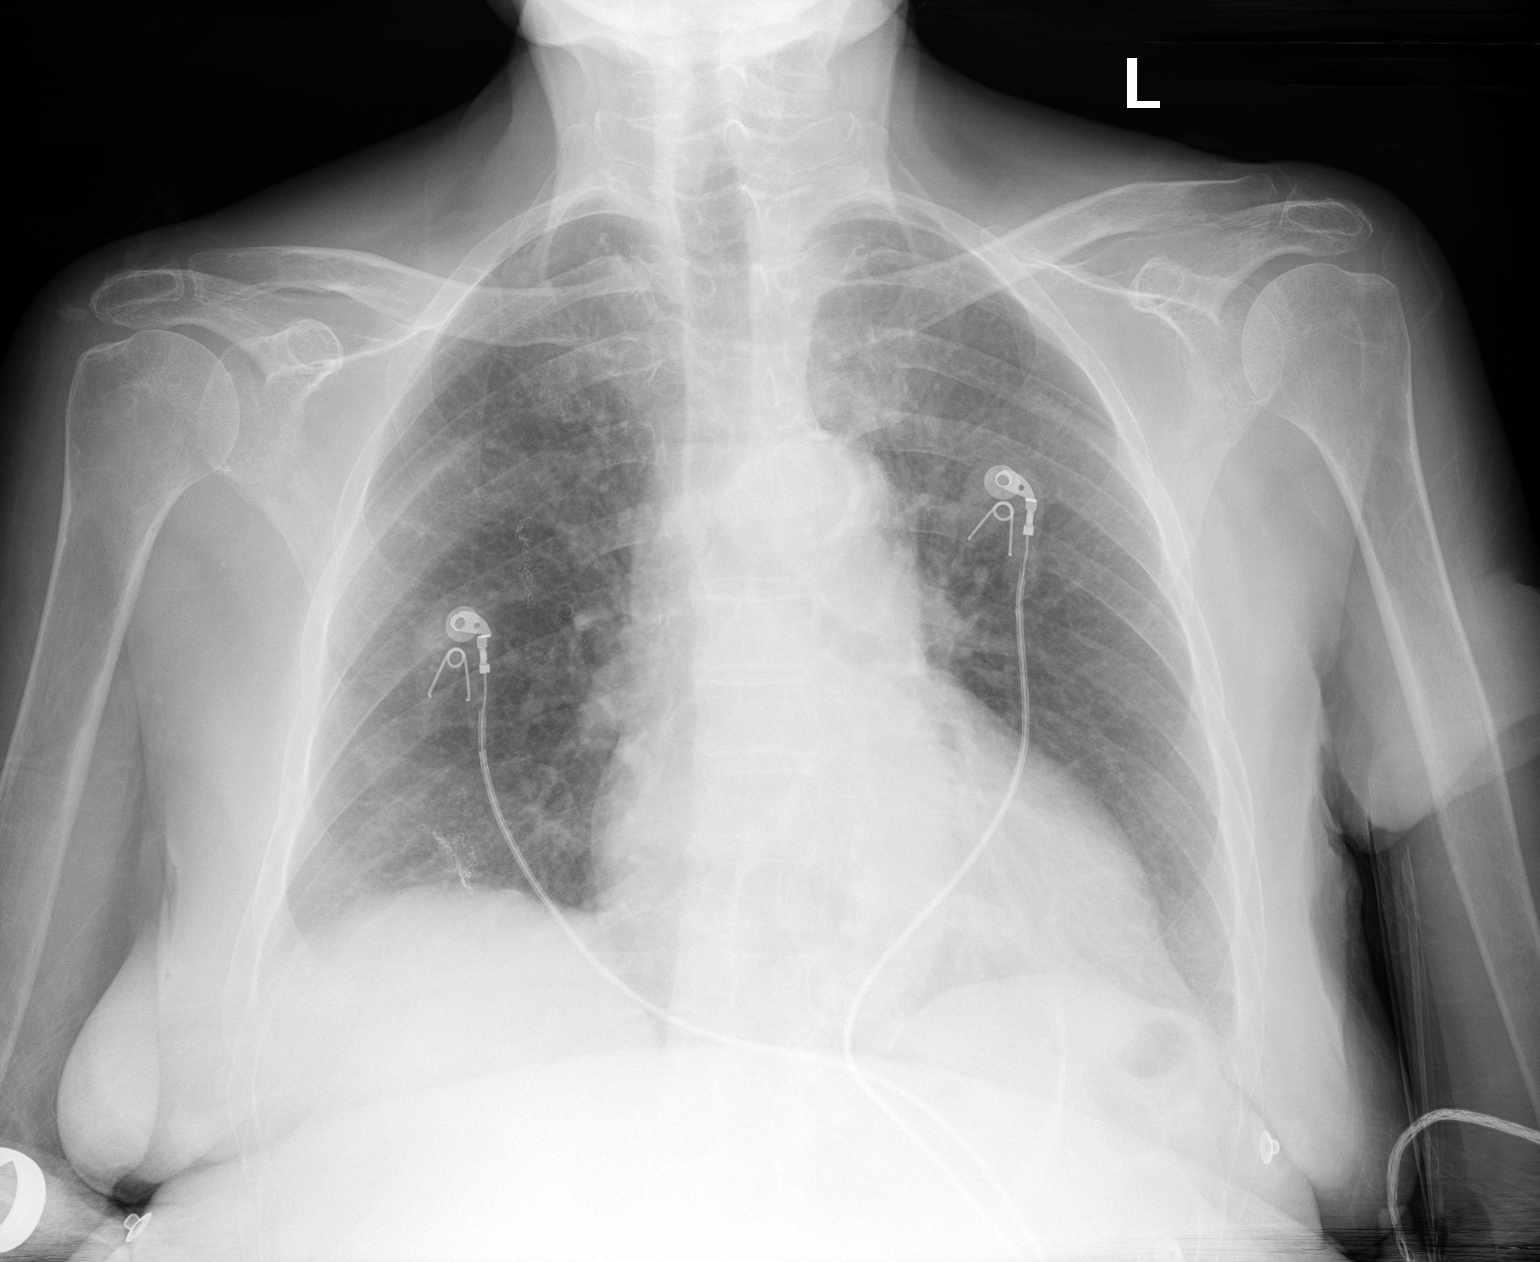

[1 of 1 positions shown; findings below may reference images not displayed]

FINDINGS: There is mild coarsening of the interstitium throughout the lungs which is stable. The cardiac silhouette is within normal limits  given portable technique.  No acute osseous abnormalities.
IMPRESSION: 
IMPRESSION: No acute cardiopulmonary process.

## 2024-05-21 IMAGING — CT CT HEAD WITHOUT CONTRAST
3 of 4 series · 16 of 47 positions shown, 19 images · non-contrast
Comparison: 01/19/2023

Mental status change
FINAL REPORT:
CT HEAD WITHOUT CONTRAST
INDICATION: Altered mental status.
TECHNIQUE: CT of the brain was performed without contrast. Coronal and sagittal reformats were submitted for evaluation.

[Series 4: thins · axial · 0.45mm/px · z∈[-28,+123]mm · 10 of 288 slices shown, 13 images]
[im 23/288  brain]
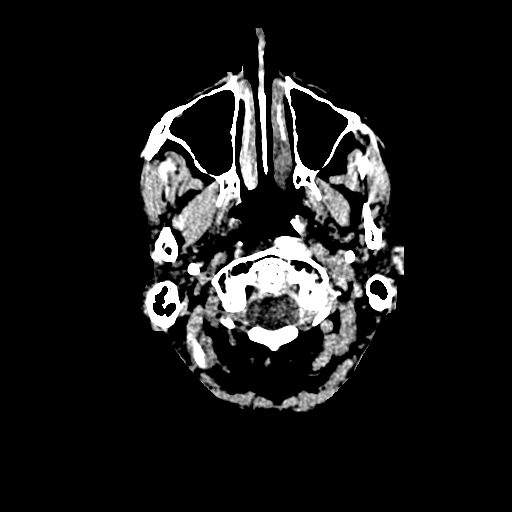
[im 23/288  bone]
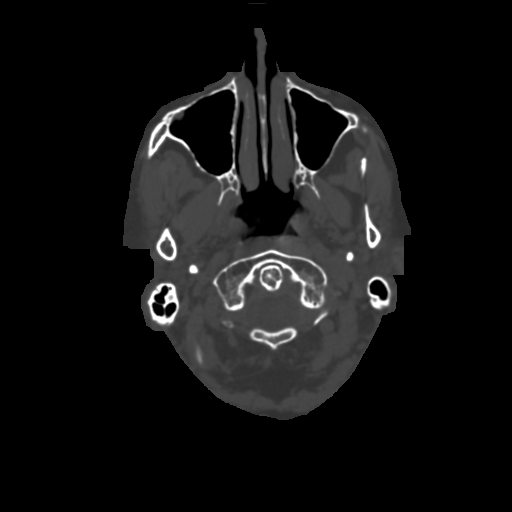
[im 45/288  brain]
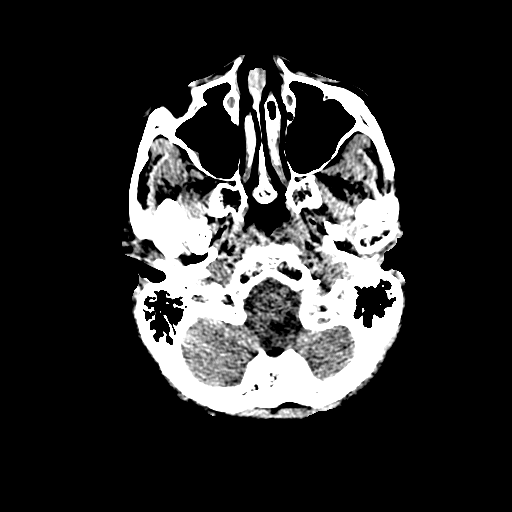
[im 78/288  brain]
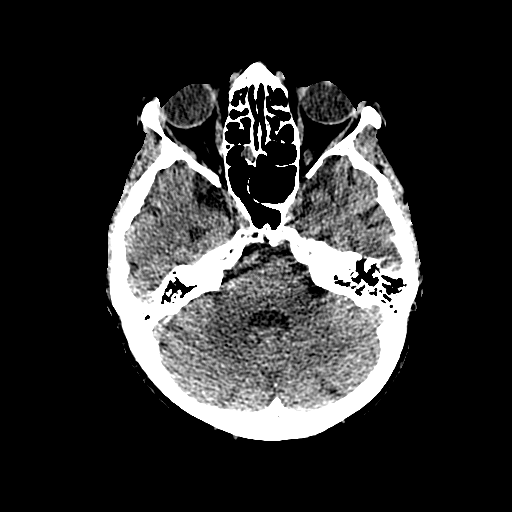
[im 100/288  brain]
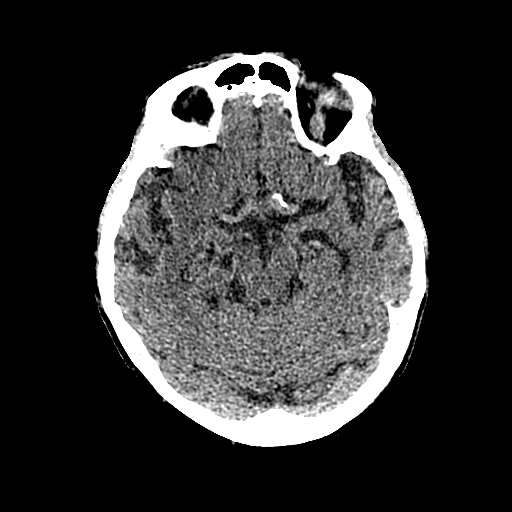
[im 133/288  brain]
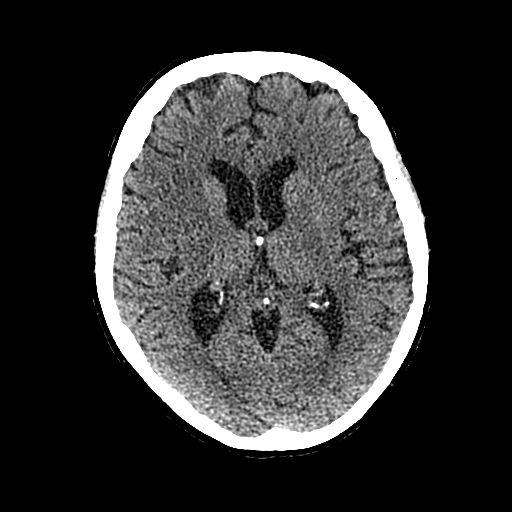
[im 133/288  bone]
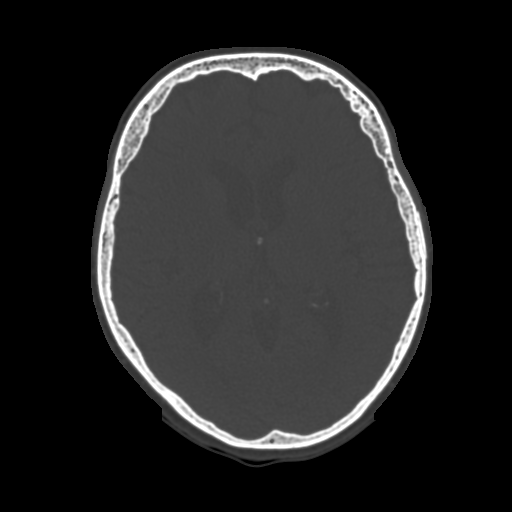
[im 155/288  brain]
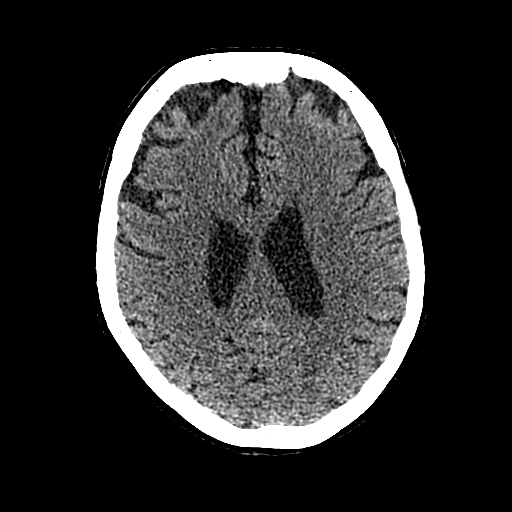
[im 188/288  brain]
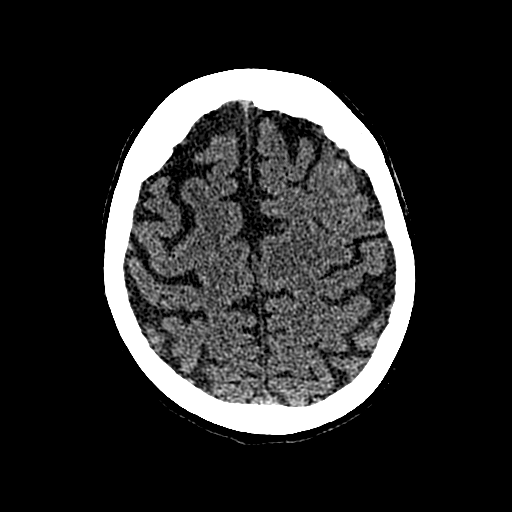
[im 210/288  brain]
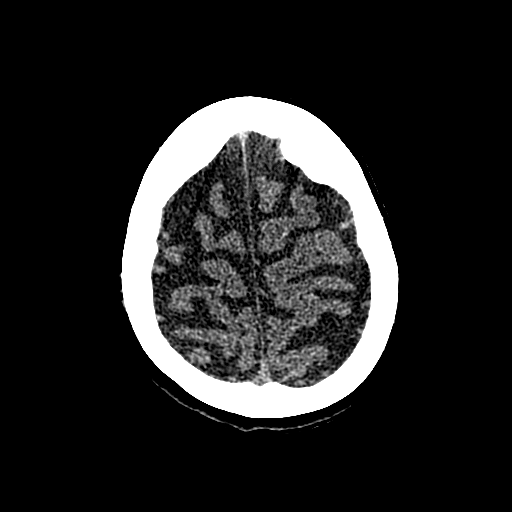
[im 243/288  brain]
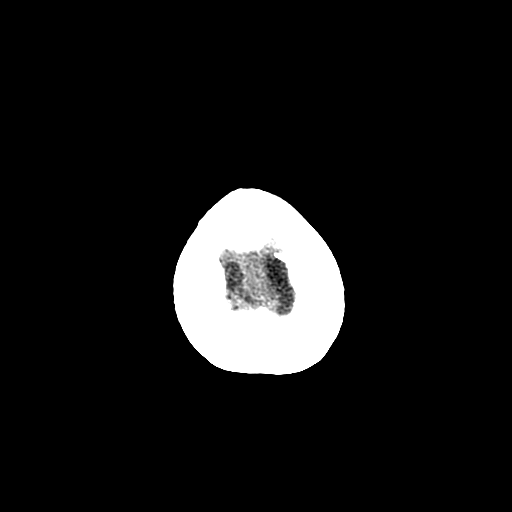
[im 243/288  bone]
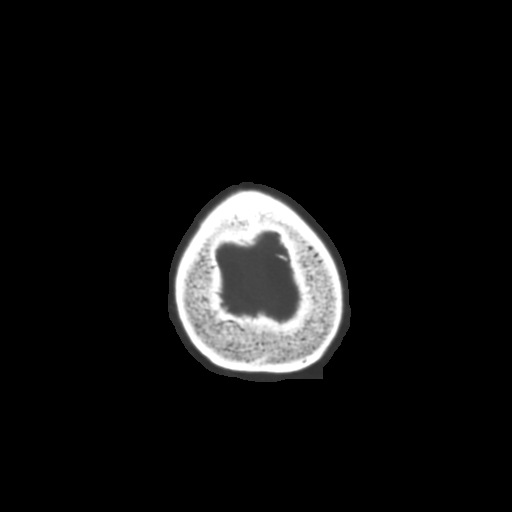
[im 265/288  brain]
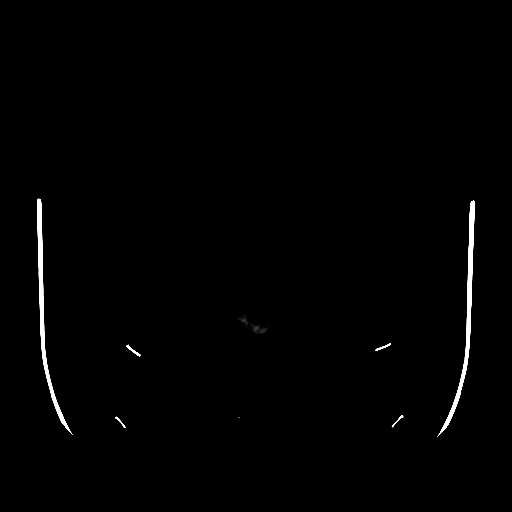

[Series 601: cor head · coronal · 0.45mm/px · 3 of 110 slices shown]
[im 37/110  brain]
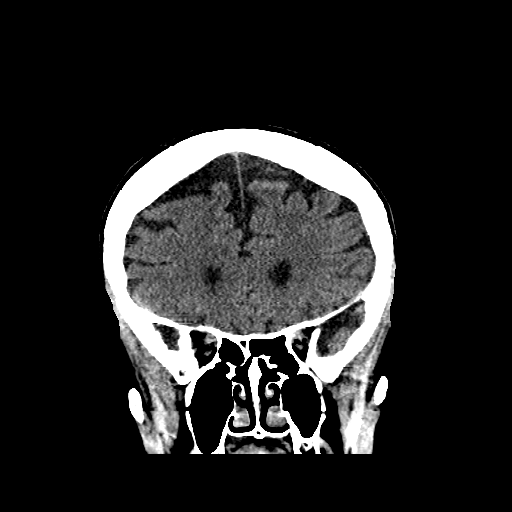
[im 49/110  brain]
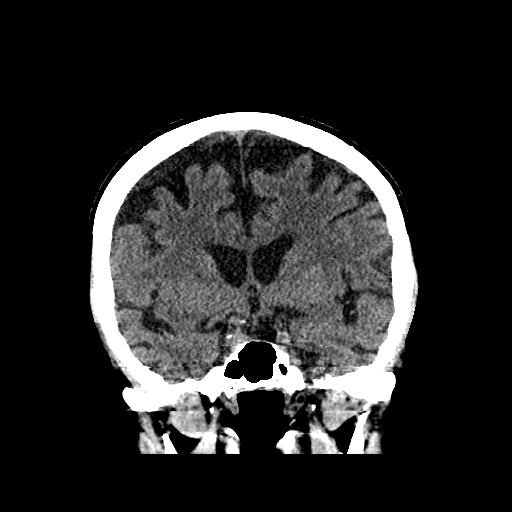
[im 61/110  brain]
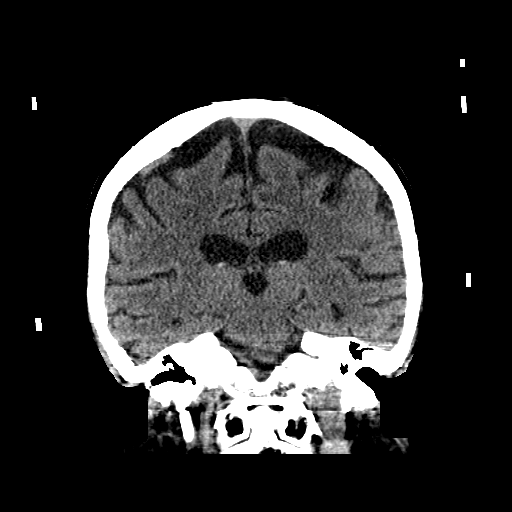

[Series 602: sag head · sagittal · 0.45mm/px · 3 of 84 slices shown]
[im 28/84  brain]
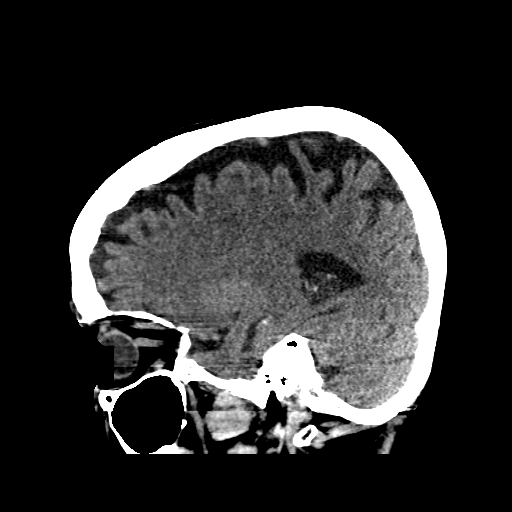
[im 42/84  brain]
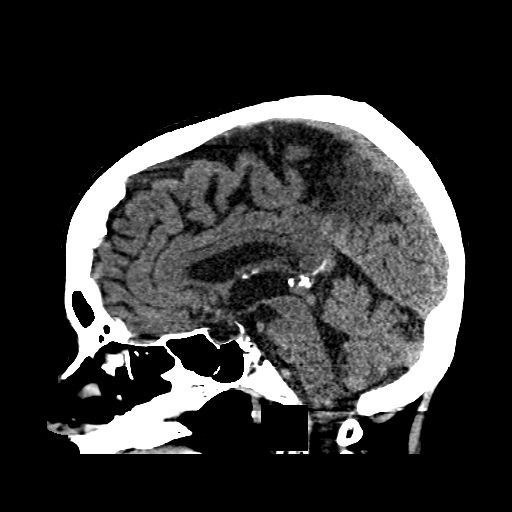
[im 56/84  brain]
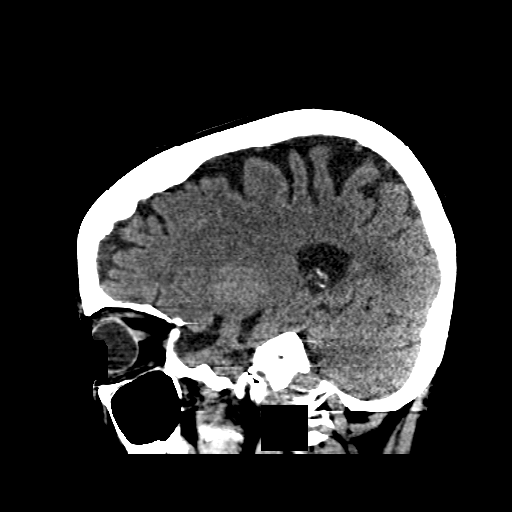

[16 of 47 positions shown; findings below may reference images not displayed]

FINDINGS: There are mild age-related involutional changes. No acute intracranial hemorrhage, mass effect, or hydrocephalus. No territorial hypodensities to suggest acute infarct. Atherosclerotic calcifications. No hyperdense vessel. No extra-axial collections.
The orbits and globes are within normal limits. The mastoid air cells and paranasal sinuses are well aerated. No depressed skull fractures. No suspicious lytic or blastic lesions. Mildly enlarged, partially empty sella.
IMPRESSION: 
IMPRESSION: No evidence of an acute intracranial process.
All CT scans at this facility use iterative reconstruction technique, dose modulation, and/or weight based dosing when appropriate to reduce radiation dose to as low as reasonably achievable.

## 2024-05-21 IMAGING — CR Chest
1 series · 1 of 1 positions shown · non-contrast
Comparison: 11/09/2023

FINAL REPORT:
XR CHEST 1 VIEW
INDICATION: Cough.
TECHNIQUE: AP view of the chest.

[AP]
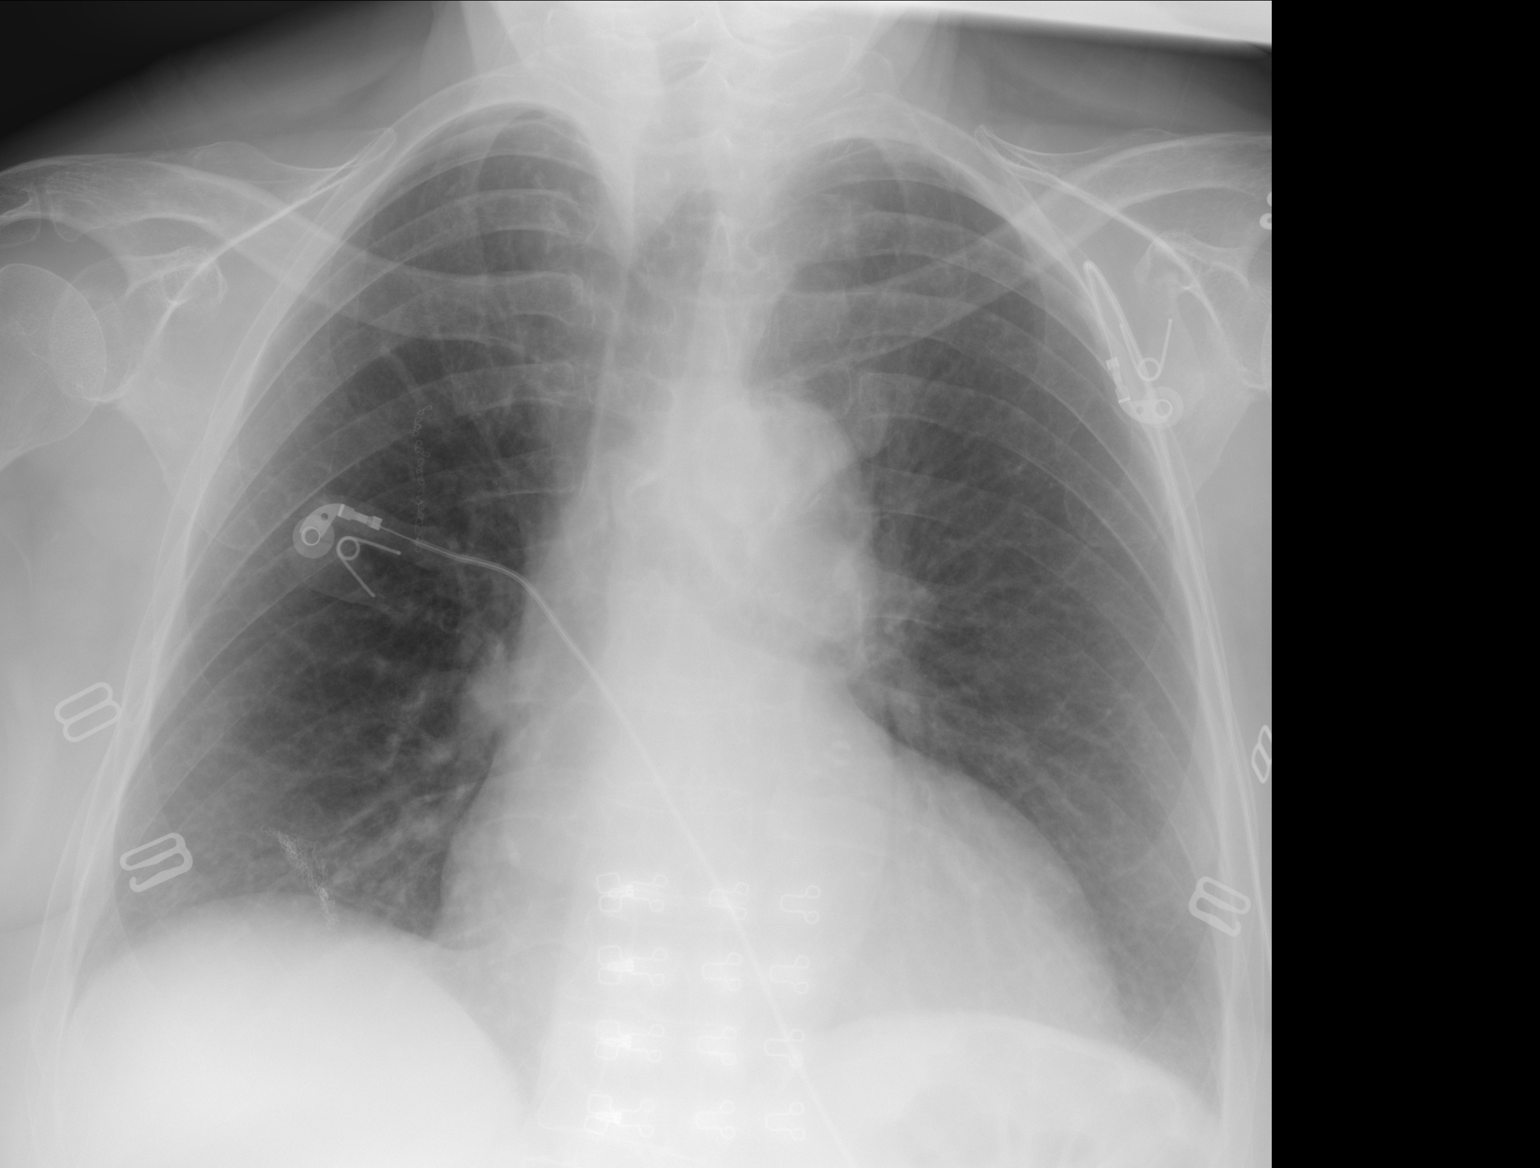

[1 of 1 positions shown; findings below may reference images not displayed]

FINDINGS: The cardiac silhouette is within normal limits. The mediastinum is in the midline. No consolidation or pulmonary edema. The costophrenic angles are sharp. No pneumothorax. The bones and soft tissues are unremarkable.
IMPRESSION: 
IMPRESSION: No radiographic evidence of an acute cardiopulmonary process.

## 2024-06-17 IMAGING — CT CT HEAD WITHOUT CONTRAST
3 of 5 series · 16 of 40 positions shown, 19 images · non-contrast
Comparison: CT head from 11/20/2024
COMPARISON: CT head from 11/20/2024

------------- REPORT [id number] -------------
FINAL REPORT:
CT HEAD WITHOUT CONTRAST
Head trauma, intracranial venous injury suspected
INDICATION: Head trauma, intracranial venous injury suspected Pain
Exam: CT of the head without IV Contrast
All CT scans at this facility use iterative reconstruction technique, dose
modulation and/or weight based dosing when appropriate to reduce radiation dose
to as low as reasonably achievable.

[Series 4: head thin · axial · 0.49mm/px · z∈[-20,+135]mm · 11 of 288 slices shown, 14 images]
[im 24/288  brain]
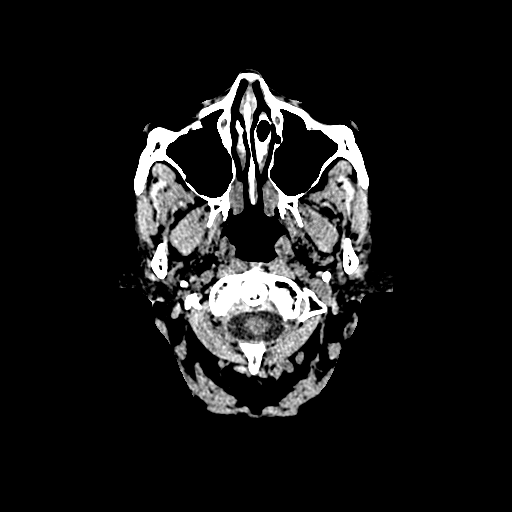
[im 24/288  bone]
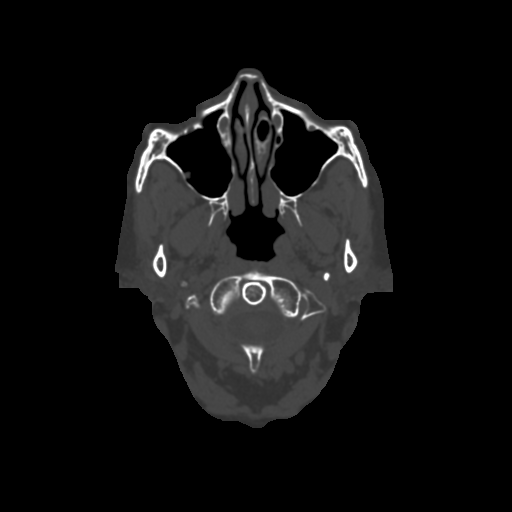
[im 48/288  brain]
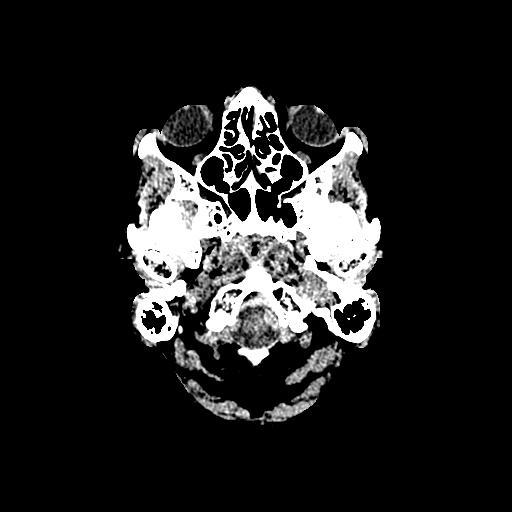
[im 72/288  brain]
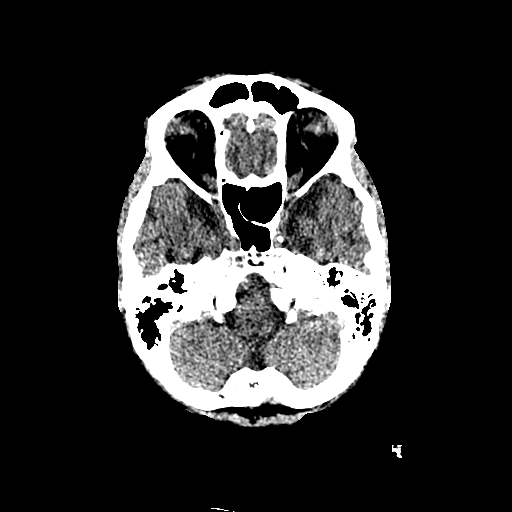
[im 96/288  brain]
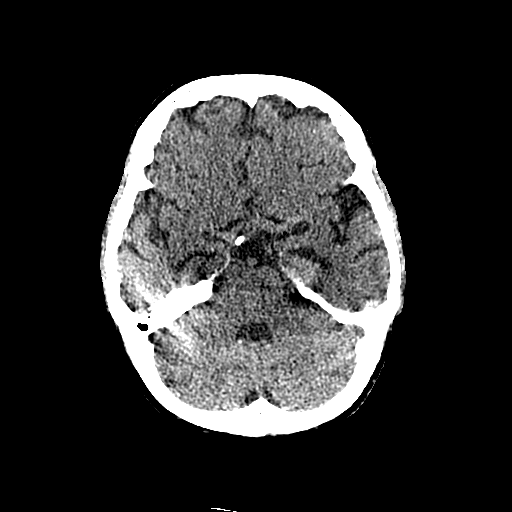
[im 120/288  brain]
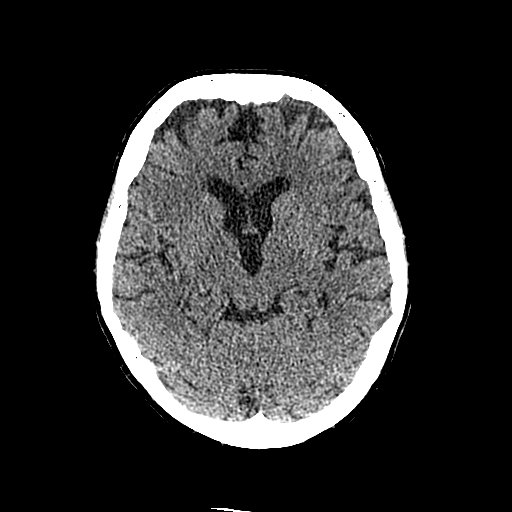
[im 120/288  bone]
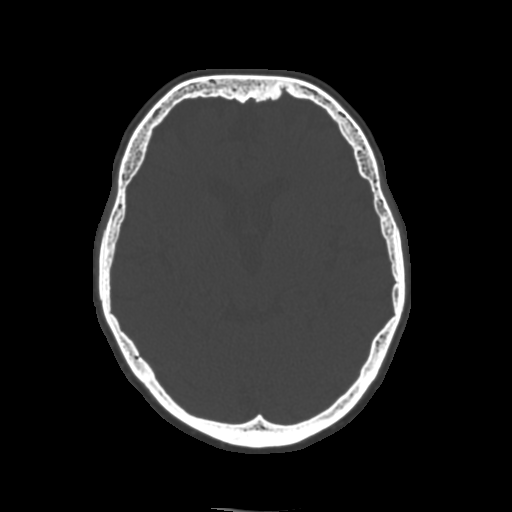
[im 144/288  brain]
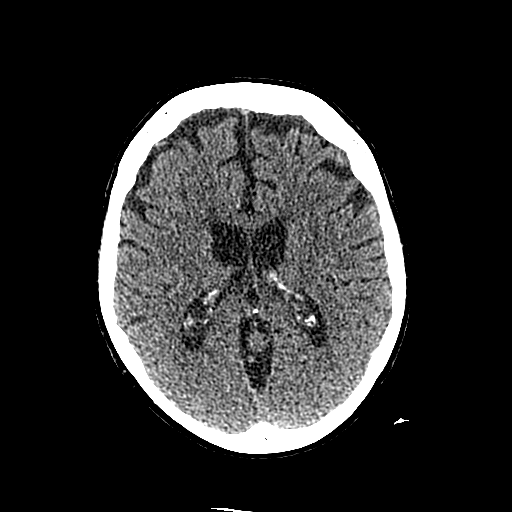
[im 168/288  brain]
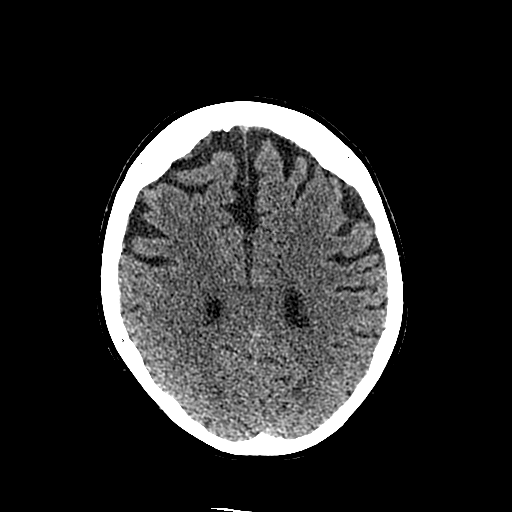
[im 192/288  brain]
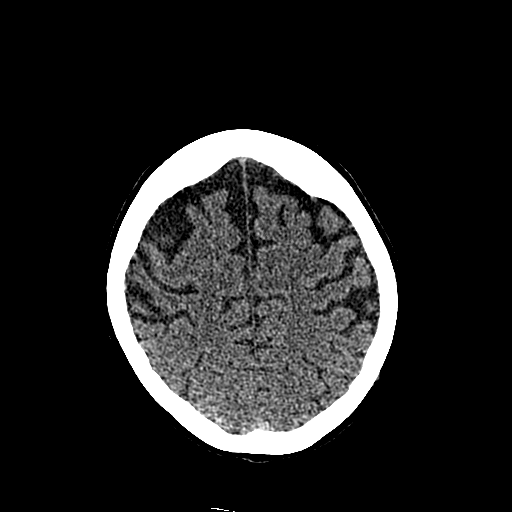
[im 216/288  brain]
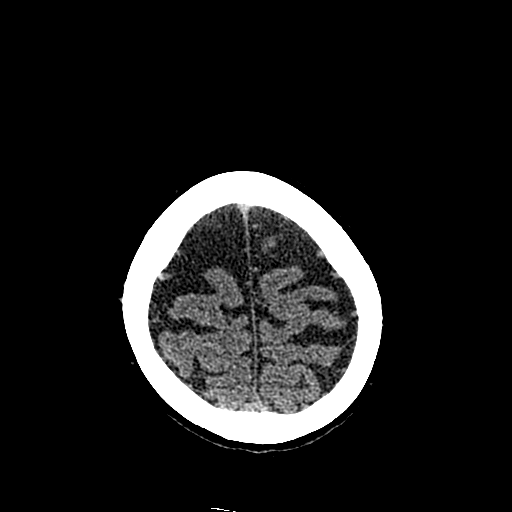
[im 216/288  bone]
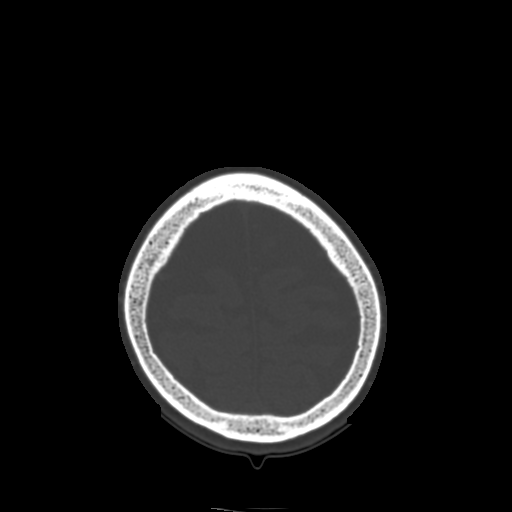
[im 240/288  brain]
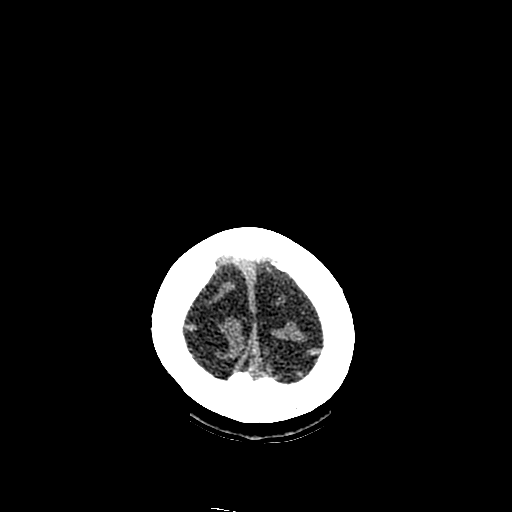
[im 264/288  brain]
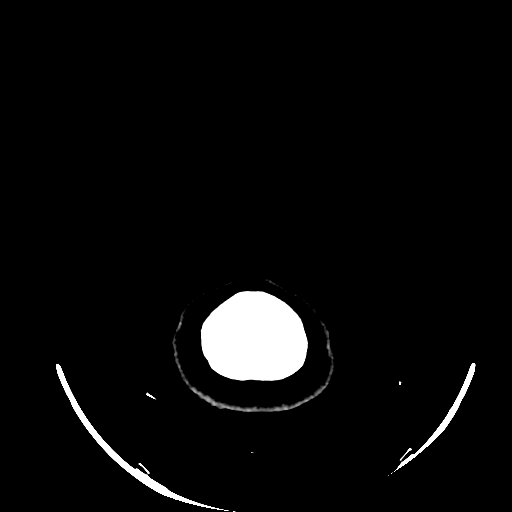

[Series 11: c-spine thins · axial · 0.35mm/px · z∈[-188,-158]mm · 2 of 328 slices shown]
[im 24/328  brain]
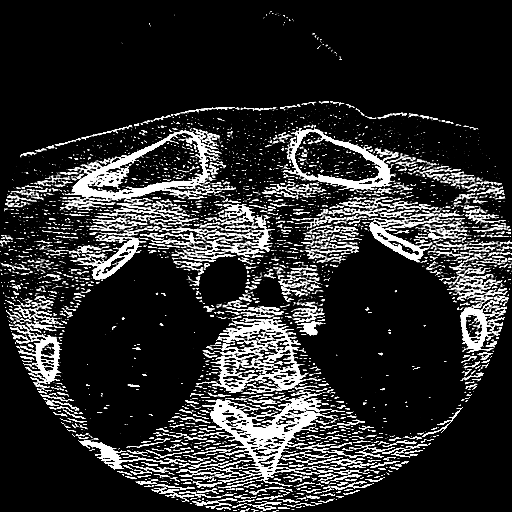
[im 71/328  brain]
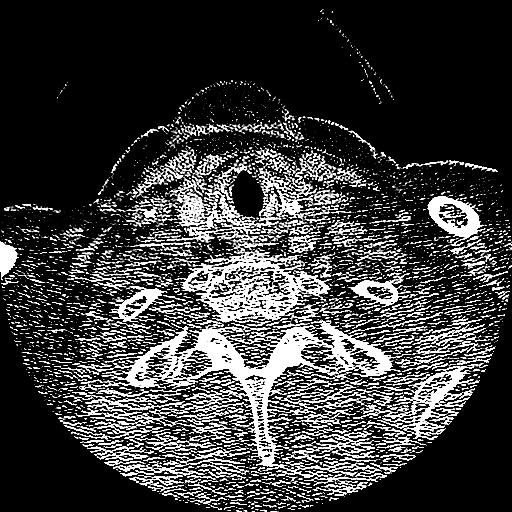

[Series 601: cor head · coronal · 0.49mm/px · 3 of 108 slices shown]
[im 30/108  brain]
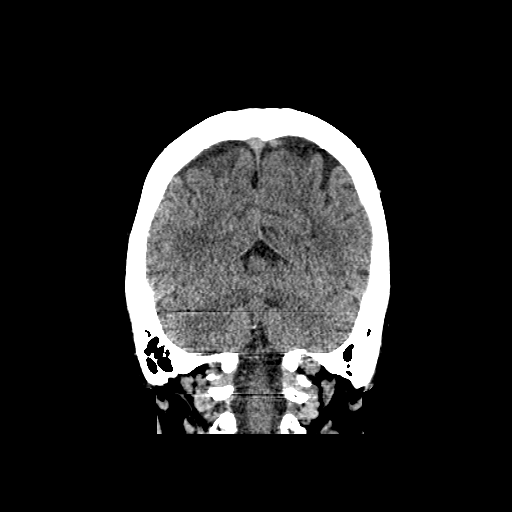
[im 43/108  brain]
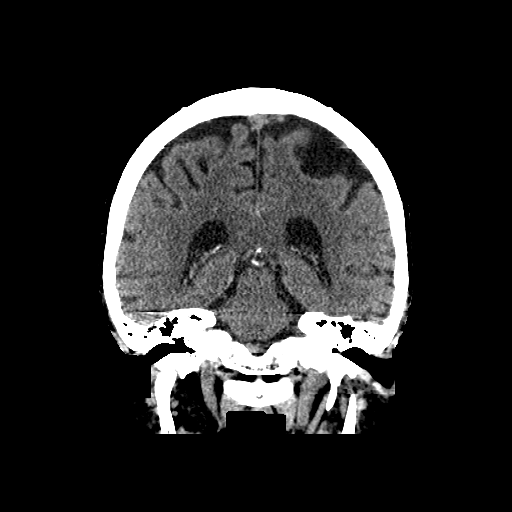
[im 56/108  brain]
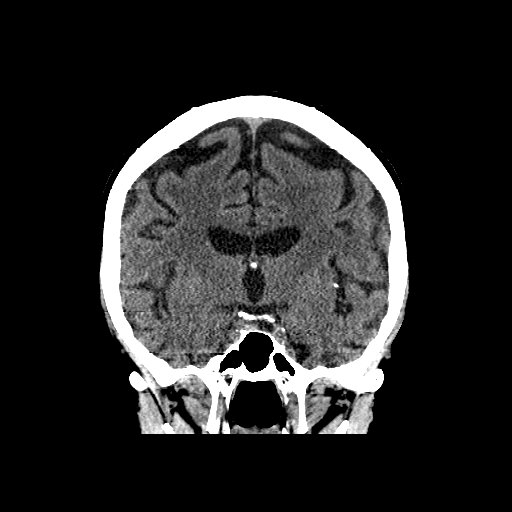

[16 of 40 positions shown; findings below may reference images not displayed]

FINDINGS: Extra-axial spaces: Unremarkable
Intracranial hemorrhage: None.
Ventricular system: The ventricles are not significantly dilated or effaced.
Basal cisterns: Patent
Cerebral parenchyma: Unremarkable
Midline shift: None.
Vascular system: Normal.
Paranasal sinuses and mastoid air cells: Clear.
Visualized orbits: Unremarkable.
Sella: Unremarkable.
Calvarium: Normal.
IMPRESSION: 
IMPRESSION: 1. No acute intracranial abnormality.
AM

------------- REPORT [id number] -------------
FINAL REPORT:
CT HEAD WITHOUT CONTRAST
Head trauma, intracranial venous injury suspected
FINDINGS: Extra-axial spaces: Unremarkable
Intracranial hemorrhage: None.
Ventricular system: The ventricles are not significantly dilated or effaced.
Basal cisterns: Patent
Cerebral parenchyma: Unremarkable
Midline shift: None.
Vascular system: Normal.
Paranasal sinuses and mastoid air cells: Clear.
Visualized orbits: Unremarkable.
Sella: Unremarkable.
Calvarium: Normal.
IMPRESSION: 
IMPRESSION: 1. No acute intracranial abnormality.
AM

## 2024-06-17 IMAGING — CT CT MAXILLOFACIAL WITHOUT CONTRAST
4 series · 17 of 47 positions shown, 19 images · non-contrast
Comparison: None available
COMPARISON: None available

------------- REPORT [id number] -------------
FINAL REPORT:
CT MAXILLOFACIAL WITHOUT CONTRAST
trauma: fall onto face
INDICATION: trauma: fall onto face Pain
Exam: CT of the Facial Bones without IV Contrast
All CT scans at this facility use iterative reconstruction technique, dose
modulation and/or weight based dosing when appropriate to reduce radiation dose
to as low as reasonably achievable.

[Series 5: face · axial · 0.37mm/px · z∈[-136,+11]mm · 8 of 151 slices shown, 10 images]
[im 17/151  brain]
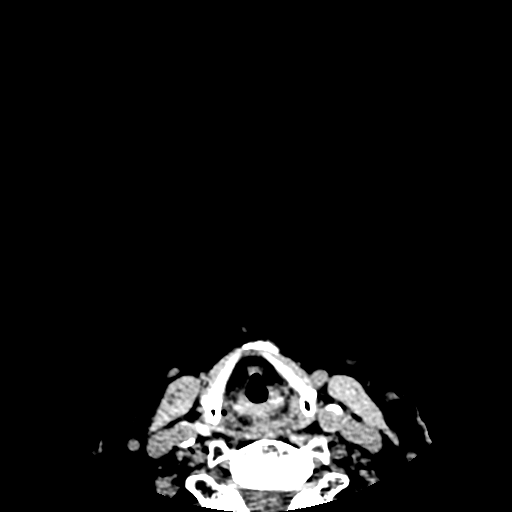
[im 17/151  bone]
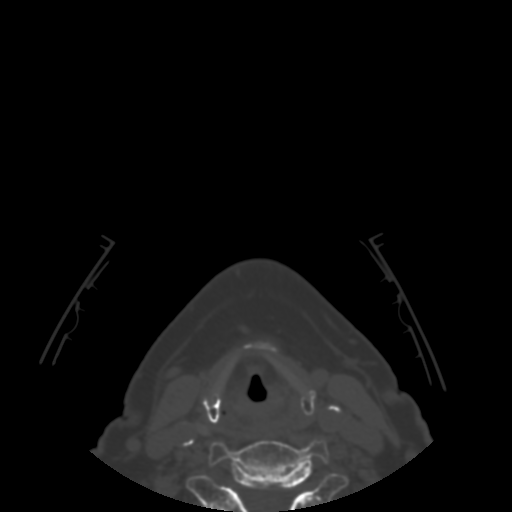
[im 34/151  bone]
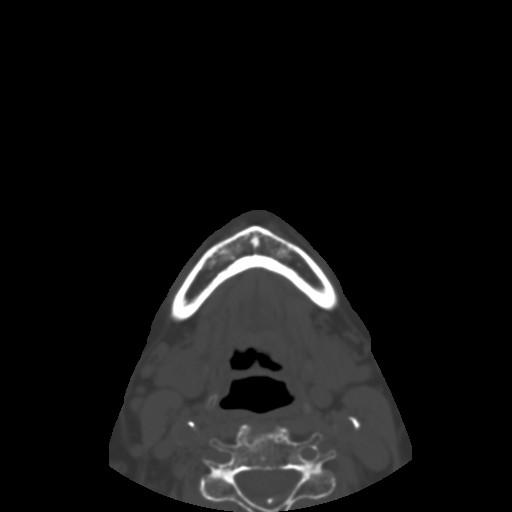
[im 51/151  bone]
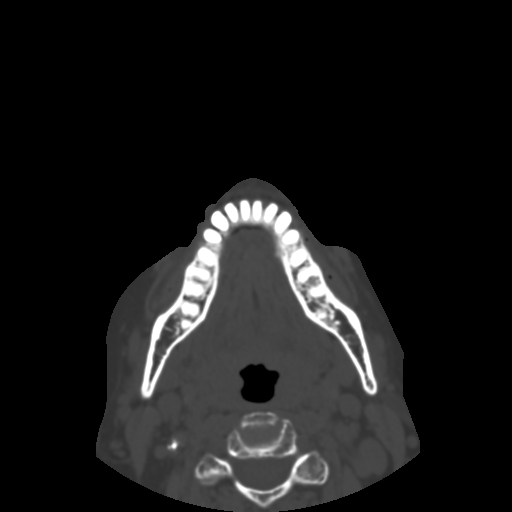
[im 67/151  bone]
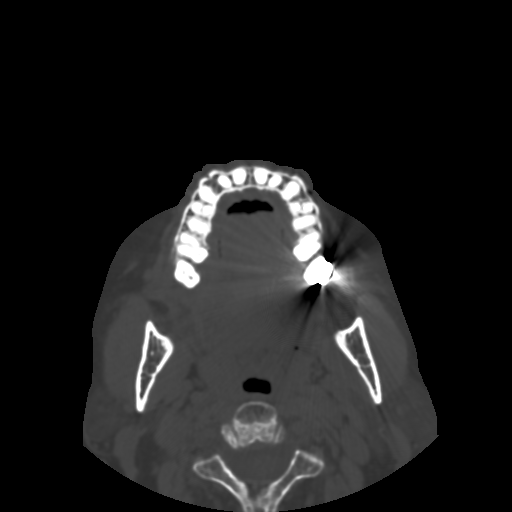
[im 84/151  brain]
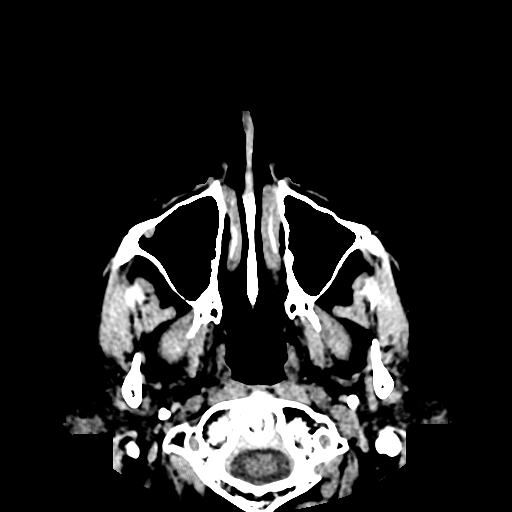
[im 84/151  bone]
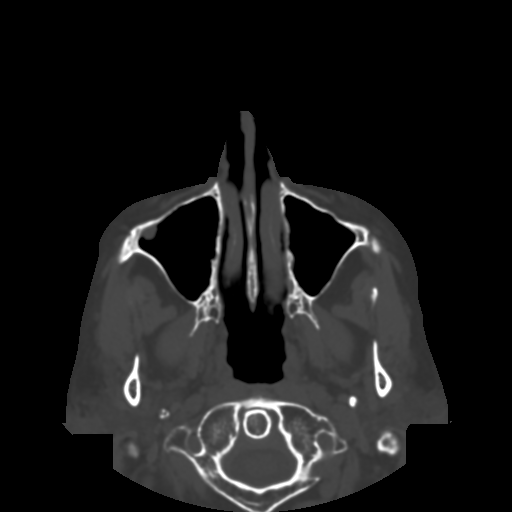
[im 101/151  bone]
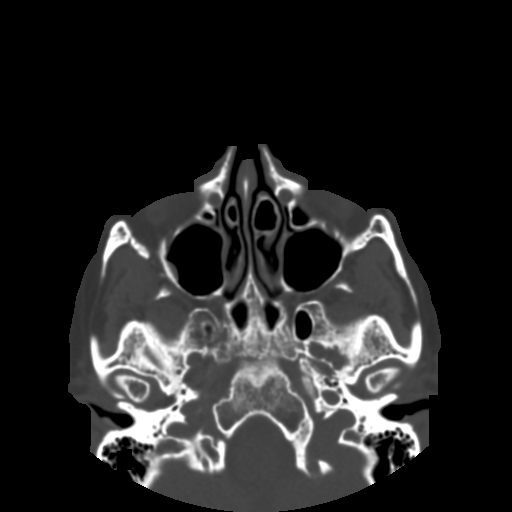
[im 117/151  bone]
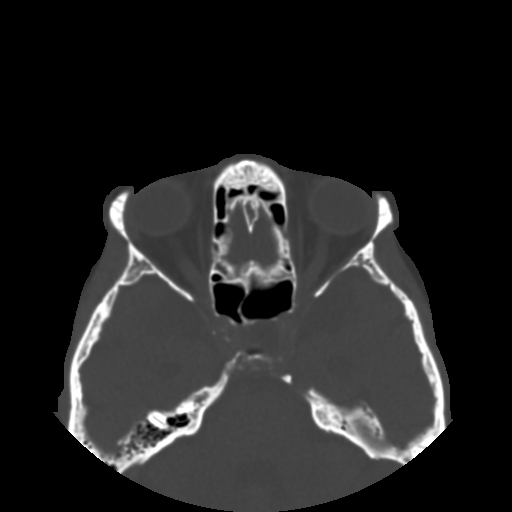
[im 134/151  bone]
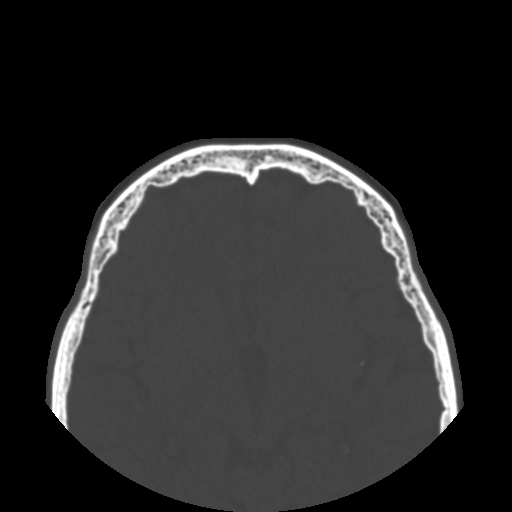

[Series 7: face stnd thin · axial · 0.37mm/px · z∈[-136,-96]mm · 3 of 302 slices shown]
[im 32/302  brain]
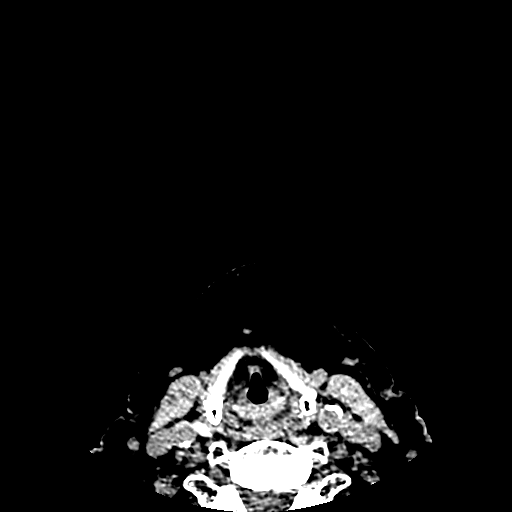
[im 64/302  brain]
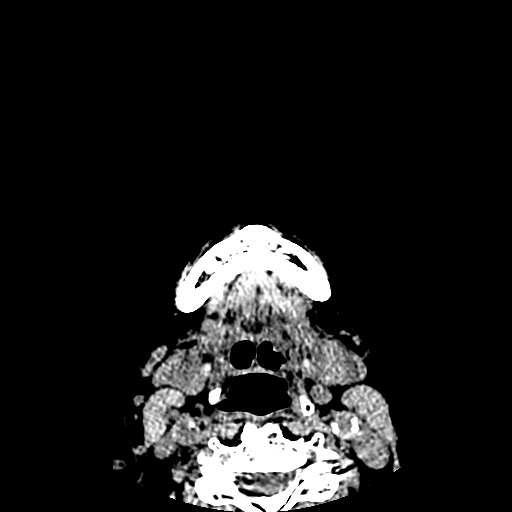
[im 96/302  brain]
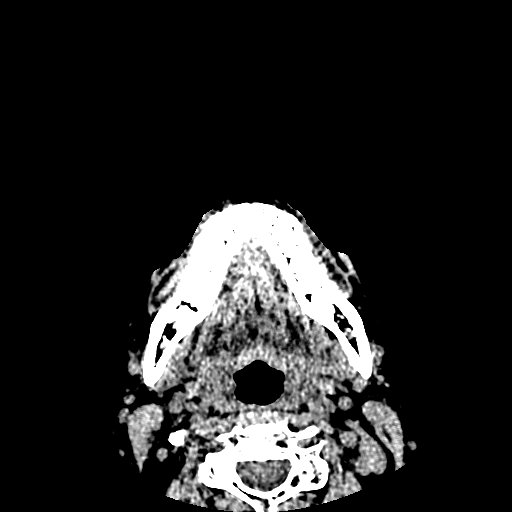

[Series 604: cor bone 2x2 · coronal · 0.37mm/px · 3 of 83 slices shown]
[im 28/83  bone]
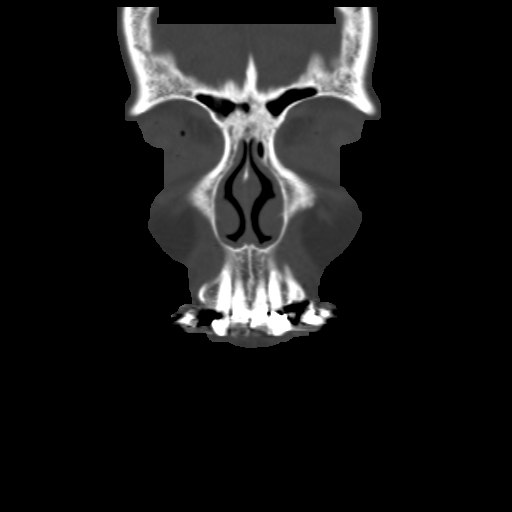
[im 42/83  bone]
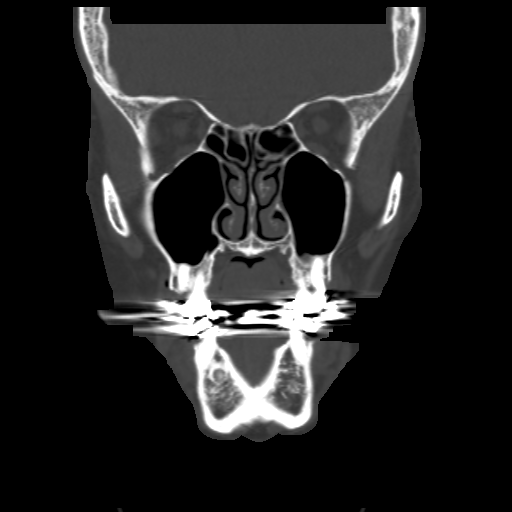
[im 55/83  bone]
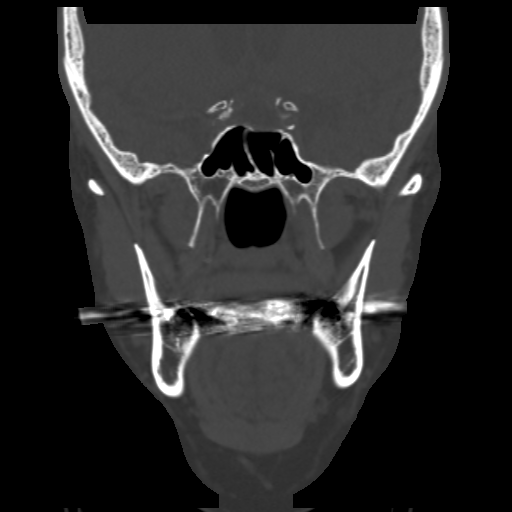

[Series 605: sag bone 2x2 · sagittal · 0.37mm/px · 3 of 80 slices shown]
[im 27/80  bone]
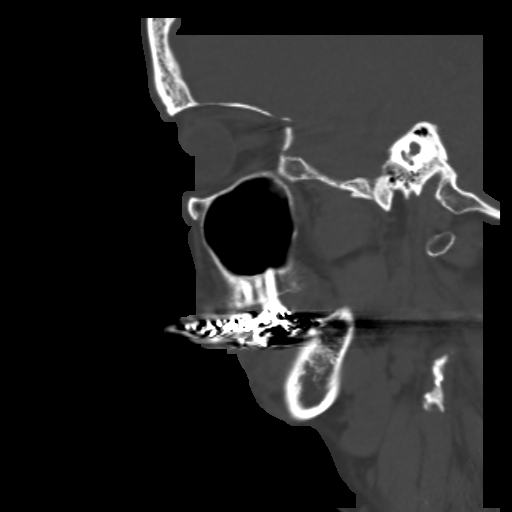
[im 36/80  bone]
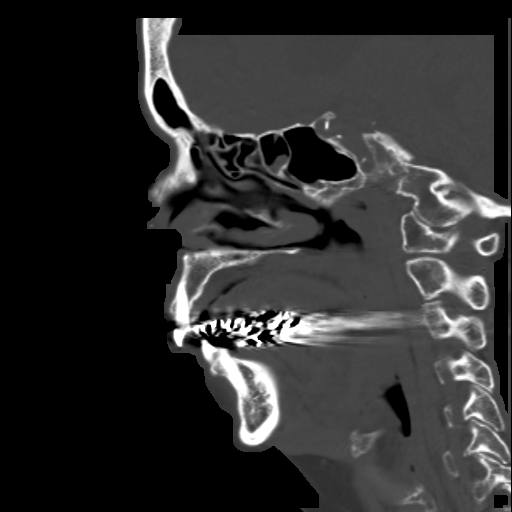
[im 44/80  bone]
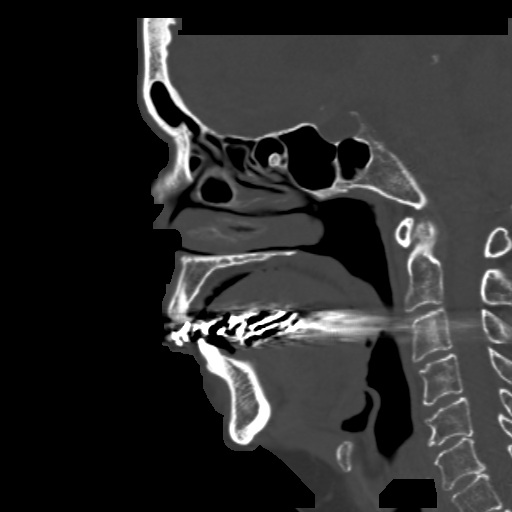

[17 of 47 positions shown; findings below may reference images not displayed]

FINDINGS: Facial bones: No facial fracture is identified.
Pterygoid plates: Intact
Orbits: Unremarkable. Both globes, extraocular muscles, optic nerves and
retrobulbar fat appears normal.
Visualized Paranasal sinuses and mastoid air cells: Clear.
Other structures: Visualized upper aerodigestive tract appears normal. Mandible
and bilateral temporomandibular joints appear normal.
IMPRESSION: 
IMPRESSION: 1. No facial bone fracture identified.
AM

------------- REPORT [id number] -------------
FINAL REPORT:
CT MAXILLOFACIAL WITHOUT CONTRAST
trauma: fall onto face
FINDINGS: Facial bones: No facial fracture is identified.
Pterygoid plates: Intact
Orbits: Unremarkable. Both globes, extraocular muscles, optic nerves and
retrobulbar fat appears normal.
Visualized Paranasal sinuses and mastoid air cells: Clear.
Other structures: Visualized upper aerodigestive tract appears normal. Mandible
and bilateral temporomandibular joints appear normal.
IMPRESSION: 
IMPRESSION: 1. No facial bone fracture identified.
AM

## 2024-06-17 IMAGING — CT CT CERVICAL SPINE WITHOUT CONTRAST
4 series · 16 of 33 positions shown, 19 images · non-contrast
Comparison: CT cervical spine from 07/22/2022

FINAL REPORT:
CT CERVICAL SPINE WITHOUT CONTRAST
Polytrauma, blunt
INDICATION: Polytrauma, blunt pain
Exam: CT of the cervical spine without IV contrast
All CT scans at this facility use iterative reconstruction technique, dose
modulation and/or weight based dosing when appropriate to reduce radiation dose
to as low as reasonably achievable.

[Series 9: c-spine stnd · axial · 0.35mm/px · z∈[-168,-101]mm · 3 of 82 slices shown]
[im 14/82  soft-tissue]
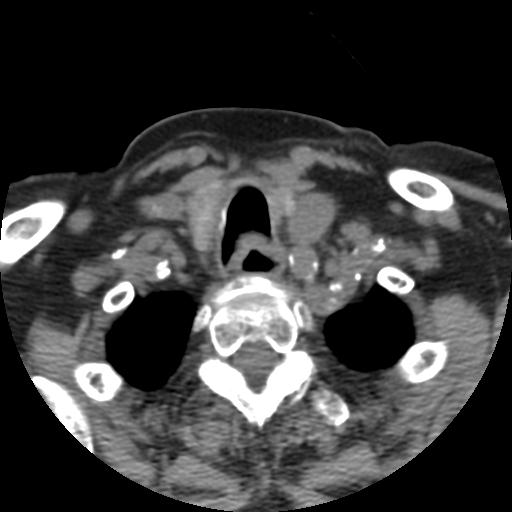
[im 28/82  soft-tissue]
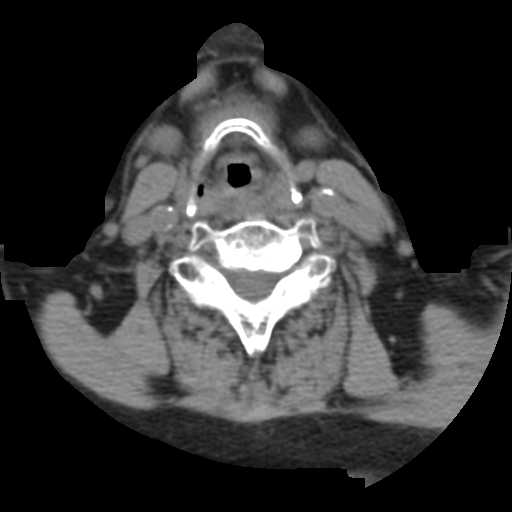
[im 41/82  soft-tissue]
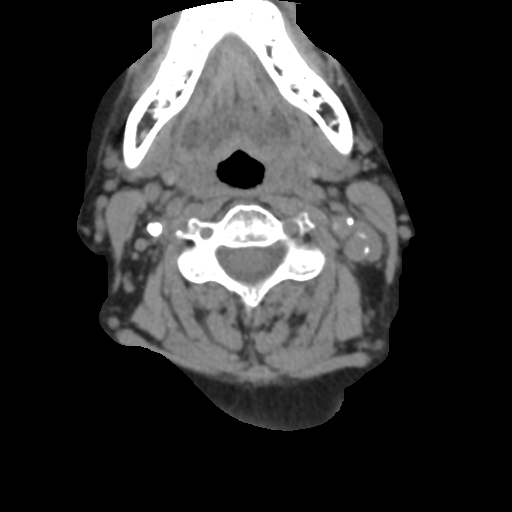

[Series 610: cor bone 2x2 · coronal · 0.40mm/px · 5 of 91 slices shown, 6 images]
[im 31/91  bone]
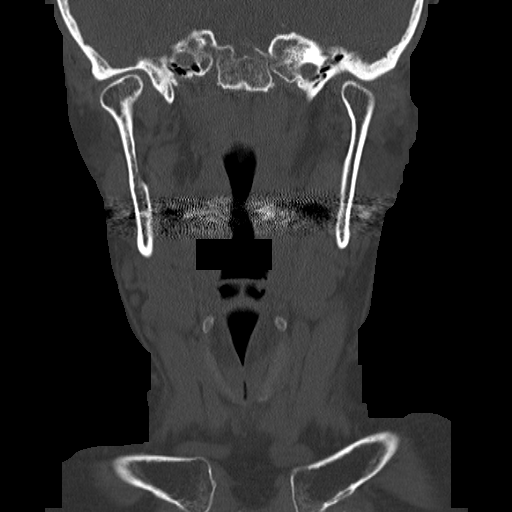
[im 38/91  bone]
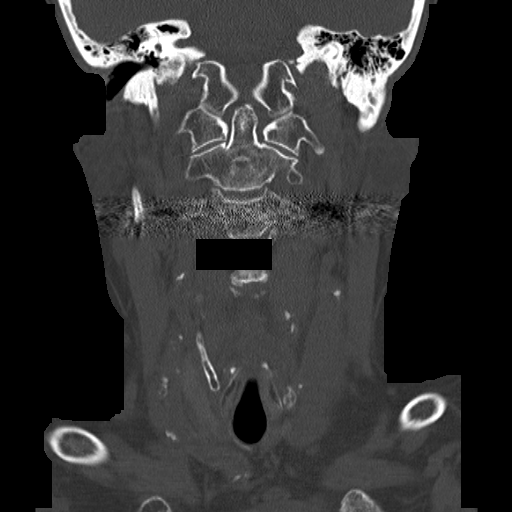
[im 46/91  soft-tissue]
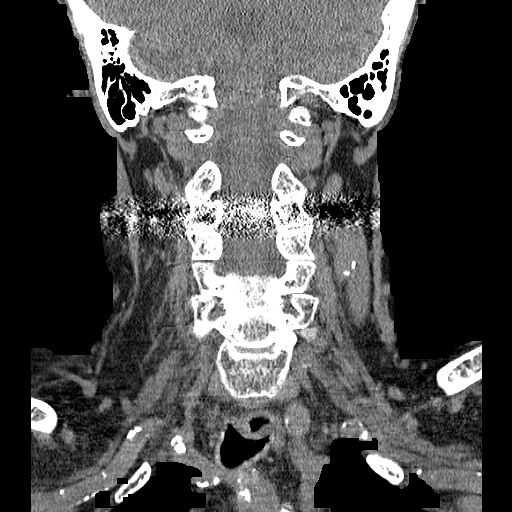
[im 46/91  bone]
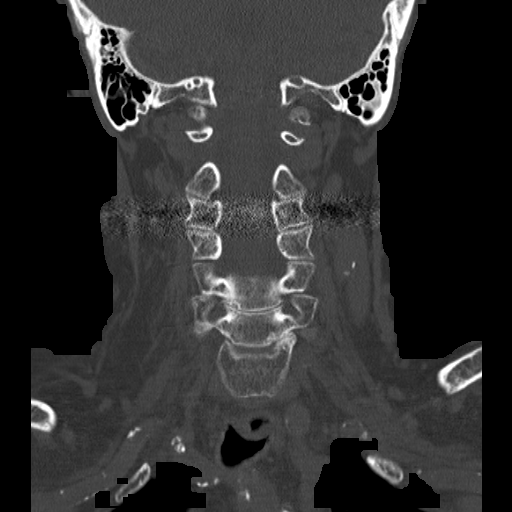
[im 53/91  bone]
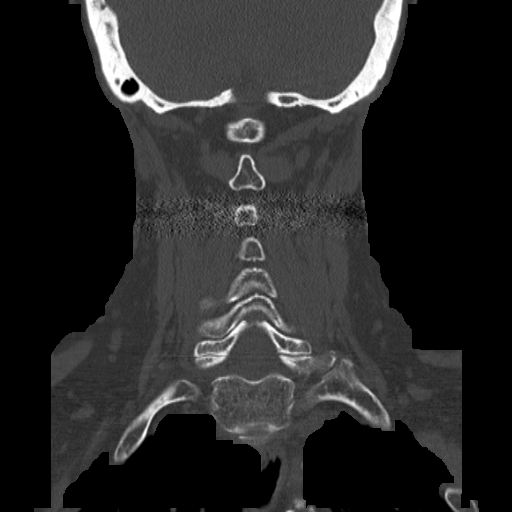
[im 61/91  bone]
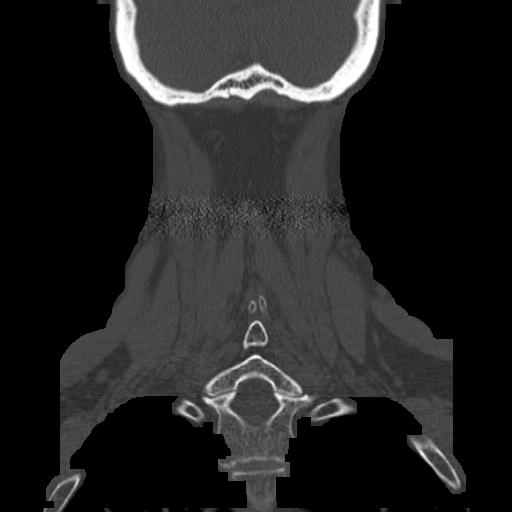

[Series 611: sag bone 2x2 · sagittal · 0.40mm/px · 3 of 91 slices shown]
[im 19/91  bone]
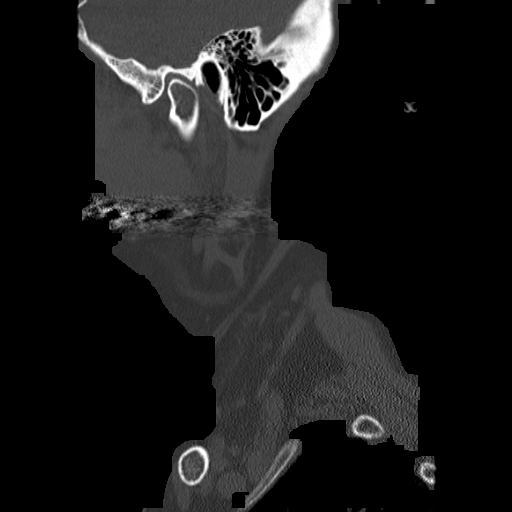
[im 37/91  bone]
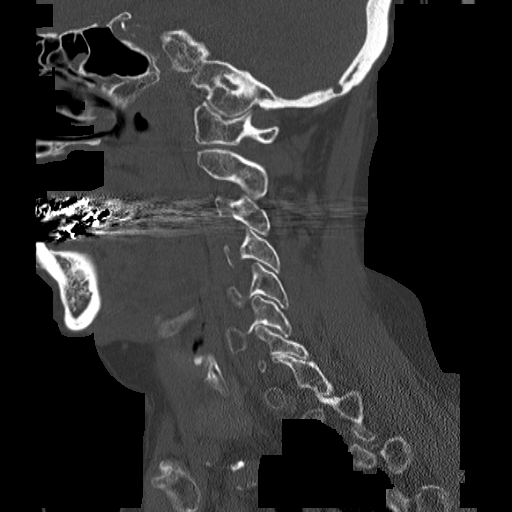
[im 55/91  bone]
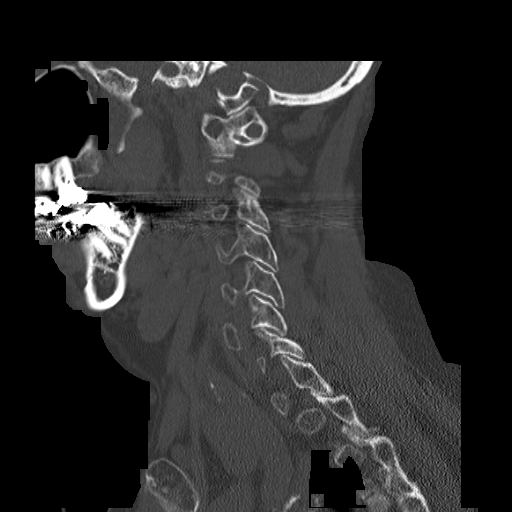

[Series 612: axial bone · axial · 0.35mm/px · z∈[-193,-56]mm · 5 of 87 slices shown, 7 images]
[im 15/87  soft-tissue]
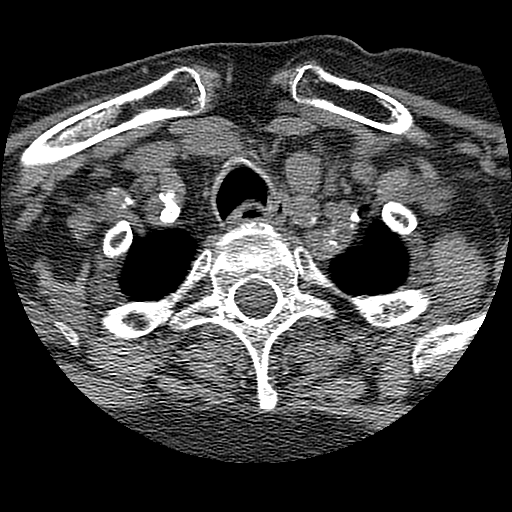
[im 15/87  bone]
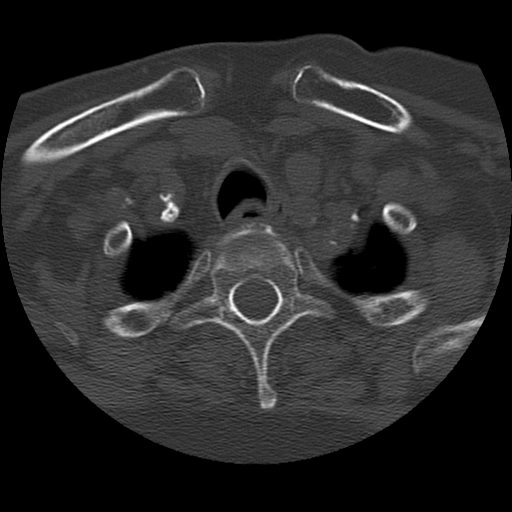
[im 29/87  bone]
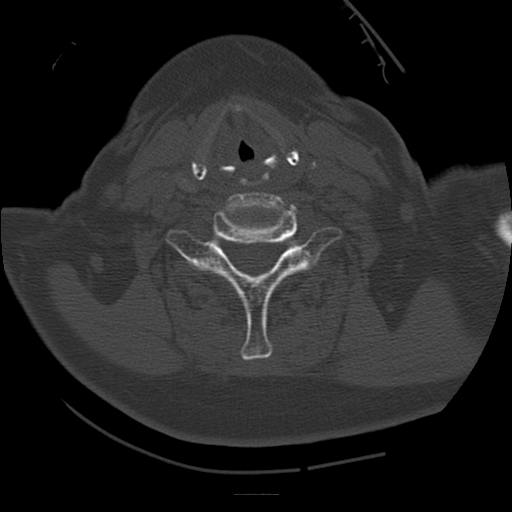
[im 44/87  bone]
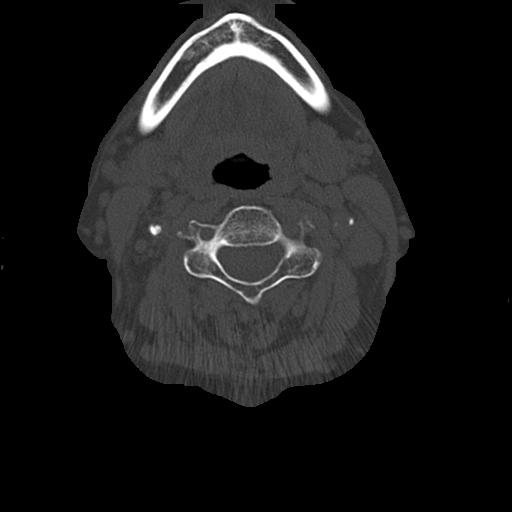
[im 58/87  bone]
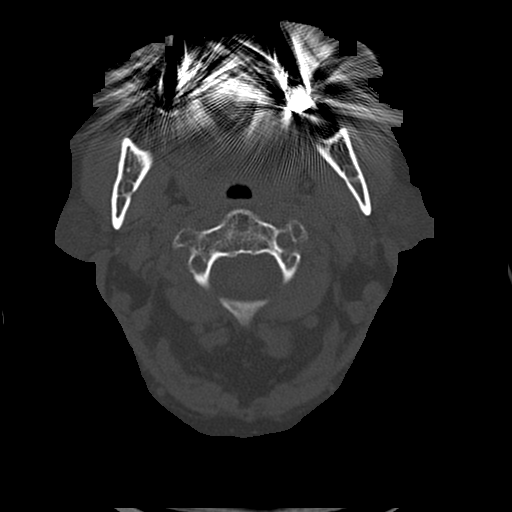
[im 72/87  soft-tissue]
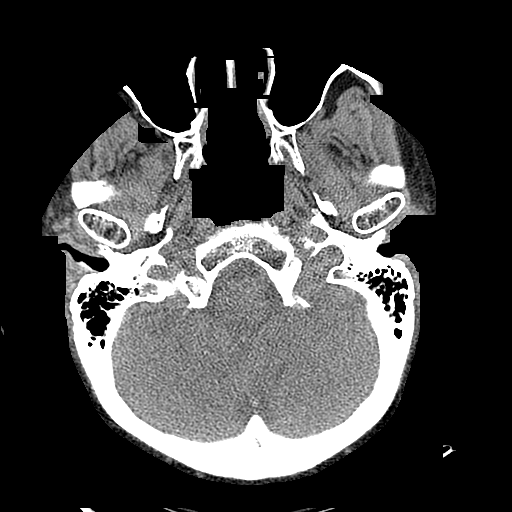
[im 72/87  bone]
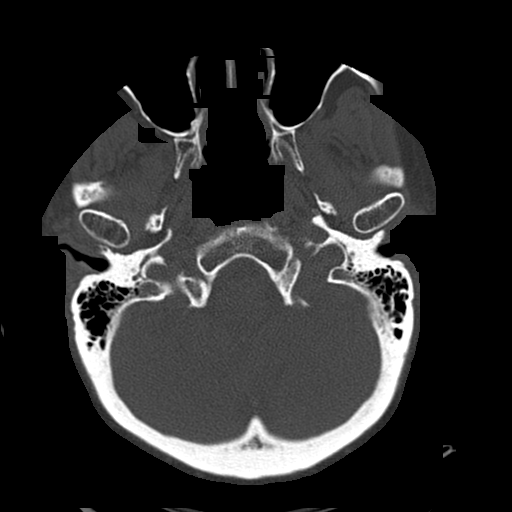

[16 of 33 positions shown; findings below may reference images not displayed]

FINDINGS: Cervical spine is in anatomic alignment without evidence of acute fracture or
traumatic subluxation. Cervical vertebral body height attenuation are within
normal limits. The atlantoodontoid interval is not widened. Prevertebral soft
tissues are unremarkable. No critical central canal stenosis is identified.
Visualized lung apices are unremarkable.
IMPRESSION: 
IMPRESSION: 1. No acute findings.
AM
